# Patient Record
Sex: Male | Born: 1955
Health system: Southern US, Community
[De-identification: ages and names within clinical notes are randomized; demographics above are authoritative.]

## PROBLEM LIST (undated history)

## (undated) DIAGNOSIS — M009 Pyogenic arthritis, unspecified: Secondary | ICD-10-CM

## (undated) DIAGNOSIS — J069 Acute upper respiratory infection, unspecified: Secondary | ICD-10-CM

## (undated) DIAGNOSIS — E119 Type 2 diabetes mellitus without complications: Secondary | ICD-10-CM

## (undated) DIAGNOSIS — K219 Gastro-esophageal reflux disease without esophagitis: Secondary | ICD-10-CM

## (undated) DIAGNOSIS — F419 Anxiety disorder, unspecified: Secondary | ICD-10-CM

## (undated) DIAGNOSIS — M5432 Sciatica, left side: Secondary | ICD-10-CM

## (undated) DIAGNOSIS — G4730 Sleep apnea, unspecified: Secondary | ICD-10-CM

## (undated) DIAGNOSIS — S838X9A Sprain of other specified parts of unspecified knee, initial encounter: Secondary | ICD-10-CM

## (undated) DIAGNOSIS — I10 Essential (primary) hypertension: Secondary | ICD-10-CM

## (undated) DIAGNOSIS — C801 Malignant (primary) neoplasm, unspecified: Secondary | ICD-10-CM

## (undated) DIAGNOSIS — B999 Unspecified infectious disease: Secondary | ICD-10-CM

## (undated) DIAGNOSIS — R739 Hyperglycemia, unspecified: Secondary | ICD-10-CM

## (undated) DIAGNOSIS — M1990 Unspecified osteoarthritis, unspecified site: Secondary | ICD-10-CM

## (undated) HISTORY — PX: TONSILLECTOMY: SUR1361

## (undated) HISTORY — DX: Unspecified osteoarthritis, unspecified site: M19.90

## (undated) HISTORY — DX: Anxiety disorder, unspecified: F41.9

## (undated) HISTORY — PX: SKIN GRAFT: SHX250

## (undated) HISTORY — DX: Essential (primary) hypertension: I10

## (undated) HISTORY — PX: HAND SURGERY: SHX662

## (undated) HISTORY — DX: Malignant (primary) neoplasm, unspecified: C80.1

## (undated) HISTORY — DX: Gastro-esophageal reflux disease without esophagitis: K21.9

## (undated) HISTORY — PX: HERNIA REPAIR: SHX51

---

## 1999-09-26 ENCOUNTER — Emergency Department (HOSPITAL_COMMUNITY): Admission: EM | Admit: 1999-09-26 | Discharge: 1999-09-26 | Payer: Self-pay | Admitting: Emergency Medicine

## 1999-10-02 ENCOUNTER — Emergency Department (HOSPITAL_COMMUNITY): Admission: EM | Admit: 1999-10-02 | Discharge: 1999-10-02 | Payer: Self-pay | Admitting: Emergency Medicine

## 2005-06-18 DIAGNOSIS — C801 Malignant (primary) neoplasm, unspecified: Secondary | ICD-10-CM

## 2005-06-18 HISTORY — DX: Malignant (primary) neoplasm, unspecified: C80.1

## 2005-11-20 ENCOUNTER — Ambulatory Visit (HOSPITAL_BASED_OUTPATIENT_CLINIC_OR_DEPARTMENT_OTHER): Admission: RE | Admit: 2005-11-20 | Discharge: 2005-11-20 | Payer: Self-pay | Admitting: Surgery

## 2008-05-28 ENCOUNTER — Ambulatory Visit (HOSPITAL_BASED_OUTPATIENT_CLINIC_OR_DEPARTMENT_OTHER): Admission: RE | Admit: 2008-05-28 | Discharge: 2008-05-28 | Payer: Self-pay | Admitting: Orthopedic Surgery
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
## 2010-11-03 NOTE — Op Note (Signed)
NAMECESAREO, VICKREY                  ACCOUNT NO.:  [id number]   MEDICAL RECORD NO.:  [id number]          PATIENT TYPE:  AMB   LOCATION:  DSC                          FACILITY:  [facility]   PHYSICIAN:  Wilmon Arms. Corliss Skains, M.D. DATE OF BIRTH:  [birth year]   DATE OF PROCEDURE:  11/20/2005  DATE OF DISCHARGE:                                 OPERATIVE REPORT   PREOPERATIVE DIAGNOSIS:  Right inguinal hernia.   POSTOPERATIVE DIAGNOSIS:  Right inguinal hernia.   PROCEDURE PERFORMED:  Right inguinal hernia repair with mesh.   SURGEON:  Wilmon Arms. Tsuei, M.D.   ANESTHESIA:  General via LMA.   INDICATIONS:  The patient is a 55 year old male who was diagnosed several  years ago with a right inguinal hernia.  This was asymptomatic at the time.  Recently he has become more symptomatic.  He has a visible bulge causing  some discomfort especially when he is at work lifting boxes.  He presents  for surgical repair.   DESCRIPTION OF PROCEDURE:  The patient is brought to the operating room and  placed in the supine position on the operating table.  After an adequate  level of general anesthesia was obtained, the patient's right groin was  shaved, prepped with Betadine, and draped in a sterile fashion.  The skin  above the inguinal ligament was infiltrated with 0.25% Marcaine.  An oblique  incision was made above the inguinal ligament and dissection was carried  down through the external oblique fascia.  The external oblique fascia was  opened along the direction of its fibers down to the external ring.  Blunt  dissection was used to dissect around the spermatic cord.  A Penrose drain  was used to encircle the cord.  There was no direct defect noted.  The  spermatic cord was skeletonized.  A large indirect hernia sac was dissected  free from the spermatic cord and reduced up through the internal ring.  The  internal ring was tightened with a single interrupted 2-0 Vicryl suture.  A  key-hole mesh of  Prolene was cut and secured beginning at the pubic tubercle  with interrupted 2-0 Vicryls.  This was attached to the internal oblique  muscle superiorly and the shelving edge inferiorly.  The tails were tucked  underneath the edge of the external oblique fascia laterally and the tails  were sutured together.  The wound was then irrigated and the external  oblique fascia was closed with 2-0 Vicryl after removing the Penrose drain.  A 3-0 Vicryl was used to close the subcutaneous fat and 4-0 Monocryl was  used to close the skin in subcuticular fashion.  Steri-Strips and a clean  dressing were applied.  The patient was then extubated and brought to the  recovery room in stable condition.  All sponge, instrument, and needle  counts were correct.     Wilmon Arms. Tsuei, M.D.  Electronically Signed    MKT/MEDQ  D:  11/20/2005  T:  11/20/2005  Job:  [id number]   cc:   Michelene Gardener, MD

## 2013-04-27 ENCOUNTER — Encounter (INDEPENDENT_AMBULATORY_CARE_PROVIDER_SITE_OTHER): Payer: Self-pay | Admitting: Surgery

## 2013-04-29 ENCOUNTER — Ambulatory Visit (INDEPENDENT_AMBULATORY_CARE_PROVIDER_SITE_OTHER): Payer: 59 | Admitting: Surgery

## 2013-04-29 ENCOUNTER — Encounter (INDEPENDENT_AMBULATORY_CARE_PROVIDER_SITE_OTHER): Payer: Self-pay

## 2013-04-29 ENCOUNTER — Encounter (INDEPENDENT_AMBULATORY_CARE_PROVIDER_SITE_OTHER): Payer: Self-pay | Admitting: Surgery

## 2013-04-29 VITALS — BP 138/86 | HR 72 | Temp 98.6°F | Resp 15 | Ht 70.0 in | Wt 195.2 lb

## 2013-04-29 DIAGNOSIS — R221 Localized swelling, mass and lump, neck: Secondary | ICD-10-CM

## 2013-04-29 DIAGNOSIS — N888 Other specified noninflammatory disorders of cervix uteri: Secondary | ICD-10-CM | POA: Insufficient documentation

## 2013-04-29 DIAGNOSIS — R220 Localized swelling, mass and lump, head: Secondary | ICD-10-CM

## 2013-04-29 NOTE — Progress Notes (Signed)
General Surgery [facility]  Chief Complaint  Patient presents with  . New Evaluation    eval lump on rt side neck - referral from [facility] Panda    HISTORY: Patient is a 57 year old male referred by his primary care physician for evaluation of a right posterior cervical mass. Patient states that this has been present for several months. It has not increased in size. It does not cause discomfort. He has had no prior lesions of this type. He has had no diagnostic studies performed.  Family history notable for lymphoma in the patient's mother.  Past Medical History  Diagnosis Date  . Anxiety   . GERD (gastroesophageal reflux disease)   . Arthritis   . Cancer     melanoma  . Hypertension     Current Outpatient Prescriptions  Medication Sig Dispense Refill  . Aspirin-Salicylamide-Caffeine (BC HEADACHE POWDER PO) Take by mouth.      . calcium & magnesium carbonates (MYLANTA) 311-232 MG per tablet Take 1 tablet by mouth daily.      . citalopram (CELEXA) 20 MG tablet Take 20 mg by mouth daily.      Marland Kitchen gabapentin (NEURONTIN) 300 MG capsule Take 300 mg by mouth 3 (three) times daily.      Marland Kitchen losartan (COZAAR) 50 MG tablet Take 50 mg by mouth daily.      . Potassium Gluconate 595 MG CAPS Take by mouth.      . sildenafil (VIAGRA) 100 MG tablet Take 100 mg by mouth daily as needed for erectile dysfunction.      . traMADol (ULTRAM) 50 MG tablet Take by mouth every 6 (six) hours as needed.       No current facility-administered medications for this visit.    No Known Allergies  Family History  Problem Relation Age of Onset  . Cancer Mother     lymphnode  . Stroke Father     History   Social History  . Marital Status: Married    Spouse Name: N/A    Number of Children: N/A  . Years of Education: N/A   Social History Main Topics  . Smoking status: Current Every Day Smoker -- 0.50 packs/day  . Smokeless tobacco: Never Used  . Alcohol Use: Yes     Comment:  pt said all he can  . Drug Use: No  . Sexual Activity: None   Other Topics Concern  . None   Social History Narrative  . None    REVIEW OF SYSTEMS - PERTINENT POSITIVES ONLY: Mass in right posterior neck is not painful. It has not changed in size. There has been no sign or symptom of infection. Patient denies any B-type symptoms such as night sweats or weight loss.  EXAM: Filed Vitals:   04/29/13 0932  BP: 138/86  Pulse: 72  Temp: 98.6 F (37 C)  Resp: 15    HEENT: normocephalic; pupils equal and reactive; sclerae clear; dentition good; mucous membranes moist NECK:  Thyroid without palpable abnormality;symmetric on extension; no palpable anterior cervical lymphadenopathy; no supraclavicular masses; no tenderness; palpable soft tissue mass right posterior cervical chain approximately 2 cm in greatest diameter, mobile, nontender CHEST: clear to auscultation bilaterally without rales, rhonchi, or wheezes CARDIAC: regular rate and rhythm without significant murmur; peripheral pulses are full EXT:  non-tender without edema; no deformity NEURO: no gross focal deficits; no sign of tremor   LABORATORY RESULTS: See [facility] (CHL-Epic) for most recent results  RADIOLOGY RESULTS: See [facility] (CHL-Epic) for most recent results  IMPRESSION: Right posterior cervical soft tissue mass, 2 cm, possible abnormal lymph node  PLAN: I discussed the above findings at length with the patient and his significant other. I have recommended an ultrasound of the neck to evaluate the posterior cervical mass as well as the anterior neck and thyroid. I will contact him with the results of the study.  If the mass in question appears to be a lymph node, then I think it should be excised for definitive diagnosis. This can be performed as an outpatient surgical procedure. If this appears to be a lipoma or a subcutaneous cyst, then I think he can be observed as the patient is  asymptomatic.  We will arrange for the ultrasound study and contact the patient with the results.  Velora Heckler, MD, FACS General & Endocrine Surgery [facility]  Primary Care Physician: No primary provider on file.

## 2013-04-29 NOTE — Patient Instructions (Signed)
Lymphadenopathy °Lymphadenopathy means "disease of the lymph glands." But the term is usually used to describe swollen or enlarged lymph glands, also called lymph nodes. These are the bean-shaped organs found in many locations including the neck, underarm, and groin. Lymph glands are part of the immune system, which fights infections in your body. Lymphadenopathy can occur in just one area of the body, such as the neck, or it can be generalized, with lymph node enlargement in several areas. The nodes found in the neck are the most common sites of lymphadenopathy. °CAUSES  °When your immune system responds to germs (such as viruses or bacteria ), infection-fighting cells and fluid build up. This causes the glands to grow in size. This is usually not something to worry about. Sometimes, the glands themselves can become infected and inflamed. This is called lymphadenitis. °Enlarged lymph nodes can be caused by many diseases: °· Bacterial disease, such as strep throat or a skin infection. °· Viral disease, such as a common cold. °· Other germs, such as lyme disease, tuberculosis, or sexually transmitted diseases. °· Cancers, such as lymphoma (cancer of the lymphatic system) or leukemia (cancer of the white blood cells). °· Inflammatory diseases such as lupus or rheumatoid arthritis. °· Reactions to medications. °Many of the diseases above are rare, but important. This is why you should see your caregiver if you have lymphadenopathy. °SYMPTOMS  °· Swollen, enlarged lumps in the neck, back of the head or other locations. °· Tenderness. °· Warmth or redness of the skin over the lymph nodes. °· Fever. °DIAGNOSIS  °Enlarged lymph nodes are often near the source of infection. They can help healthcare providers diagnose your illness. For instance:  °· Swollen lymph nodes around the jaw might be caused by an infection in the mouth. °· Enlarged glands in the neck often signal a throat infection. °· Lymph nodes that are swollen  in more than one area often indicate an illness caused by a virus. °Your caregiver most likely will know what is causing your lymphadenopathy after listening to your history and examining you. Blood tests, x-rays or other tests may be needed. If the cause of the enlarged lymph node cannot be found, and it does not go away by itself, then a biopsy may be needed. Your caregiver will discuss this with you. °TREATMENT  °Treatment for your enlarged lymph nodes will depend on the cause. Many times the nodes will shrink to normal size by themselves, with no treatment. Antibiotics or other medicines may be needed for infection. Only take over-the-counter or prescription medicines for pain, discomfort or fever as directed by your caregiver. °HOME CARE INSTRUCTIONS  °Swollen lymph glands usually return to normal when the underlying medical condition goes away. If they persist, contact your health-care provider. He/she might prescribe antibiotics or other treatments, depending on the diagnosis. Take any medications exactly as prescribed. Keep any follow-up appointments made to check on the condition of your enlarged nodes.  °SEEK MEDICAL CARE IF:  °· Swelling lasts for more than two weeks. °· You have symptoms such as weight loss, night sweats, fatigue or fever that does not go away. °· The lymph nodes are hard, seem fixed to the skin or are growing rapidly. °· Skin over the lymph nodes is red and inflamed. This could mean there is an infection. °SEEK IMMEDIATE MEDICAL CARE IF:  °· Fluid starts leaking from the area of the enlarged lymph node. °· You develop a fever of 102° F (38.9° C) or greater. °· Severe   pain develops (not necessarily at the site of a large lymph node). °· You develop chest pain or shortness of breath. °· You develop worsening abdominal pain. °MAKE SURE YOU:  °· Understand these instructions. °· Will watch your condition. °· Will get help right away if you are not doing well or get worse. °Document  Released: 03/13/2008 Document Revised: 08/27/2011 Document Reviewed: 03/13/2008 °[employer]® Patient Information ©2014 [employer]. ° °

## 2013-04-30 ENCOUNTER — Ambulatory Visit
Admission: RE | Admit: 2013-04-30 | Discharge: 2013-04-30 | Disposition: A | Discharge status: Home / Self Care | Payer: 59 | Source: Ambulatory Visit | Attending: Surgery | Admitting: Surgery

## 2013-04-30 DIAGNOSIS — R221 Localized swelling, mass and lump, neck: Secondary | ICD-10-CM

## 2013-05-03 ENCOUNTER — Encounter (INDEPENDENT_AMBULATORY_CARE_PROVIDER_SITE_OTHER): Payer: Self-pay | Admitting: Surgery

## 2013-05-04 NOTE — Progress Notes (Signed)
Dr. Gerrit Friends - please enter preop orders in Epic -thanks.

## 2013-05-05 ENCOUNTER — Other Ambulatory Visit (INDEPENDENT_AMBULATORY_CARE_PROVIDER_SITE_OTHER): Payer: Self-pay | Admitting: Surgery

## 2013-05-12 ENCOUNTER — Telehealth (INDEPENDENT_AMBULATORY_CARE_PROVIDER_SITE_OTHER): Payer: Self-pay

## 2013-05-12 NOTE — Telephone Encounter (Signed)
I notified the pt.

## 2013-05-12 NOTE — Telephone Encounter (Signed)
OK with me.  Shouldn't have any effect on neck procedure.  Velora Heckler, MD, [facility] Office: [phone]

## 2013-05-12 NOTE — Telephone Encounter (Signed)
The pt called to see if he can get steroid injections in his back around the same time of his surgery 12/5.  Dr Gerrit Friends is removing a mass from his neck that day.  He said he can get the injections the day before or same day if ok with Dr Gerrit Friends.  You can leave him a message at [phone] if he doesn't pick up.

## 2013-05-13 ENCOUNTER — Encounter (HOSPITAL_COMMUNITY): Payer: Self-pay | Admitting: Pharmacy Technician

## 2013-05-13 ENCOUNTER — Other Ambulatory Visit (HOSPITAL_COMMUNITY): Payer: Self-pay | Admitting: *Deleted

## 2013-05-17 DIAGNOSIS — J069 Acute upper respiratory infection, unspecified: Secondary | ICD-10-CM

## 2013-05-17 HISTORY — DX: Acute upper respiratory infection, unspecified: J06.9

## 2013-05-18 ENCOUNTER — Inpatient Hospital Stay (HOSPITAL_COMMUNITY): Admission: RE | Admit: 2013-05-18 | Payer: 59 | Source: Ambulatory Visit

## 2013-05-18 ENCOUNTER — Telehealth (INDEPENDENT_AMBULATORY_CARE_PROVIDER_SITE_OTHER): Payer: Self-pay | Admitting: *Deleted

## 2013-05-18 NOTE — Telephone Encounter (Signed)
Pt called stating that he woke up yesterday with a sinus infection and went to the doctor and is currently taking amoxicillin and a decongestant.  He is not feeling any better yet so he is concerned and wondering if his surgery scheduled for 12/5 with Dr. Gerrit Friends should be rescheduled.  I informed him that I would check with Dr. Gerrit Friends and we would get back in touch with him.  Please advise.

## 2013-05-19 NOTE — Telephone Encounter (Signed)
If patient on abx, should be OK for surgery by Friday 12/5.  Tell patient to come as scheduled and anesthesia will assess.  Velora Heckler, MD, [facility] Office: [phone]

## 2013-05-19 NOTE — Telephone Encounter (Signed)
Spoke with pt and informed him that surgery is OK and still scheduled for 12/5.  I also called pre-op at [facility] and they will be contacting pt today to rescheduled his pre-op testing since he missed his appt yesterday.  Pt is agreeable with this plan and is feeling much better today!

## 2013-05-20 NOTE — Patient Instructions (Addendum)
20 Adam Tyler  05/20/2013   Your procedure is scheduled on:  05/22/13 FRIDAY  Report to [facility] at   0930    AM.  Call this number if you have problems the morning of surgery: [phone]       Remember:   Do not eat food  Or drink :After Midnight. TONIGHT   Take these medicines the morning of surgery with A SIP OF WATER: Citalopram, Gabapentin                                                                     May take Tramadol if needed   .  Contacts, dentures or partial plates can not be worn to surgery  Leave suitcase in the car. After surgery it may be brought to your room.  For patients admitted to the hospital, checkout time is 11:00 AM day of  discharge.             SPECIAL INSTRUCTIONS- SEE [place] PREPARING FOR SURGERY INSTRUCTION SHEET-     DO NOT WEAR JEWELRY, LOTIONS, POWDERS, OR PERFUMES.  WOMEN-- DO NOT SHAVE LEGS OR UNDERARMS FOR 12 HOURS BEFORE SHOWERS. MEN MAY SHAVE FACE.  Patients discharged the day of surgery will not be allowed to drive home. IF going home the day of surgery, you must have a driver and someone to stay with you for the first 24 hours  Name and phone number of your driver:   Delman Cheadle  PST [phone]  [phone]                 FAILURE TO FOLLOW THESE INSTRUCTIONS MAY RESULT IN  CANCELLATION   OF YOUR SURGERY                                                  Patient Signature _____________________________

## 2013-05-21 ENCOUNTER — Encounter (HOSPITAL_COMMUNITY)
Admission: RE | Admit: 2013-05-21 | Discharge: 2013-05-21 | Disposition: A | Discharge status: Home / Self Care | Payer: 59 | Source: Ambulatory Visit | Attending: Surgery | Admitting: Surgery

## 2013-05-21 ENCOUNTER — Encounter (HOSPITAL_COMMUNITY): Payer: Self-pay

## 2013-05-21 ENCOUNTER — Ambulatory Visit (HOSPITAL_COMMUNITY)
Admission: RE | Admit: 2013-05-21 | Discharge: 2013-05-21 | Disposition: A | Discharge status: Home / Self Care | Payer: 59 | Source: Ambulatory Visit | Attending: Surgery | Admitting: Surgery

## 2013-05-21 ENCOUNTER — Encounter (HOSPITAL_COMMUNITY): Payer: Self-pay | Admitting: Pharmacy Technician

## 2013-05-21 DIAGNOSIS — Z01812 Encounter for preprocedural laboratory examination: Secondary | ICD-10-CM | POA: Insufficient documentation

## 2013-05-21 DIAGNOSIS — Z01810 Encounter for preprocedural cardiovascular examination: Secondary | ICD-10-CM | POA: Insufficient documentation

## 2013-05-21 DIAGNOSIS — Z01818 Encounter for other preprocedural examination: Secondary | ICD-10-CM | POA: Insufficient documentation

## 2013-05-21 HISTORY — DX: Sciatica, left side: M54.32

## 2013-05-21 HISTORY — DX: Acute upper respiratory infection, unspecified: J06.9

## 2013-05-21 LAB — CBC
HCT: 43.7 % (ref 39.0–52.0)
MCH: 30.7 pg (ref 26.0–34.0)
MCV: 89.9 fL (ref 78.0–100.0)
RBC: 4.86 MIL/uL (ref 4.22–5.81)
WBC: 8.1 10*3/uL (ref 4.0–10.5)

## 2013-05-21 LAB — BASIC METABOLIC PANEL
CO2: 25 mEq/L (ref 19–32)
Chloride: 100 mEq/L (ref 96–112)
Potassium: 4.4 mEq/L (ref 3.5–5.1)
Sodium: 136 mEq/L (ref 135–145)

## 2013-05-21 NOTE — Progress Notes (Signed)
Quick Note:  These results are acceptable for scheduled surgery.  [name], MD, FACS [facility] Office: [phone]   ______ 

## 2013-05-21 NOTE — Progress Notes (Signed)
Patient stated at PST visit has been on Amoxicillin and URI is improving- states no fever since mONDAY

## 2013-05-22 ENCOUNTER — Ambulatory Visit (HOSPITAL_COMMUNITY): Admission: RE | Admit: 2013-05-22 | Payer: 59 | Source: Ambulatory Visit | Admitting: Surgery

## 2013-05-22 ENCOUNTER — Encounter (HOSPITAL_COMMUNITY): Admission: RE | Payer: Self-pay | Source: Ambulatory Visit

## 2013-05-22 SURGERY — THYROIDECTOMY
Anesthesia: General | Laterality: Right

## 2013-05-29 NOTE — Progress Notes (Signed)
Spoke with patient by phone and patient still has preop instructions for surgery that was scheduled for 05/22/13.  Instructed patient to arrive at 0900am on 06/19/2013.  Surgery will be at 1130am.  Report to Short Stay and bring Insurance Card and Picture ID.  Patient aware to follow hibiclens instructions for 12/31 pm and 06/18/2013 pm prior to surgery.  Patient aware to follow preop instructions previously given regarding npo after midnite and what medications to take am of surgery.  Patient voiced understanding.  Patient stated medications nor medical history have changed since preop appointment of 05/21/13.

## 2013-06-05 ENCOUNTER — Other Ambulatory Visit: Payer: Self-pay | Admitting: Neurosurgery

## 2013-06-05 DIAGNOSIS — M5416 Radiculopathy, lumbar region: Secondary | ICD-10-CM

## 2013-06-15 ENCOUNTER — Ambulatory Visit
Admission: RE | Admit: 2013-06-15 | Discharge: 2013-06-15 | Disposition: A | Discharge status: Home / Self Care | Payer: 59 | Source: Ambulatory Visit | Attending: Neurosurgery | Admitting: Neurosurgery

## 2013-06-15 VITALS — BP 127/80 | HR 72

## 2013-06-15 DIAGNOSIS — M5416 Radiculopathy, lumbar region: Secondary | ICD-10-CM

## 2013-06-15 DIAGNOSIS — N888 Other specified noninflammatory disorders of cervix uteri: Secondary | ICD-10-CM

## 2013-06-15 MED ORDER — IOHEXOL 180 MG/ML  SOLN
18.0000 mL | Freq: Once | INTRAMUSCULAR | Status: AC | PRN
  Administered 2013-06-15: 18 mL via INTRATHECAL

## 2013-06-15 MED ORDER — ONDANSETRON HCL 4 MG/2ML IJ SOLN: 4.0000 mg | Freq: Four times a day (QID) | INTRAMUSCULAR | Status: DC | PRN

## 2013-06-15 MED ORDER — DIAZEPAM 5 MG PO TABS
10.0000 mg | ORAL_TABLET | Freq: Once | ORAL | Status: AC
  Administered 2013-06-15: 10 mg via ORAL

## 2013-06-15 NOTE — Progress Notes (Signed)
Pt states he has been off celexa and tramadol for the past 2 days. Discharge instructions explained to pt and his wife

## 2013-06-17 ENCOUNTER — Encounter (HOSPITAL_COMMUNITY): Payer: Self-pay | Admitting: Pharmacy Technician

## 2013-06-19 ENCOUNTER — Encounter (HOSPITAL_COMMUNITY)
Admission: RE | Disposition: A | Discharge status: Home / Self Care | Payer: Self-pay | Source: Ambulatory Visit | Attending: Surgery

## 2013-06-19 ENCOUNTER — Encounter (HOSPITAL_COMMUNITY): Payer: 59 | Admitting: Certified Registered Nurse Anesthetist

## 2013-06-19 ENCOUNTER — Ambulatory Visit (HOSPITAL_COMMUNITY): Payer: 59 | Admitting: Certified Registered Nurse Anesthetist

## 2013-06-19 ENCOUNTER — Ambulatory Visit (HOSPITAL_COMMUNITY)
Admission: RE | Admit: 2013-06-19 | Discharge: 2013-06-19 | Disposition: A | Discharge status: Home / Self Care | Payer: 59 | Source: Ambulatory Visit | Attending: Surgery | Admitting: Surgery

## 2013-06-19 ENCOUNTER — Encounter (HOSPITAL_COMMUNITY): Payer: Self-pay | Admitting: *Deleted

## 2013-06-19 DIAGNOSIS — I10 Essential (primary) hypertension: Secondary | ICD-10-CM | POA: Insufficient documentation

## 2013-06-19 DIAGNOSIS — Z79899 Other long term (current) drug therapy: Secondary | ICD-10-CM | POA: Insufficient documentation

## 2013-06-19 DIAGNOSIS — N888 Other specified noninflammatory disorders of cervix uteri: Secondary | ICD-10-CM | POA: Diagnosis present

## 2013-06-19 DIAGNOSIS — Z85820 Personal history of malignant melanoma of skin: Secondary | ICD-10-CM | POA: Insufficient documentation

## 2013-06-19 DIAGNOSIS — Z7982 Long term (current) use of aspirin: Secondary | ICD-10-CM | POA: Insufficient documentation

## 2013-06-19 DIAGNOSIS — R599 Enlarged lymph nodes, unspecified: Secondary | ICD-10-CM | POA: Insufficient documentation

## 2013-06-19 DIAGNOSIS — D1779 Benign lipomatous neoplasm of other sites: Secondary | ICD-10-CM | POA: Insufficient documentation

## 2013-06-19 DIAGNOSIS — D1739 Benign lipomatous neoplasm of skin and subcutaneous tissue of other sites: Secondary | ICD-10-CM

## 2013-06-19 DIAGNOSIS — F17200 Nicotine dependence, unspecified, uncomplicated: Secondary | ICD-10-CM | POA: Insufficient documentation

## 2013-06-19 DIAGNOSIS — K219 Gastro-esophageal reflux disease without esophagitis: Secondary | ICD-10-CM | POA: Insufficient documentation

## 2013-06-19 HISTORY — PX: MASS EXCISION: SHX2000

## 2013-06-19 SURGERY — EXCISION MASS
Anesthesia: General | Site: Neck | Laterality: Right

## 2013-06-19 MED ORDER — MIDAZOLAM HCL 2 MG/2ML IJ SOLN
INTRAMUSCULAR | Status: AC
  Filled 2013-06-19: qty 2

## 2013-06-19 MED ORDER — LACTATED RINGERS IV SOLN
INTRAVENOUS | Status: DC
Start: 2013-06-19 — End: 2013-06-19
  Administered 2013-06-19: 12:00:00 via INTRAVENOUS

## 2013-06-19 MED ORDER — FENTANYL CITRATE 0.05 MG/ML IJ SOLN
INTRAMUSCULAR | Status: DC | PRN
  Administered 2013-06-19 (×2): 50 ug via INTRAVENOUS

## 2013-06-19 MED ORDER — ONDANSETRON HCL 4 MG/2ML IJ SOLN
INTRAMUSCULAR | Status: AC
  Filled 2013-06-19: qty 2

## 2013-06-19 MED ORDER — PROPOFOL 10 MG/ML IV BOLUS
INTRAVENOUS | Status: AC
  Filled 2013-06-19: qty 20

## 2013-06-19 MED ORDER — CEFAZOLIN SODIUM-DEXTROSE 2-3 GM-% IV SOLR
2.0000 g | INTRAVENOUS | Status: AC
  Administered 2013-06-19: 2 g via INTRAVENOUS

## 2013-06-19 MED ORDER — OXYCODONE-ACETAMINOPHEN 5-325 MG PO TABS: 1.0000 | ORAL_TABLET | ORAL | Status: DC | PRN

## 2013-06-19 MED ORDER — HYDROMORPHONE HCL PF 1 MG/ML IJ SOLN
0.2500 mg | INTRAMUSCULAR | Status: DC | PRN
  Administered 2013-06-19 (×2): 0.5 mg via INTRAVENOUS

## 2013-06-19 MED ORDER — LIDOCAINE HCL (PF) 2 % IJ SOLN
INTRAMUSCULAR | Status: DC | PRN
  Administered 2013-06-19: 80 mg via INTRADERMAL

## 2013-06-19 MED ORDER — HYDROMORPHONE HCL PF 1 MG/ML IJ SOLN
INTRAMUSCULAR | Status: AC
  Filled 2013-06-19: qty 1

## 2013-06-19 MED ORDER — FENTANYL CITRATE 0.05 MG/ML IJ SOLN: 25.0000 ug | INTRAMUSCULAR | Status: DC | PRN

## 2013-06-19 MED ORDER — PROPOFOL 10 MG/ML IV BOLUS
INTRAVENOUS | Status: DC | PRN
  Administered 2013-06-19: 200 mg via INTRAVENOUS

## 2013-06-19 MED ORDER — ONDANSETRON HCL 4 MG/2ML IJ SOLN
INTRAMUSCULAR | Status: DC | PRN
  Administered 2013-06-19: 4 mg via INTRAVENOUS

## 2013-06-19 MED ORDER — PROMETHAZINE HCL 25 MG/ML IJ SOLN: 6.2500 mg | INTRAMUSCULAR | Status: DC | PRN

## 2013-06-19 MED ORDER — 0.9 % SODIUM CHLORIDE (POUR BTL) OPTIME
TOPICAL | Status: DC | PRN
  Administered 2013-06-19: 1000 mL

## 2013-06-19 MED ORDER — FENTANYL CITRATE 0.05 MG/ML IJ SOLN
INTRAMUSCULAR | Status: AC
  Filled 2013-06-19: qty 5

## 2013-06-19 MED ORDER — CEFAZOLIN SODIUM-DEXTROSE 2-3 GM-% IV SOLR
INTRAVENOUS | Status: AC
  Filled 2013-06-19: qty 50

## 2013-06-19 MED ORDER — MIDAZOLAM HCL 5 MG/5ML IJ SOLN
INTRAMUSCULAR | Status: DC | PRN
  Administered 2013-06-19: 2 mg via INTRAVENOUS

## 2013-06-19 MED ORDER — SUCCINYLCHOLINE CHLORIDE 20 MG/ML IJ SOLN
INTRAMUSCULAR | Status: DC | PRN
  Administered 2013-06-19: 100 mg via INTRAVENOUS

## 2013-06-19 MED ORDER — LIDOCAINE HCL (CARDIAC) 20 MG/ML IV SOLN
INTRAVENOUS | Status: AC
  Filled 2013-06-19: qty 5

## 2013-06-19 SURGICAL SUPPLY — 40 items
ADH SKN CLS APL DERMABOND .7 (GAUZE/BANDAGES/DRESSINGS) ×1
APL SKNCLS STERI-STRIP NONHPOA (GAUZE/BANDAGES/DRESSINGS) ×1
ATTRACTOMAT 16X20 MAGNETIC DRP (DRAPES) ×2 IMPLANT
BENZOIN TINCTURE PRP APPL 2/3 (GAUZE/BANDAGES/DRESSINGS) ×2 IMPLANT
BLADE HEX COATED 2.75 (ELECTRODE) ×2 IMPLANT
BLADE SURG 15 STRL LF DISP TIS (BLADE) ×1 IMPLANT
BLADE SURG 15 STRL SS (BLADE) ×2
CANISTER SUCTION 2500CC (MISCELLANEOUS) ×2 IMPLANT
CHLORAPREP W/TINT 10.5 ML (MISCELLANEOUS) ×2 IMPLANT
CLIP TI MEDIUM 6 (CLIP) ×2 IMPLANT
CLIP TI WIDE RED SMALL 6 (CLIP) ×2 IMPLANT
DERMABOND ADVANCED (GAUZE/BANDAGES/DRESSINGS) ×1
DERMABOND ADVANCED .7 DNX12 (GAUZE/BANDAGES/DRESSINGS) IMPLANT
DISSECTOR ROUND CHERRY 3/8 STR (MISCELLANEOUS) IMPLANT
DRAPE PED LAPAROTOMY (DRAPES) ×2 IMPLANT
DRESSING SURGICEL FIBRLLR 1X2 (HEMOSTASIS) ×1 IMPLANT
DRSG SURGICEL FIBRILLAR 1X2 (HEMOSTASIS)
ELECT REM PT RETURN 9FT ADLT (ELECTROSURGICAL) ×2
ELECTRODE REM PT RTRN 9FT ADLT (ELECTROSURGICAL) ×1 IMPLANT
GAUZE SPONGE 4X4 16PLY XRAY LF (GAUZE/BANDAGES/DRESSINGS) ×2 IMPLANT
GLOVE SURG ORTHO 8.0 STRL STRW (GLOVE) ×2 IMPLANT
GOWN PREVENTION PLUS LG XLONG (DISPOSABLE) ×2 IMPLANT
GOWN STRL REIN XL XLG (GOWN DISPOSABLE) ×4 IMPLANT
KIT BASIN OR (CUSTOM PROCEDURE TRAY) ×2 IMPLANT
NS IRRIG 1000ML POUR BTL (IV SOLUTION) ×2 IMPLANT
PACK BASIC VI WITH GOWN DISP (CUSTOM PROCEDURE TRAY) ×2 IMPLANT
PENCIL BUTTON HOLSTER BLD 10FT (ELECTRODE) ×2 IMPLANT
SHEARS HARMONIC 9CM CVD (BLADE) ×1 IMPLANT
SPONGE GAUZE 4X4 12PLY (GAUZE/BANDAGES/DRESSINGS) IMPLANT
STAPLER VISISTAT 35W (STAPLE) ×1 IMPLANT
STRIP CLOSURE SKIN 1/2X4 (GAUZE/BANDAGES/DRESSINGS) ×2 IMPLANT
SUT MNCRL AB 4-0 PS2 18 (SUTURE) ×2 IMPLANT
SUT SILK 2 0 (SUTURE) ×2
SUT SILK 2-0 18XBRD TIE 12 (SUTURE) ×1 IMPLANT
SUT SILK 3 0 (SUTURE)
SUT SILK 3-0 18XBRD TIE 12 (SUTURE) IMPLANT
SUT VIC AB 3-0 SH 18 (SUTURE) ×2 IMPLANT
SYR BULB IRRIGATION 50ML (SYRINGE) ×2 IMPLANT
TOWEL OR 17X26 10 PK STRL BLUE (TOWEL DISPOSABLE) ×2 IMPLANT
YANKAUER SUCT BULB TIP 10FT TU (MISCELLANEOUS) ×2 IMPLANT

## 2013-06-19 NOTE — Anesthesia Postprocedure Evaluation (Signed)
  Anesthesia Post-op Note  Patient: Adam Tyler  Procedure(s) Performed: Procedure(s) (LRB): EXCISION MASS RIGHT POSTERIOR NECK (Right)  Patient Location: PACU  Anesthesia Type: General  Level of Consciousness: awake and alert   Airway and Oxygen Therapy: Patient Spontanous Breathing  Post-op Pain: mild  Post-op Assessment: Post-op Vital signs reviewed, Patient's Cardiovascular Status Stable, Respiratory Function Stable, Patent Airway and No signs of Nausea or vomiting  Last Vitals:  Filed Vitals:   06/19/13 1430  BP: 124/63  Pulse: 77  Temp: 36.4 C  Resp: 13    Post-op Vital Signs: stable   Complications: No apparent anesthesia complications

## 2013-06-19 NOTE — H&P (Signed)
Adam Tyler is an 58 y.o. male.    General Surgery [facility]  Chief Complaint: right posterior cervical mass  HPI: Patient is a 58 year old male referred by his primary care physician for evaluation of a right posterior cervical mass. Patient states that this has been present for several months. It has not increased in size. It does not cause discomfort. He has had no prior lesions of this type. He has had no diagnostic studies performed. Patient now presents for excision for definitive diagnosis.  Past Medical History  Diagnosis Date  . GERD (gastroesophageal reflux disease)   . Arthritis   . Hypertension   . Upper respiratory infection 05/17/13    no fever since 05/18/13  . Cancer 2007    melanoma forehead   . Anxiety     h/o panic attacks from " stress"  . Sciatica of left side     per pt  "buldging disc- lumbar"    Past Surgical History  Procedure Laterality Date  . Hernia repair    . Tonsillectomy    . Hand surgery    . Skin graft Right     Family History  Problem Relation Age of Onset  . Cancer Mother     lymphnode  . Stroke Father    Social History:  reports that he has been smoking.  He has never used smokeless tobacco. He reports that he drinks alcohol. He reports that he does not use illicit drugs.  Allergies: No Known Allergies  Medications Prior to Admission  Medication Sig Dispense Refill  . citalopram (CELEXA) 20 MG tablet Take 20 mg by mouth every morning.       Marland Kitchen ibuprofen (ADVIL,MOTRIN) 200 MG tablet Take 600 mg by mouth every 6 (six) hours as needed for mild pain or moderate pain.      Marland Kitchen losartan-hydrochlorothiazide (HYZAAR) 50-12.5 MG per tablet Take 1 tablet by mouth every morning.      Marland Kitchen oxymetazoline (AFRIN) 0.05 % nasal spray Place 1 spray into both nostrils 2 (two) times daily as needed for congestion.      . Potassium Gluconate 595 MG CAPS Take 1 capsule by mouth every evening.       . traMADol (ULTRAM) 50 MG tablet Take  50 mg by mouth every 6 (six) hours as needed for moderate pain or severe pain.       . Aspirin-Salicylamide-Caffeine (BC HEADACHE POWDER PO) Take 1 each by mouth daily as needed (headache).       Marland Kitchen dextromethorphan-guaiFENesin (MUCINEX DM) 30-600 MG per 12 hr tablet Take 1 tablet by mouth 2 (two) times daily as needed for cough.       . gabapentin (NEURONTIN) 300 MG capsule Take 300 mg by mouth daily.         No results found for this or any previous visit (from the past 48 hour(s)). No results found.  Review of Systems  Constitutional: Negative.   HENT: Negative.   Eyes: Negative.   Respiratory: Negative.   Cardiovascular: Negative.   Gastrointestinal: Negative.   Genitourinary: Negative.   Musculoskeletal: Negative.   Skin: Negative.   Neurological: Negative.   Endo/Heme/Allergies: Negative.   Psychiatric/Behavioral: Negative.     Blood pressure 142/87, pulse 94, temperature 98.2 F (36.8 C), temperature source Oral, resp. rate 18, height 5\' 10"  (1.778 m), weight 202 lb 8 oz (91.853 kg), SpO2 97.00%. Physical Exam  Constitutional: He is oriented to person, place, and time. He appears well-developed  and well-nourished. No distress.  HENT:  Head: Normocephalic and atraumatic.  Right Ear: External ear normal.  Left Ear: External ear normal.  Eyes: Conjunctivae are normal. Pupils are equal, round, and reactive to light. No scleral icterus.  Neck: Normal range of motion. Neck supple. No tracheal deviation present. Thyromegaly present.  Cardiovascular: Normal rate, regular rhythm and normal heart sounds.   Respiratory: Effort normal and breath sounds normal. He has no wheezes.  GI: Soft. Bowel sounds are normal. He exhibits no distension.  Musculoskeletal: Normal range of motion. He exhibits no edema.  Lymphadenopathy:    He has cervical adenopathy (right posterior cervical mass, approx 2 cm).  Neurological: He is alert and oriented to person, place, and time.  Skin: Skin is warm  and dry.  Psychiatric: He has a normal mood and affect. His behavior is normal.     Assessment/Plan Right posterior cervical mass, 2 cm  Plan excision for definitive diagnosis  Earnstine Regal, MD, [facility] Office: [place] 06/19/2013, 12:48 PM

## 2013-06-19 NOTE — Brief Op Note (Signed)
06/19/2013  1:32 PM  PATIENT:  Adam Tyler  58 y.o. male  PRE-OPERATIVE DIAGNOSIS:  mass right posterior neck  POST-OPERATIVE DIAGNOSIS:  mass right posterior neck  PROCEDURE:  Procedure(s): EXCISION MASS RIGHT POSTERIOR NECK (Right)  SURGEON:  Surgeon(s) and Role:    * Earnstine Regal, MD - Primary  ANESTHESIA:   general  EBL:     BLOOD ADMINISTERED:none  DRAINS: none   LOCAL MEDICATIONS USED:  NONE  SPECIMEN:  Excision  DISPOSITION OF SPECIMEN:  PATHOLOGY  COUNTS:  YES  TOURNIQUET:  * No tourniquets in log *  DICTATION: .Other Dictation: Dictation Number [id number]  PLAN OF CARE: Discharge to home after PACU  PATIENT DISPOSITION:  PACU - hemodynamically stable.   Delay start of Pharmacological VTE agent (>24hrs) due to surgical blood loss or risk of bleeding: yes  Earnstine Regal, MD, [facility] Office: [phone]

## 2013-06-19 NOTE — Transfer of Care (Signed)
Immediate Anesthesia Transfer of Care Note  Patient: Adam Tyler  Procedure(s) Performed: Procedure(s): EXCISION MASS RIGHT POSTERIOR NECK (Right)  Patient Location: PACU  Anesthesia Type:General  Level of Consciousness: awake, alert  and oriented  Airway & Oxygen Therapy: Patient Spontanous Breathing and Patient connected to face mask oxygen  Post-op Assessment: Report given to PACU RN, Post -op Vital signs reviewed and stable and Patient moving all extremities X 4  Post vital signs: Reviewed and stable  Complications: No apparent anesthesia complications

## 2013-06-19 NOTE — Op Note (Signed)
Adam Tyler, Adam Tyler                  ACCOUNT NO.:  [id number]  MEDICAL RECORD NO.:  [id number]  LOCATION:  [facility]                         FACILITY:  [facility]  PHYSICIAN:  Earnstine Regal, MD      DATE OF BIRTH:  20-Oct-1955  DATE OF PROCEDURE:  06/19/2013                               OPERATIVE REPORT   PREOPERATIVE DIAGNOSIS:  Right posterior cervical neck mass (2 cm).  POSTOPERATIVE DIAGNOSIS:  Right posterior cervical neck mass (2 cm).  PROCEDURE:  Excision right posterior cervical neck mass (2 cm).  SURGEON:  Earnstine Regal, MD, FACS  ANESTHESIA:  General per Dr. Myrtie Soman.  ESTIMATED BLOOD LOSS:  Minimal.  PREPARATION:  ChloraPrep.  COMPLICATIONS:  None.  INDICATIONS:  The patient is a 58 year old male referred by his primary care provider for evaluation of a persistent right posterior cervical neck mass.  Ultrasound examination showed an indeterminate soft tissue mass in the posterior cervical region measuring nearly 2 cm in size.  No other significant adenopathy was identified.  The patient now comes to Surgery for excision for definitive diagnosis.  BODY OF REPORT:  Procedure was done in OR #1 at the [facility]  The patient was brought to the operating room, placed in a supine position on the operating room table.  Following administration of general anesthesia, the patient was positioned and then prepped and draped in the usual aseptic fashion.  After ascertaining that an adequate level of anesthesia had been achieved, a 2 cm incision was made over the soft tissue mass in the right posterior cervical region just anterior to the edge of the trapezius muscle. Dissection was carried through the skin and superficial subcutaneous tissues.  There was a rounded relatively firm mobile mass consistent with what was seen on ultrasound examination.  This was gently dissected out, and has the appearance of a lipoma.  The entire mass was excised using the  electrocautery for hemostasis.  The mass was submitted to Pathology for review.  Good hemostasis was achieved throughout the wound.  Subcutaneous tissues were closed with interrupted 3-0 Vicryl sutures.  Skin was closed with a running 4-0 Monocryl subcuticular suture.  Wound was washed and dried and Dermabond was applied.  The patient was awakened from anesthesia and brought to the recovery room.  The patient tolerated the procedure well.   Earnstine Regal, MD, [facility] Office: [phone]    TMG/MEDQ  D:  06/19/2013  T:  06/19/2013  Job:  [id number]  cc:   Everardo Beals, NP Fax: [phone]

## 2013-06-19 NOTE — Anesthesia Preprocedure Evaluation (Signed)
Anesthesia Evaluation  Patient identified by MRN, date of birth, ID band Patient awake    Reviewed: Allergy & Precautions, H&P , NPO status , Patient's Chart, lab work & pertinent test results  Airway Mallampati: II TM Distance: >3 FB Neck ROM: Full    Dental no notable dental hx.    Pulmonary Current Smoker,  breath sounds clear to auscultation  Pulmonary exam normal       Cardiovascular hypertension, Pt. on medications Rhythm:Regular Rate:Normal     Neuro/Psych negative neurological ROS  negative psych ROS   GI/Hepatic Neg liver ROS, GERD-  Poorly Controlled,  Endo/Other  negative endocrine ROS  Renal/GU negative Renal ROS  negative genitourinary   Musculoskeletal negative musculoskeletal ROS (+)   Abdominal   Peds negative pediatric ROS (+)  Hematology negative hematology ROS (+)   Anesthesia Other Findings   Reproductive/Obstetrics negative OB ROS                           Anesthesia Physical Anesthesia Plan  ASA: II  Anesthesia Plan: General   Post-op Pain Management:    Induction: Intravenous  Airway Management Planned: Oral ETT  Additional Equipment:   Intra-op Plan:   Post-operative Plan: Extubation in OR  Informed Consent: I have reviewed the patients History and Physical, chart, labs and discussed the procedure including the risks, benefits and alternatives for the proposed anesthesia with the patient or authorized representative who has indicated his/her understanding and acceptance.   Dental advisory given  Plan Discussed with: CRNA and Surgeon  Anesthesia Plan Comments:         Anesthesia Quick Evaluation

## 2013-06-22 ENCOUNTER — Encounter (INDEPENDENT_AMBULATORY_CARE_PROVIDER_SITE_OTHER): Payer: Self-pay | Admitting: Surgery

## 2013-06-22 ENCOUNTER — Encounter (HOSPITAL_COMMUNITY): Payer: Self-pay | Admitting: Surgery

## 2013-06-22 NOTE — Progress Notes (Signed)
Quick Note:  Please contact patient and notify of benign pathology results.  Priyansh Pry M. Rhylin Venters, MD, FACS [facility] Office: [phone]   ______ 

## 2013-06-23 ENCOUNTER — Encounter (INDEPENDENT_AMBULATORY_CARE_PROVIDER_SITE_OTHER): Payer: Self-pay | Admitting: Surgery

## 2013-06-24 ENCOUNTER — Telehealth (INDEPENDENT_AMBULATORY_CARE_PROVIDER_SITE_OTHER): Payer: Self-pay

## 2013-06-24 NOTE — Telephone Encounter (Signed)
Via epic my chart pt notified of path result and to call front desk to r/s appt if date or time is not good.

## 2013-07-03 ENCOUNTER — Encounter (INDEPENDENT_AMBULATORY_CARE_PROVIDER_SITE_OTHER): Payer: Self-pay | Admitting: Surgery

## 2013-07-08 ENCOUNTER — Encounter (INDEPENDENT_AMBULATORY_CARE_PROVIDER_SITE_OTHER): Payer: Self-pay | Admitting: Surgery

## 2013-07-08 ENCOUNTER — Telehealth: Payer: Self-pay | Admitting: Surgery

## 2013-07-08 DIAGNOSIS — N888 Other specified noninflammatory disorders of cervix uteri: Secondary | ICD-10-CM

## 2013-07-08 NOTE — Telephone Encounter (Signed)
Mr. Castilleja:  I hope you continue to do well after your out-patient procedure.  Final pathology shows a benign lipoma.  Please contact me if I may be of any further assistance.  Earnstine Regal, MD, [facility] Office: [phone]

## 2013-07-10 ENCOUNTER — Encounter (INDEPENDENT_AMBULATORY_CARE_PROVIDER_SITE_OTHER): Payer: 59 | Admitting: Surgery

## 2015-03-02 ENCOUNTER — Encounter (HOSPITAL_BASED_OUTPATIENT_CLINIC_OR_DEPARTMENT_OTHER): Payer: Self-pay | Admitting: *Deleted

## 2015-03-07 ENCOUNTER — Ambulatory Visit: Payer: Self-pay | Admitting: Physician Assistant

## 2015-03-07 ENCOUNTER — Other Ambulatory Visit: Payer: Self-pay

## 2015-03-07 ENCOUNTER — Encounter (HOSPITAL_BASED_OUTPATIENT_CLINIC_OR_DEPARTMENT_OTHER)
Admission: RE | Admit: 2015-03-07 | Discharge: 2015-03-07 | Disposition: A | Discharge status: Home / Self Care | Payer: Commercial Managed Care - HMO | Source: Ambulatory Visit | Attending: Orthopedic Surgery | Admitting: Orthopedic Surgery

## 2015-03-07 DIAGNOSIS — X58XXXA Exposure to other specified factors, initial encounter: Secondary | ICD-10-CM | POA: Diagnosis not present

## 2015-03-07 DIAGNOSIS — I10 Essential (primary) hypertension: Secondary | ICD-10-CM | POA: Diagnosis not present

## 2015-03-07 DIAGNOSIS — M1712 Unilateral primary osteoarthritis, left knee: Secondary | ICD-10-CM | POA: Diagnosis not present

## 2015-03-07 DIAGNOSIS — M94262 Chondromalacia, left knee: Secondary | ICD-10-CM | POA: Diagnosis not present

## 2015-03-07 DIAGNOSIS — S83242A Other tear of medial meniscus, current injury, left knee, initial encounter: Secondary | ICD-10-CM | POA: Diagnosis present

## 2015-03-07 LAB — BASIC METABOLIC PANEL
ANION GAP: 8 (ref 5–15)
BUN: 11 mg/dL (ref 6–20)
CALCIUM: 9.5 mg/dL (ref 8.9–10.3)
CHLORIDE: 106 mmol/L (ref 101–111)
CO2: 25 mmol/L (ref 22–32)
Creatinine, Ser: 0.77 mg/dL (ref 0.61–1.24)
GFR calc non Af Amer: >60 mL/min (ref >60)
Glucose, Bld: 164 mg/dL — ABNORMAL HIGH (ref 65–99)
Potassium: 3.9 mmol/L (ref 3.5–5.1)
SODIUM: 139 mmol/L (ref 135–145)

## 2015-03-08 ENCOUNTER — Ambulatory Visit: Payer: Self-pay | Admitting: Physician Assistant

## 2015-03-08 NOTE — H&P (Signed)
Adam Tyler is an 59 y.o. male.   Chief Complaint: left knee pain HPI: We have been following pt for left knee pain for the last couple months.  MRI of the knee shows meniscal tearing with osteoarthritis that is probably a little more advanced than we were led to believe off the x-ray.  He has a defined tear, positive McMurray's, significant pain, antalgic  gait and lots of pain.  Past Medical History  Diagnosis Date  . GERD (gastroesophageal reflux disease)   . Arthritis   . Hypertension   . Upper respiratory infection 05/17/13    no fever since 05/18/13  . Cancer 2007    melanoma forehead   . Anxiety     h/o panic attacks from " stress"  . Sciatica of left side     per pt  "buldging disc- lumbar"  . Sleep apnea     uses CPAP  . Acute medial meniscal injury of knee     left    Past Surgical History  Procedure Laterality Date  . Hernia repair    . Tonsillectomy    . Hand surgery    . Skin graft Right   . Mass excision Right 06/19/2013    Procedure: EXCISION MASS RIGHT POSTERIOR NECK;  Surgeon: Earnstine Regal, MD;  Location: [facility];  Service: General;  Laterality: Right;    Family History  Problem Relation Age of Onset  . Cancer Mother     lymphnode  . Stroke Father    Social History:  reports that he has been smoking.  He has never used smokeless tobacco. He reports that he drinks alcohol. He reports that he does not use illicit drugs.  Allergies: No Known Allergies   (Not in a hospital admission)  Results for orders placed or performed during the hospital encounter of 03/09/15 (from the past 48 hour(s))  Basic metabolic panel     Status: Abnormal   Collection Time: 03/07/15 10:50 AM  Result Value Ref Range   Sodium 139 135 - 145 mmol/L   Potassium 3.9 3.5 - 5.1 mmol/L   Chloride 106 101 - 111 mmol/L   CO2 25 22 - 32 mmol/L   Glucose, Bld 164 (H) 65 - 99 mg/dL   BUN 11 6 - 20 mg/dL   Creatinine, Ser 0.77 0.61 - 1.24 mg/dL   Calcium 9.5 8.9 - 10.3 mg/dL   GFR  calc non Af Amer >60 >60 mL/min   GFR calc Af Amer >60 >60 mL/min    Comment: (NOTE) The eGFR has been calculated using the CKD EPI equation. This calculation has not been validated in all clinical situations. eGFR's persistently <60 mL/min signify possible Chronic Kidney Disease.    Anion gap 8 5 - 15   No results found.  Review of Systems  HENT: Positive for hearing loss.   Musculoskeletal: Positive for joint pain.  Psychiatric/Behavioral: Positive for depression.  All other systems reviewed and are negative.   There were no vitals taken for this visit. Physical Exam  Constitutional: He is oriented to person, place, and time. He appears well-developed and well-nourished. No distress.  HENT:  Head: Normocephalic and atraumatic.  Nose: Nose normal.  Eyes: EOM are normal. Pupils are equal, round, and reactive to light.  Neck: Normal range of motion. Neck supple.  Cardiovascular: Normal rate and intact distal pulses.   Respiratory: Effort normal. No respiratory distress.  GI: Soft. He exhibits no distension. There is no tenderness.  Musculoskeletal:  Left knee: He exhibits decreased range of motion and swelling. He exhibits no erythema, no LCL laxity and no MCL laxity. Tenderness found. Medial joint line tenderness noted.  Neurological: He is alert and oriented to person, place, and time. No cranial nerve deficit.  Skin: Skin is warm and dry. No erythema.  Psychiatric: He has a normal mood and affect. His behavior is normal.     Assessment/Plan Left knee medial meniscus tear and osteoarthritis  At this point, I recommend surgery.  Mechanical symptoms are present.  He has had a good deal of time to see if this would dissipate on its own.  It has not.  We discussed the MRI findings, the pre, peri and post-op issues related to his knee.  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  He may be a candidate for microfracture.  Time out of work anywhere from  4-6 weeks up to 3 months.  He wants to keep working.  I do think there is an issue long term related to his osteoarthritis where we are concerned over time that he may end up needing partial vs. complete knee replacement but we are trying to avoid that and that would be too aggressive of a step to take at this point.    Recommend arthroscopy, meniscectomy, debridement, possible microfracture.  #60 Percocet given. He will try to work up to the point of having the surgery.  Risks and benefits discussed in detail.  He is a smoker.  Suggested to go on one 325 mg. Aspirin b.i.d. after surgery.    Chriss Czar 03/08/2015, 12:57 PM

## 2015-03-09 ENCOUNTER — Encounter (HOSPITAL_BASED_OUTPATIENT_CLINIC_OR_DEPARTMENT_OTHER)
Admission: RE | Disposition: A | Discharge status: Home / Self Care | Payer: Self-pay | Source: Ambulatory Visit | Attending: Orthopedic Surgery

## 2015-03-09 ENCOUNTER — Encounter (HOSPITAL_BASED_OUTPATIENT_CLINIC_OR_DEPARTMENT_OTHER): Payer: Self-pay | Admitting: *Deleted

## 2015-03-09 ENCOUNTER — Ambulatory Visit (HOSPITAL_BASED_OUTPATIENT_CLINIC_OR_DEPARTMENT_OTHER): Payer: Commercial Managed Care - HMO | Admitting: Anesthesiology

## 2015-03-09 ENCOUNTER — Ambulatory Visit (HOSPITAL_BASED_OUTPATIENT_CLINIC_OR_DEPARTMENT_OTHER)
Admission: RE | Admit: 2015-03-09 | Discharge: 2015-03-09 | Disposition: A | Discharge status: Home / Self Care | Payer: Commercial Managed Care - HMO | Source: Ambulatory Visit | Attending: Orthopedic Surgery | Admitting: Orthopedic Surgery

## 2015-03-09 DIAGNOSIS — I10 Essential (primary) hypertension: Secondary | ICD-10-CM | POA: Insufficient documentation

## 2015-03-09 DIAGNOSIS — S83242A Other tear of medial meniscus, current injury, left knee, initial encounter: Secondary | ICD-10-CM | POA: Insufficient documentation

## 2015-03-09 DIAGNOSIS — X58XXXA Exposure to other specified factors, initial encounter: Secondary | ICD-10-CM | POA: Insufficient documentation

## 2015-03-09 DIAGNOSIS — M94262 Chondromalacia, left knee: Secondary | ICD-10-CM | POA: Insufficient documentation

## 2015-03-09 DIAGNOSIS — M1712 Unilateral primary osteoarthritis, left knee: Secondary | ICD-10-CM | POA: Insufficient documentation

## 2015-03-09 HISTORY — PX: CHONDROPLASTY: SHX5177

## 2015-03-09 HISTORY — DX: Sleep apnea, unspecified: G47.30

## 2015-03-09 HISTORY — DX: Sprain of other specified parts of unspecified knee, initial encounter: S83.8X9A

## 2015-03-09 HISTORY — PX: KNEE ARTHROSCOPY WITH MEDIAL MENISECTOMY: SHX5651

## 2015-03-09 SURGERY — ARTHROSCOPY, KNEE, WITH MEDIAL MENISCECTOMY
Anesthesia: General | Site: Knee | Laterality: Left

## 2015-03-09 MED ORDER — PROPOFOL 10 MG/ML IV BOLUS
INTRAVENOUS | Status: AC
  Filled 2015-03-09: qty 20

## 2015-03-09 MED ORDER — DEXAMETHASONE SODIUM PHOSPHATE 4 MG/ML IJ SOLN
INTRAMUSCULAR | Status: DC | PRN
  Administered 2015-03-09: 10 mg via INTRAVENOUS

## 2015-03-09 MED ORDER — PROPOFOL 10 MG/ML IV BOLUS
INTRAVENOUS | Status: DC | PRN
  Administered 2015-03-09: 200 mg via INTRAVENOUS

## 2015-03-09 MED ORDER — OXYCODONE HCL 5 MG PO TABS: 5.0000 mg | ORAL_TABLET | Freq: Once | ORAL | Status: DC | PRN

## 2015-03-09 MED ORDER — HYDROMORPHONE HCL 1 MG/ML IJ SOLN
0.2500 mg | INTRAMUSCULAR | Status: DC | PRN
  Administered 2015-03-09: 0.5 mg via INTRAVENOUS

## 2015-03-09 MED ORDER — CEFAZOLIN SODIUM-DEXTROSE 2-3 GM-% IV SOLR
INTRAVENOUS | Status: AC
  Filled 2015-03-09: qty 50

## 2015-03-09 MED ORDER — KETOROLAC TROMETHAMINE 30 MG/ML IJ SOLN
INTRAMUSCULAR | Status: DC | PRN
  Administered 2015-03-09: 30 mg via INTRAVENOUS

## 2015-03-09 MED ORDER — FENTANYL CITRATE (PF) 100 MCG/2ML IJ SOLN
INTRAMUSCULAR | Status: AC
  Filled 2015-03-09: qty 4

## 2015-03-09 MED ORDER — LACTATED RINGERS IV SOLN
INTRAVENOUS | Status: DC
  Administered 2015-03-09: 10:00:00 via INTRAVENOUS

## 2015-03-09 MED ORDER — ASPIRIN EC 325 MG PO TBEC
325.0000 mg | DELAYED_RELEASE_TABLET | Freq: Two times a day (BID) | ORAL | Status: DC
Start: 2015-03-09 — End: 2015-04-25

## 2015-03-09 MED ORDER — HYDROMORPHONE HCL 1 MG/ML IJ SOLN
INTRAMUSCULAR | Status: AC
  Filled 2015-03-09: qty 1

## 2015-03-09 MED ORDER — MEPERIDINE HCL 25 MG/ML IJ SOLN: 6.2500 mg | INTRAMUSCULAR | Status: DC | PRN

## 2015-03-09 MED ORDER — METHYLPREDNISOLONE ACETATE 80 MG/ML IJ SUSP
INTRAMUSCULAR | Status: AC
  Filled 2015-03-09: qty 1

## 2015-03-09 MED ORDER — MIDAZOLAM HCL 2 MG/2ML IJ SOLN
INTRAMUSCULAR | Status: AC
  Filled 2015-03-09: qty 4

## 2015-03-09 MED ORDER — SCOPOLAMINE 1 MG/3DAYS TD PT72
1.0000 | MEDICATED_PATCH | Freq: Once | TRANSDERMAL | Status: DC | PRN
  Administered 2015-03-09: 1.5 mg via TRANSDERMAL

## 2015-03-09 MED ORDER — MIDAZOLAM HCL 2 MG/2ML IJ SOLN
1.0000 mg | INTRAMUSCULAR | Status: DC | PRN
  Administered 2015-03-09: 2 mg via INTRAVENOUS

## 2015-03-09 MED ORDER — FENTANYL CITRATE (PF) 100 MCG/2ML IJ SOLN
50.0000 ug | INTRAMUSCULAR | Status: AC | PRN
  Administered 2015-03-09: 50 ug via INTRAVENOUS
  Administered 2015-03-09: 100 ug via INTRAVENOUS
  Administered 2015-03-09: 50 ug via INTRAVENOUS

## 2015-03-09 MED ORDER — GLYCOPYRROLATE 0.2 MG/ML IJ SOLN: 0.2000 mg | Freq: Once | INTRAMUSCULAR | Status: DC | PRN

## 2015-03-09 MED ORDER — DEXAMETHASONE SODIUM PHOSPHATE 10 MG/ML IJ SOLN
INTRAMUSCULAR | Status: AC
  Filled 2015-03-09: qty 1

## 2015-03-09 MED ORDER — ONDANSETRON HCL 4 MG/2ML IJ SOLN
INTRAMUSCULAR | Status: DC | PRN
  Administered 2015-03-09: 4 mg via INTRAVENOUS

## 2015-03-09 MED ORDER — HYDROCODONE-ACETAMINOPHEN 7.5-325 MG PO TABS: 1.0000 | ORAL_TABLET | ORAL | Status: DC | PRN

## 2015-03-09 MED ORDER — BUPIVACAINE-EPINEPHRINE 0.5% -1:200000 IJ SOLN
INTRAMUSCULAR | Status: DC | PRN
  Administered 2015-03-09: 20 mL

## 2015-03-09 MED ORDER — METHYLPREDNISOLONE ACETATE 80 MG/ML IJ SUSP
INTRAMUSCULAR | Status: DC | PRN
  Administered 2015-03-09: 80 mg

## 2015-03-09 MED ORDER — ONDANSETRON HCL 4 MG/2ML IJ SOLN
INTRAMUSCULAR | Status: AC
  Filled 2015-03-09: qty 2

## 2015-03-09 MED ORDER — CEFAZOLIN SODIUM-DEXTROSE 2-3 GM-% IV SOLR
2.0000 g | INTRAVENOUS | Status: AC
  Administered 2015-03-09: 2 g via INTRAVENOUS

## 2015-03-09 MED ORDER — SCOPOLAMINE 1 MG/3DAYS TD PT72
MEDICATED_PATCH | TRANSDERMAL | Status: AC
  Filled 2015-03-09: qty 1

## 2015-03-09 MED ORDER — CHLORHEXIDINE GLUCONATE 4 % EX LIQD: 60.0000 mL | Freq: Once | CUTANEOUS | Status: DC

## 2015-03-09 MED ORDER — OXYCODONE HCL 5 MG/5ML PO SOLN: 5.0000 mg | Freq: Once | ORAL | Status: DC | PRN

## 2015-03-09 MED ORDER — SODIUM CHLORIDE 0.9 % IV SOLN: INTRAVENOUS | Status: DC

## 2015-03-09 MED ORDER — SODIUM CHLORIDE 0.9 % IR SOLN
Status: DC | PRN
  Administered 2015-03-09: 6000 mL

## 2015-03-09 MED ORDER — LIDOCAINE HCL (CARDIAC) 20 MG/ML IV SOLN
INTRAVENOUS | Status: DC | PRN
  Administered 2015-03-09: 80 mg via INTRAVENOUS

## 2015-03-09 SURGICAL SUPPLY — 39 items
BANDAGE ELASTIC 6 VELCRO ST LF (GAUZE/BANDAGES/DRESSINGS) ×1 IMPLANT
BANDAGE ESMARK 6X9 LF (GAUZE/BANDAGES/DRESSINGS) IMPLANT
BLADE 4.2CUDA (BLADE) ×1 IMPLANT
BLADE CUDA GRT WHITE 3.5 (BLADE) ×1 IMPLANT
BNDG CMPR 9X6 STRL LF SNTH (GAUZE/BANDAGES/DRESSINGS)
BNDG ESMARK 6X9 LF (GAUZE/BANDAGES/DRESSINGS)
BNDG GAUZE ELAST 4 BULKY (GAUZE/BANDAGES/DRESSINGS) ×2 IMPLANT
BRUSH SCRUB EZ PLAIN DRY (MISCELLANEOUS) ×2 IMPLANT
DRAPE ARTHROSCOPY W/POUCH 90 (DRAPES) ×2 IMPLANT
DRSG EMULSION OIL 3X3 NADH (GAUZE/BANDAGES/DRESSINGS) ×2 IMPLANT
DURAPREP 26ML APPLICATOR (WOUND CARE) ×2 IMPLANT
GAUZE SPONGE 4X4 12PLY STRL (GAUZE/BANDAGES/DRESSINGS) ×2 IMPLANT
GLOVE BIO SURGEON STRL SZ7.5 (GLOVE) ×2 IMPLANT
GLOVE BIOGEL M STRL SZ7.5 (GLOVE) ×1 IMPLANT
GLOVE BIOGEL PI IND STRL 7.0 (GLOVE) IMPLANT
GLOVE BIOGEL PI IND STRL 8 (GLOVE) ×2 IMPLANT
GLOVE BIOGEL PI INDICATOR 7.0 (GLOVE) ×1
GLOVE BIOGEL PI INDICATOR 8 (GLOVE) ×3
GLOVE SURG ORTHO 8.0 STRL STRW (GLOVE) ×2 IMPLANT
GOWN STRL REUS W/ TWL LRG LVL3 (GOWN DISPOSABLE) ×1 IMPLANT
GOWN STRL REUS W/ TWL XL LVL3 (GOWN DISPOSABLE) ×1 IMPLANT
GOWN STRL REUS W/TWL LRG LVL3 (GOWN DISPOSABLE)
GOWN STRL REUS W/TWL XL LVL3 (GOWN DISPOSABLE) ×4
HOLDER KNEE FOAM BLUE (MISCELLANEOUS) ×2 IMPLANT
KNEE WRAP E Z 3 GEL PACK (MISCELLANEOUS) ×2 IMPLANT
MANIFOLD NEPTUNE II (INSTRUMENTS) ×1 IMPLANT
NDL SAFETY ECLIPSE 18X1.5 (NEEDLE) ×1 IMPLANT
NEEDLE HYPO 18GX1.5 SHARP (NEEDLE) ×2
PACK ARTHROSCOPY DSU (CUSTOM PROCEDURE TRAY) ×2 IMPLANT
PACK BASIN DAY SURGERY FS (CUSTOM PROCEDURE TRAY) ×2 IMPLANT
SET ARTHROSCOPY TUBING (MISCELLANEOUS) ×2
SET ARTHROSCOPY TUBING LN (MISCELLANEOUS) ×1 IMPLANT
SUT ETHILON 4 0 PS 2 18 (SUTURE) ×2 IMPLANT
SYR 5ML LL (SYRINGE) ×2 IMPLANT
TOWEL OR 17X24 6PK STRL BLUE (TOWEL DISPOSABLE) ×2 IMPLANT
WAND 3.0 CAPSURE 30 DEG W/CORD (SURGICAL WAND) IMPLANT
WAND 30 DEG SABER W/CORD (SURGICAL WAND) IMPLANT
WAND STAR VAC 90 (SURGICAL WAND) IMPLANT
WATER STERILE IRR 1000ML POUR (IV SOLUTION) ×2 IMPLANT

## 2015-03-09 NOTE — Discharge Instructions (Signed)
Diet: As you were doing prior to hospitalization   Activity: Increase activity slowly as tolerated  No lifting or driving for 50IBB  Shower: May shower without a dressing on post op day #2, NO SOAKING in tub   Dressing: You may change your dressing on post op day #2.  Then change the dressing daily with sterile 4"x4"s gauze dressing  Or band aids.  Weight Bearing: weight bearing as tolerated  To prevent constipation: you may use a stool softener such as -  Colace ( over the counter) 100 mg by mouth twice a day  Drink plenty of fluids ( prune juice may be helpful) and high fiber foods  Miralax ( over the counter) for constipation as needed.   Precautions: If you experience chest pain or shortness of breath - call 911 immediately For transfer to the hospital emergency department!!  If you develop a fever greater that 101 F, purulent drainage from wound, increased redness or drainage from wound, or calf pain -- Call the office   Follow- Up Appointment: Please call for an appointment to be seen in 1 week or as previously set up  [facility] - [phone]     Post Anesthesia Home Care Instructions  Activity: Get plenty of rest for the remainder of the day. A responsible adult should stay with you for 24 hours following the procedure.  For the next 24 hours, DO NOT: -Drive a car -Paediatric nurse -Drink alcoholic beverages -Take any medication unless instructed by your physician -Make any legal decisions or sign important papers.  Meals: Start with liquid foods such as gelatin or soup. Progress to regular foods as tolerated. Avoid greasy, spicy, heavy foods. If nausea and/or vomiting occur, drink only clear liquids until the nausea and/or vomiting subsides. Call your physician if vomiting continues.  Special Instructions/Symptoms: Your throat may feel dry or sore from the anesthesia or the breathing tube placed in your throat during surgery. If this causes discomfort, gargle  with warm salt water. The discomfort should disappear within 24 hours.  If you had a scopolamine patch placed behind your ear for the management of post- operative nausea and/or vomiting:  1. The medication in the patch is effective for 72 hours, after which it should be removed.  Wrap patch in a tissue and discard in the trash. Wash hands thoroughly with soap and water. 2. You may remove the patch earlier than 72 hours if you experience unpleasant side effects which may include dry mouth, dizziness or visual disturbances. 3. Avoid touching the patch. Wash your hands with soap and water after contact with the patch.

## 2015-03-09 NOTE — Anesthesia Postprocedure Evaluation (Signed)
  Anesthesia Post-op Note  Patient: Adam Tyler  Procedure(s) Performed: Procedure(s): KNEE ARTHROSCOPY WITH  PARTIAL MEDIAL MENISECTOMY (Left) CHONDROPLASTY (Left)  Patient Location: PACU  Anesthesia Type: General   Level of Consciousness: awake, alert  and oriented  Airway and Oxygen Therapy: Patient Spontanous Breathing  Post-op Pain: mild  Post-op Assessment: Post-op Vital signs reviewed  Post-op Vital Signs: Reviewed  Last Vitals:  Filed Vitals:   03/09/15 1300  BP: 140/84  Pulse: 67  Temp:   Resp: 15    Complications: No apparent anesthesia complications

## 2015-03-09 NOTE — Anesthesia Preprocedure Evaluation (Signed)
Anesthesia Evaluation  Patient identified by MRN, date of birth, ID band Patient awake    Reviewed: Allergy & Precautions, NPO status , Patient's Chart, lab work & pertinent test results  Airway Mallampati: I  TM Distance: >3 FB Neck ROM: Full    Dental  (+) Teeth Intact, Dental Advisory Given   Pulmonary Current Smoker,  breath sounds clear to auscultation        Cardiovascular hypertension, Pt. on medications Rhythm:Regular Rate:Normal     Neuro/Psych    GI/Hepatic GERD-  Medicated and Controlled,  Endo/Other    Renal/GU      Musculoskeletal   Abdominal   Peds  Hematology   Anesthesia Other Findings   Reproductive/Obstetrics                             Anesthesia Physical Anesthesia Plan  ASA: II  Anesthesia Plan: General   Post-op Pain Management:    Induction: Intravenous  Airway Management Planned: LMA  Additional Equipment:   Intra-op Plan:   Post-operative Plan: Extubation in OR  Informed Consent: I have reviewed the patients History and Physical, chart, labs and discussed the procedure including the risks, benefits and alternatives for the proposed anesthesia with the patient or authorized representative who has indicated his/her understanding and acceptance.   Dental advisory given  Plan Discussed with: CRNA, Anesthesiologist and Surgeon  Anesthesia Plan Comments:         Anesthesia Quick Evaluation  

## 2015-03-09 NOTE — Transfer of Care (Signed)
Immediate Anesthesia Transfer of Care Note  Patient: Adam Tyler  Procedure(s) Performed: Procedure(s): KNEE ARTHROSCOPY WITH  PARTIAL MEDIAL MENISECTOMY (Left) CHONDROPLASTY (Left)  Patient Location: PACU  Anesthesia Type:General  Level of Consciousness: sedated  Airway & Oxygen Therapy: Patient Spontanous Breathing and Patient connected to face mask oxygen  Post-op Assessment: Report given to RN and Post -op Vital signs reviewed and stable  Post vital signs: Reviewed and stable  Last Vitals: There were no vitals filed for this visit.  Complications: No apparent anesthesia complications

## 2015-03-09 NOTE — Brief Op Note (Signed)
03/09/2015  12:41 PM  PATIENT:  Deitra Mayo  59 y.o. male  PRE-OPERATIVE DIAGNOSIS:  LEFT KNEE MEDIAL MENISCAL TEAR   POST-OPERATIVE DIAGNOSIS:  LEFT KNEE MEDIAL MENISCUS TEAR CHONDROMALACIA  PROCEDURE:  Procedure(s): KNEE ARTHROSCOPY WITH  PARTIAL MEDIAL MENISECTOMY (Left) CHONDROPLASTY (Left)  SURGEON:  Surgeon(s) and Role:    * Earlie Server, MD - Primary  PHYSICIAN ASSISTANT:   ASSISTANTS:  ANESTHESIA:   general  EBL:  Total I/O In: 800 [I.V.:800] Out: -   BLOOD ADMINISTERED:none  DRAINS: none   LOCAL MEDICATIONS USED:  NONE  SPECIMEN:  No Specimen  DISPOSITION OF SPECIMEN:  N/A  COUNTS:  YES  TOURNIQUET:  * No tourniquets in log *  DICTATION: .Other Dictation: Dictation Number unknown  PLAN OF CARE: Discharge to home after PACU  PATIENT DISPOSITION:  PACU - hemodynamically stable.   Delay start of Pharmacological VTE agent (>24hrs) due to surgical blood loss or risk of bleeding: yes

## 2015-03-09 NOTE — Anesthesia Procedure Notes (Signed)
Procedure Name: LMA Insertion Date/Time: 03/09/2015 11:46 AM Performed by: Maryella Shivers Pre-anesthesia Checklist: Patient identified, Emergency Drugs available, Suction available and Patient being monitored Patient Re-evaluated:Patient Re-evaluated prior to inductionOxygen Delivery Method: Circle System Utilized Preoxygenation: Pre-oxygenation with 100% oxygen Intubation Type: IV induction Ventilation: Mask ventilation without difficulty LMA: LMA inserted LMA Size: 5.0 Number of attempts: 1 Airway Equipment and Method: Bite block Placement Confirmation: positive ETCO2 Tube secured with: Tape Dental Injury: Teeth and Oropharynx as per pre-operative assessment

## 2015-03-10 ENCOUNTER — Encounter (HOSPITAL_BASED_OUTPATIENT_CLINIC_OR_DEPARTMENT_OTHER): Payer: Self-pay | Admitting: Orthopedic Surgery

## 2015-03-10 NOTE — Op Note (Signed)
NAMESTRAN, RAPER                  ACCOUNT NO.:  [id number]  MEDICAL RECORD NO.:  [id number]  LOCATION:                               FACILITY:  [facility]  PHYSICIAN:  Lockie Pares, M.D.    DATE OF BIRTH:  Apr 19, 1956  DATE OF PROCEDURE:  03/09/2015 DATE OF DISCHARGE:  03/09/2015                              OPERATIVE REPORT   PREOPERATIVE DIAGNOSES: 1. Left knee complex tear, posterior horn meniscus. 2. Grade 3 chondromalacia, patellofemoral joint, medial compartment.  POSTOPERATIVE DIAGNOSES: 1. Left knee complex tear, posterior horn meniscus. 2. Grade 3 chondromalacia, patellofemoral joint, medial compartment.  OPERATION: 1. Partial medial meniscectomy (posterior horn). 2. Debridement and chondroplasty, patellofemoral joint, medial     compartment.  SURGEON:  Lockie Pares, M.D.  ANESTHESIA:  General anesthetic with local supplementation.  DESCRIPTION OF PROCEDURE:  Inferomedial and inferolateral portals were made. Lateral compartment showed some fraying of the meniscus and mild softening of the articular cartilage essentially normal.  Early grade 3 and advanced grade 3 changes of the patellofemoral joint, medial compartment respectively.  Most of the changes on the medial side were based on the femur, these were extensive grade 3 changes, no grade 4 changes.  Complex care of the meniscus was noted, resection of most of the posterior horn back to a stable edge.  It was estimated we had to take about 50% of the meniscus.  The chondral surface was debrided. There was no grade 4, did not have to do a microfracture and chondroplasty carried out patellofemoral joint medial compartment. Partial medial meniscectomy.  Knee drained free of fluid.  Portals closed with nylon.  Infiltrated with 20 mL of 0.5% Marcaine with 80 mg Depo-Medrol intra-articularly with additional 10 mL into the subcutaneous tissues.  Lightly compressive sterile dressing applied. Taken to the recovery room in a  stable condition.     Lockie Pares, M.D.     WDC/MEDQ  D:  03/09/2015  T:  03/09/2015  Job:  [id number]

## 2015-03-18 ENCOUNTER — Ambulatory Visit (HOSPITAL_COMMUNITY)
Admission: AD | Admit: 2015-03-18 | Payer: Commercial Managed Care - HMO | Source: Ambulatory Visit | Admitting: Orthopedic Surgery

## 2015-03-18 ENCOUNTER — Inpatient Hospital Stay (HOSPITAL_COMMUNITY): Payer: Commercial Managed Care - HMO | Admitting: Certified Registered Nurse Anesthetist

## 2015-03-18 ENCOUNTER — Inpatient Hospital Stay (HOSPITAL_COMMUNITY)
Admission: AD | Admit: 2015-03-18 | Discharge: 2015-03-21 | DRG: 857 | Disposition: A | Discharge status: Home Health | Payer: Commercial Managed Care - HMO | Source: Ambulatory Visit | Attending: Orthopedic Surgery | Admitting: Orthopedic Surgery

## 2015-03-18 ENCOUNTER — Encounter (HOSPITAL_COMMUNITY): Payer: Self-pay | Admitting: General Practice

## 2015-03-18 ENCOUNTER — Encounter (HOSPITAL_COMMUNITY)
Admission: AD | Disposition: A | Discharge status: Home Health | Payer: Self-pay | Source: Ambulatory Visit | Attending: Orthopedic Surgery

## 2015-03-18 DIAGNOSIS — R7989 Other specified abnormal findings of blood chemistry: Secondary | ICD-10-CM | POA: Diagnosis present

## 2015-03-18 DIAGNOSIS — G4730 Sleep apnea, unspecified: Secondary | ICD-10-CM | POA: Diagnosis present

## 2015-03-18 DIAGNOSIS — F17210 Nicotine dependence, cigarettes, uncomplicated: Secondary | ICD-10-CM | POA: Diagnosis present

## 2015-03-18 DIAGNOSIS — I10 Essential (primary) hypertension: Secondary | ICD-10-CM | POA: Diagnosis present

## 2015-03-18 DIAGNOSIS — Z789 Other specified health status: Secondary | ICD-10-CM | POA: Insufficient documentation

## 2015-03-18 DIAGNOSIS — M00062 Staphylococcal arthritis, left knee: Secondary | ICD-10-CM | POA: Diagnosis present

## 2015-03-18 DIAGNOSIS — R945 Abnormal results of liver function studies: Secondary | ICD-10-CM

## 2015-03-18 DIAGNOSIS — F1090 Alcohol use, unspecified, uncomplicated: Secondary | ICD-10-CM | POA: Insufficient documentation

## 2015-03-18 DIAGNOSIS — Z85820 Personal history of malignant melanoma of skin: Secondary | ICD-10-CM | POA: Diagnosis not present

## 2015-03-18 DIAGNOSIS — A4901 Methicillin susceptible Staphylococcus aureus infection, unspecified site: Secondary | ICD-10-CM | POA: Insufficient documentation

## 2015-03-18 DIAGNOSIS — Z113 Encounter for screening for infections with a predominantly sexual mode of transmission: Secondary | ICD-10-CM | POA: Insufficient documentation

## 2015-03-18 DIAGNOSIS — Z114 Encounter for screening for human immunodeficiency virus [HIV]: Secondary | ICD-10-CM | POA: Diagnosis not present

## 2015-03-18 DIAGNOSIS — F17200 Nicotine dependence, unspecified, uncomplicated: Secondary | ICD-10-CM | POA: Insufficient documentation

## 2015-03-18 DIAGNOSIS — Z7289 Other problems related to lifestyle: Secondary | ICD-10-CM

## 2015-03-18 DIAGNOSIS — T814XXA Infection following a procedure, initial encounter: Secondary | ICD-10-CM | POA: Diagnosis present

## 2015-03-18 DIAGNOSIS — B9561 Methicillin susceptible Staphylococcus aureus infection as the cause of diseases classified elsewhere: Secondary | ICD-10-CM | POA: Diagnosis not present

## 2015-03-18 DIAGNOSIS — Y838 Other surgical procedures as the cause of abnormal reaction of the patient, or of later complication, without mention of misadventure at the time of the procedure: Secondary | ICD-10-CM | POA: Diagnosis present

## 2015-03-18 DIAGNOSIS — T8149XA Infection following a procedure, other surgical site, initial encounter: Secondary | ICD-10-CM | POA: Diagnosis present

## 2015-03-18 HISTORY — PX: KNEE ARTHROSCOPY: SHX127

## 2015-03-18 HISTORY — DX: Pyogenic arthritis, unspecified: M00.9

## 2015-03-18 LAB — SYNOVIAL CELL COUNT + DIFF, W/ CRYSTALS
Crystals, Fluid: NONE SEEN
Monocyte-Macrophage-Synovial Fluid: 4 % — ABNORMAL LOW (ref 50–90)
NEUTROPHIL, SYNOVIAL: 96 % — AB (ref 0–25)
WBC, Synovial: UNDETERMINED /mm3 (ref 0–200)

## 2015-03-18 SURGERY — ARTHROSCOPY, KNEE
Anesthesia: General | Site: Knee | Laterality: Left

## 2015-03-18 MED ORDER — POTASSIUM CHLORIDE IN NACL 20-0.45 MEQ/L-% IV SOLN
INTRAVENOUS | Status: DC
  Administered 2015-03-18: 100 mL/h via INTRAVENOUS
  Filled 2015-03-18 (×2): qty 1000

## 2015-03-18 MED ORDER — ACETAMINOPHEN 650 MG RE SUPP: 650.0000 mg | Freq: Four times a day (QID) | RECTAL | Status: DC | PRN

## 2015-03-18 MED ORDER — PROMETHAZINE HCL 25 MG/ML IJ SOLN
INTRAMUSCULAR | Status: AC
  Filled 2015-03-18: qty 1

## 2015-03-18 MED ORDER — ACETAMINOPHEN 325 MG PO TABS
650.0000 mg | ORAL_TABLET | Freq: Four times a day (QID) | ORAL | Status: DC | PRN
  Administered 2015-03-19: 650 mg via ORAL
  Filled 2015-03-18: qty 2

## 2015-03-18 MED ORDER — BUPIVACAINE-EPINEPHRINE (PF) 0.5% -1:200000 IJ SOLN
INTRAMUSCULAR | Status: AC
  Filled 2015-03-18: qty 30

## 2015-03-18 MED ORDER — KETOROLAC TROMETHAMINE 15 MG/ML IJ SOLN
15.0000 mg | Freq: Four times a day (QID) | INTRAMUSCULAR | Status: AC
  Administered 2015-03-19 (×4): 15 mg via INTRAVENOUS
  Filled 2015-03-18 (×4): qty 1

## 2015-03-18 MED ORDER — METOCLOPRAMIDE HCL 5 MG PO TABS: 5.0000 mg | ORAL_TABLET | Freq: Three times a day (TID) | ORAL | Status: DC | PRN

## 2015-03-18 MED ORDER — ONDANSETRON HCL 4 MG PO TABS: 4.0000 mg | ORAL_TABLET | Freq: Four times a day (QID) | ORAL | Status: DC | PRN

## 2015-03-18 MED ORDER — METHOCARBAMOL 500 MG PO TABS
500.0000 mg | ORAL_TABLET | Freq: Four times a day (QID) | ORAL | Status: DC | PRN
  Administered 2015-03-20: 500 mg via ORAL
  Filled 2015-03-18: qty 1

## 2015-03-18 MED ORDER — PROPOFOL 10 MG/ML IV BOLUS
INTRAVENOUS | Status: AC
  Filled 2015-03-18: qty 20

## 2015-03-18 MED ORDER — SODIUM CHLORIDE 0.9 % IR SOLN
Status: DC | PRN
  Administered 2015-03-18 (×4): 3000 mL

## 2015-03-18 MED ORDER — KETOROLAC TROMETHAMINE 30 MG/ML IJ SOLN
INTRAMUSCULAR | Status: DC | PRN
  Administered 2015-03-18: 30 mg via INTRAVENOUS

## 2015-03-18 MED ORDER — ACETAMINOPHEN 500 MG PO TABS
1000.0000 mg | ORAL_TABLET | Freq: Once | ORAL | Status: AC
  Administered 2015-03-18: 1000 mg via ORAL
  Filled 2015-03-18: qty 2

## 2015-03-18 MED ORDER — VENLAFAXINE HCL ER 75 MG PO CP24
75.0000 mg | ORAL_CAPSULE | Freq: Every day | ORAL | Status: DC
  Administered 2015-03-19: 75 mg via ORAL
  Filled 2015-03-18 (×2): qty 1

## 2015-03-18 MED ORDER — FENTANYL CITRATE (PF) 250 MCG/5ML IJ SOLN
INTRAMUSCULAR | Status: AC
  Filled 2015-03-18: qty 5

## 2015-03-18 MED ORDER — VANCOMYCIN HCL IN DEXTROSE 1-5 GM/200ML-% IV SOLN
1000.0000 mg | Freq: Three times a day (TID) | INTRAVENOUS | Status: DC
  Administered 2015-03-19 – 2015-03-20 (×4): 1000 mg via INTRAVENOUS
  Filled 2015-03-18 (×6): qty 200

## 2015-03-18 MED ORDER — CEFAZOLIN SODIUM-DEXTROSE 2-3 GM-% IV SOLR: 2.0000 g | INTRAVENOUS | Status: DC

## 2015-03-18 MED ORDER — CHLORHEXIDINE GLUCONATE 4 % EX LIQD
60.0000 mL | Freq: Once | CUTANEOUS | Status: DC
Start: 2015-03-18 — End: 2015-03-18
  Filled 2015-03-18: qty 60

## 2015-03-18 MED ORDER — OXYCODONE HCL 5 MG PO TABS
5.0000 mg | ORAL_TABLET | ORAL | Status: DC | PRN
  Administered 2015-03-18 – 2015-03-21 (×4): 10 mg via ORAL
  Filled 2015-03-18 (×5): qty 2

## 2015-03-18 MED ORDER — LACTATED RINGERS IV SOLN
INTRAVENOUS | Status: DC | PRN
  Administered 2015-03-18: 20:00:00 via INTRAVENOUS

## 2015-03-18 MED ORDER — ONDANSETRON HCL 4 MG/2ML IJ SOLN: 4.0000 mg | Freq: Four times a day (QID) | INTRAMUSCULAR | Status: DC | PRN

## 2015-03-18 MED ORDER — BISACODYL 10 MG RE SUPP: 10.0000 mg | Freq: Every day | RECTAL | Status: DC | PRN

## 2015-03-18 MED ORDER — ASPIRIN EC 325 MG PO TBEC
325.0000 mg | DELAYED_RELEASE_TABLET | Freq: Two times a day (BID) | ORAL | Status: DC
  Administered 2015-03-19 – 2015-03-21 (×5): 325 mg via ORAL
  Filled 2015-03-18 (×4): qty 1

## 2015-03-18 MED ORDER — METOCLOPRAMIDE HCL 5 MG/ML IJ SOLN: 5.0000 mg | Freq: Three times a day (TID) | INTRAMUSCULAR | Status: DC | PRN

## 2015-03-18 MED ORDER — FENTANYL CITRATE (PF) 100 MCG/2ML IJ SOLN
INTRAMUSCULAR | Status: DC | PRN
  Administered 2015-03-18: 100 ug via INTRAVENOUS
  Administered 2015-03-18 (×2): 50 ug via INTRAVENOUS
  Administered 2015-03-18 (×2): 100 ug via INTRAVENOUS
  Administered 2015-03-18: 50 ug via INTRAVENOUS

## 2015-03-18 MED ORDER — MIDAZOLAM HCL 2 MG/2ML IJ SOLN
INTRAMUSCULAR | Status: AC
  Filled 2015-03-18: qty 4

## 2015-03-18 MED ORDER — HYDROMORPHONE HCL 1 MG/ML IJ SOLN: 1.0000 mg | INTRAMUSCULAR | Status: DC | PRN

## 2015-03-18 MED ORDER — DEXAMETHASONE SODIUM PHOSPHATE 10 MG/ML IJ SOLN
INTRAMUSCULAR | Status: AC
  Filled 2015-03-18: qty 1

## 2015-03-18 MED ORDER — PROMETHAZINE HCL 25 MG/ML IJ SOLN
6.2500 mg | INTRAMUSCULAR | Status: DC | PRN
  Administered 2015-03-18: 6.25 mg via INTRAVENOUS

## 2015-03-18 MED ORDER — ROCURONIUM BROMIDE 50 MG/5ML IV SOLN
INTRAVENOUS | Status: AC
  Filled 2015-03-18: qty 1

## 2015-03-18 MED ORDER — POLYETHYLENE GLYCOL 3350 17 G PO PACK: 17.0000 g | PACK | Freq: Every day | ORAL | Status: DC | PRN

## 2015-03-18 MED ORDER — NEOSTIGMINE METHYLSULFATE 10 MG/10ML IV SOLN
INTRAVENOUS | Status: AC
  Filled 2015-03-18: qty 1

## 2015-03-18 MED ORDER — ONDANSETRON HCL 4 MG/2ML IJ SOLN
INTRAMUSCULAR | Status: AC
  Filled 2015-03-18: qty 2

## 2015-03-18 MED ORDER — LOSARTAN POTASSIUM 50 MG PO TABS
100.0000 mg | ORAL_TABLET | Freq: Every day | ORAL | Status: DC
  Administered 2015-03-19 – 2015-03-21 (×3): 100 mg via ORAL
  Filled 2015-03-18 (×3): qty 2

## 2015-03-18 MED ORDER — SODIUM CHLORIDE 0.9 % IV SOLN
INTRAVENOUS | Status: DC
  Administered 2015-03-18 – 2015-03-19 (×2): via INTRAVENOUS

## 2015-03-18 MED ORDER — HYDROMORPHONE HCL 1 MG/ML IJ SOLN
INTRAMUSCULAR | Status: AC
  Filled 2015-03-18: qty 1

## 2015-03-18 MED ORDER — HYDROMORPHONE HCL 1 MG/ML IJ SOLN
0.2500 mg | INTRAMUSCULAR | Status: DC | PRN
  Administered 2015-03-18: 0.5 mg via INTRAVENOUS

## 2015-03-18 MED ORDER — LOSARTAN POTASSIUM-HCTZ 100-12.5 MG PO TABS: 1.0000 | ORAL_TABLET | Freq: Every day | ORAL | Status: DC

## 2015-03-18 MED ORDER — VANCOMYCIN HCL IN DEXTROSE 1-5 GM/200ML-% IV SOLN
INTRAVENOUS | Status: AC
  Filled 2015-03-18: qty 200

## 2015-03-18 MED ORDER — KETOROLAC TROMETHAMINE 30 MG/ML IJ SOLN
INTRAMUSCULAR | Status: AC
  Filled 2015-03-18: qty 1

## 2015-03-18 MED ORDER — HYDROCHLOROTHIAZIDE 12.5 MG PO CAPS
12.5000 mg | ORAL_CAPSULE | Freq: Every day | ORAL | Status: DC
  Administered 2015-03-19: 12.5 mg via ORAL
  Filled 2015-03-18: qty 1

## 2015-03-18 MED ORDER — PROPOFOL 10 MG/ML IV BOLUS
INTRAVENOUS | Status: DC | PRN
  Administered 2015-03-18: 200 mg via INTRAVENOUS

## 2015-03-18 MED ORDER — DOCUSATE SODIUM 100 MG PO CAPS
100.0000 mg | ORAL_CAPSULE | Freq: Two times a day (BID) | ORAL | Status: DC
  Administered 2015-03-18 – 2015-03-21 (×6): 100 mg via ORAL
  Filled 2015-03-18 (×6): qty 1

## 2015-03-18 MED ORDER — CEFAZOLIN SODIUM-DEXTROSE 2-3 GM-% IV SOLR
2.0000 g | Freq: Three times a day (TID) | INTRAVENOUS | Status: DC
  Administered 2015-03-18 – 2015-03-21 (×9): 2 g via INTRAVENOUS
  Filled 2015-03-18 (×11): qty 50

## 2015-03-18 MED ORDER — VANCOMYCIN HCL 1000 MG IV SOLR
1000.0000 mg | INTRAVENOUS | Status: DC | PRN
  Administered 2015-03-18: 1000 mg via INTRAVENOUS

## 2015-03-18 MED ORDER — METHOCARBAMOL 1000 MG/10ML IJ SOLN: 500.0000 mg | Freq: Four times a day (QID) | INTRAVENOUS | Status: DC | PRN

## 2015-03-18 MED ORDER — MIDAZOLAM HCL 5 MG/5ML IJ SOLN
INTRAMUSCULAR | Status: DC | PRN
  Administered 2015-03-18: 2 mg via INTRAVENOUS

## 2015-03-18 MED ORDER — DIPHENHYDRAMINE HCL 12.5 MG/5ML PO ELIX: 12.5000 mg | ORAL_SOLUTION | ORAL | Status: DC | PRN

## 2015-03-18 MED ORDER — FLEET ENEMA 7-19 GM/118ML RE ENEM: 1.0000 | ENEMA | Freq: Once | RECTAL | Status: DC | PRN

## 2015-03-18 MED ORDER — GLYCOPYRROLATE 0.2 MG/ML IJ SOLN
INTRAMUSCULAR | Status: AC
  Filled 2015-03-18: qty 4

## 2015-03-18 MED ORDER — ONDANSETRON HCL 4 MG/2ML IJ SOLN
INTRAMUSCULAR | Status: DC | PRN
  Administered 2015-03-18: 4 mg via INTRAVENOUS

## 2015-03-18 SURGICAL SUPPLY — 51 items
BANDAGE ELASTIC 6 VELCRO ST LF (GAUZE/BANDAGES/DRESSINGS) ×1 IMPLANT
BLADE GREAT WHITE 4.2 (BLADE) IMPLANT
BLADE SURG 11 STRL SS (BLADE) IMPLANT
BLADE SURG ROTATE 9660 (MISCELLANEOUS) IMPLANT
BNDG GAUZE ELAST 4 BULKY (GAUZE/BANDAGES/DRESSINGS) ×2 IMPLANT
COVER SURGICAL LIGHT HANDLE (MISCELLANEOUS) ×2 IMPLANT
DRAPE ARTHROSCOPY W/POUCH 114 (DRAPES) ×2 IMPLANT
DRAPE U-SHAPE 47X51 STRL (DRAPES) ×2 IMPLANT
DRSG PAD ABDOMINAL 8X10 ST (GAUZE/BANDAGES/DRESSINGS) IMPLANT
DURAPREP 26ML APPLICATOR (WOUND CARE) ×2 IMPLANT
EVACUATOR 1/8 PVC DRAIN (DRAIN) ×1 IMPLANT
FILTER STRAW FLUID ASPIR (MISCELLANEOUS) ×2 IMPLANT
GAUZE SPONGE 4X4 12PLY STRL (GAUZE/BANDAGES/DRESSINGS) IMPLANT
GAUZE XEROFORM 1X8 LF (GAUZE/BANDAGES/DRESSINGS) IMPLANT
GLOVE BIOGEL PI IND STRL 6.5 (GLOVE) IMPLANT
GLOVE BIOGEL PI IND STRL 8 (GLOVE) ×2 IMPLANT
GLOVE BIOGEL PI INDICATOR 6.5 (GLOVE) ×1
GLOVE BIOGEL PI INDICATOR 8 (GLOVE) ×2
GLOVE ORTHO TXT STRL SZ7.5 (GLOVE) ×5 IMPLANT
GLOVE SURG ORTHO 8.0 STRL STRW (GLOVE) ×5 IMPLANT
GLOVE SURG SS PI 6.5 STRL IVOR (GLOVE) ×1 IMPLANT
GOWN STRL REUS W/ TWL LRG LVL3 (GOWN DISPOSABLE) ×2 IMPLANT
GOWN STRL REUS W/ TWL XL LVL3 (GOWN DISPOSABLE) ×1 IMPLANT
GOWN STRL REUS W/TWL LRG LVL3 (GOWN DISPOSABLE) ×4
GOWN STRL REUS W/TWL XL LVL3 (GOWN DISPOSABLE) ×2
IMMOBILIZER KNEE 22 UNIV (SOFTGOODS) ×1 IMPLANT
KIT ROOM TURNOVER OR (KITS) ×2 IMPLANT
MANIFOLD NEPTUNE II (INSTRUMENTS) IMPLANT
NDL 18GX1X1/2 (RX/OR ONLY) (NEEDLE) IMPLANT
NDL HYPO 25GX1X1/2 BEV (NEEDLE) IMPLANT
NDL SPNL 18GX3.5 QUINCKE PK (NEEDLE) IMPLANT
NEEDLE 18GX1X1/2 (RX/OR ONLY) (NEEDLE) IMPLANT
NEEDLE HYPO 25GX1X1/2 BEV (NEEDLE) IMPLANT
NEEDLE SPNL 18GX3.5 QUINCKE PK (NEEDLE) IMPLANT
NS IRRIG 1000ML POUR BTL (IV SOLUTION) IMPLANT
PACK ARTHROSCOPY DSU (CUSTOM PROCEDURE TRAY) ×2 IMPLANT
PAD ARMBOARD 7.5X6 YLW CONV (MISCELLANEOUS) ×4 IMPLANT
PAD CAST 4YDX4 CTTN HI CHSV (CAST SUPPLIES) IMPLANT
PADDING CAST COTTON 4X4 STRL (CAST SUPPLIES) ×2
SET ARTHROSCOPY TUBING (MISCELLANEOUS) ×2
SET ARTHROSCOPY TUBING LN (MISCELLANEOUS) ×1 IMPLANT
SPONGE GAUZE 4X4 12PLY STER LF (GAUZE/BANDAGES/DRESSINGS) ×1 IMPLANT
SPONGE LAP 4X18 X RAY DECT (DISPOSABLE) ×2 IMPLANT
SUT ETHILON 4 0 PS 2 18 (SUTURE) ×2 IMPLANT
SYR 20ML ECCENTRIC (SYRINGE) IMPLANT
SYR 3ML 25GX5/8 SAFETY (SYRINGE) ×2 IMPLANT
SYR CONTROL 10ML LL (SYRINGE) IMPLANT
TOWEL OR 17X24 6PK STRL BLUE (TOWEL DISPOSABLE) ×2 IMPLANT
TOWEL OR 17X26 10 PK STRL BLUE (TOWEL DISPOSABLE) ×2 IMPLANT
TUBE CONNECTING 12X1/4 (SUCTIONS) ×2 IMPLANT
WATER STERILE IRR 1000ML POUR (IV SOLUTION) ×2 IMPLANT

## 2015-03-18 NOTE — H&P (Signed)
ORTHOPAEDIC CONSULTATION  REQUESTING PHYSICIAN: Earlie Server, MD  Chief Complaint: L knee pain and swelling  HPI: Adam Tyler is a 59 y.o. male who complains of L knee pain and swelling that started yesterday.  He also reports chronic drainage out of the medial protal site beginning this morning.  The patient had arthroscopic knee surgery on 03/08/15 by Dr. French Ana.  He was seen in clinic on 03/15/15 and was doing well without complaints.  The patient has been trying to control the pain by taking 2 of the percocet pain pills every 4hrs.  He also admits to significant alcohol consumption in the past 24hrs.  His last food/drink was this morning around 8am.  He denies recent fever or chills.  He has noticed red streaking on the lateral aspect of the knee.  He is unable to weight bear at this time due to pain.    Past Medical History  Diagnosis Date  . GERD (gastroesophageal reflux disease)   . Arthritis   . Hypertension   . Upper respiratory infection 05/17/13    no fever since 05/18/13  . Cancer 2007    melanoma forehead   . Anxiety     h/o panic attacks from " stress"  . Sciatica of left side     per pt  "buldging disc- lumbar"  . Sleep apnea     uses CPAP  . Acute medial meniscal injury of knee     left   Past Surgical History  Procedure Laterality Date  . Hernia repair    . Tonsillectomy    . Hand surgery    . Skin graft Right   . Mass excision Right 06/19/2013    Procedure: EXCISION MASS RIGHT POSTERIOR NECK;  Surgeon: Earnstine Regal, MD;  Location: [facility];  Service: General;  Laterality: Right;  . Knee arthroscopy with medial menisectomy Left 03/09/2015    Procedure: KNEE ARTHROSCOPY WITH  PARTIAL MEDIAL MENISECTOMY;  Surgeon: Earlie Server, MD;  Location: [facility];  Service: Orthopedics;  Laterality: Left;  . Chondroplasty Left 03/09/2015    Procedure: CHONDROPLASTY;  Surgeon: Earlie Server, MD;  Location: [facility];  Service:  Orthopedics;  Laterality: Left;   Social History   Social History  . Marital Status: Married    Spouse Name: N/A  . Number of Children: N/A  . Years of Education: N/A   Social History Main Topics  . Smoking status: Current Every Day Smoker -- 0.50 packs/day  . Smokeless tobacco: Never Used  . Alcohol Use: Yes     Comment: pt said all he can /      3-4 beers night  . Drug Use: No  . Sexual Activity: Yes   Other Topics Concern  . Not on file   Social History Narrative   Family History  Problem Relation Age of Onset  . Cancer Mother     lymphnode  . Stroke Father    No Known Allergies Prior to Admission medications   Medication Sig Start Date End Date Taking? Authorizing Provider  aspirin EC 325 MG tablet Take 1 tablet (325 mg total) by mouth 2 (two) times daily. 03/09/15   Chriss Czar, PA-C  HYDROcodone-acetaminophen (NORCO) 7.5-325 MG per tablet Take 1-2 tablets by mouth every 4 (four) hours as needed for moderate pain. 03/09/15   Chriss Czar, PA-C  losartan-hydrochlorothiazide (HYZAAR) 100-12.5 MG per tablet Take 1 tablet by mouth daily.    Historical Provider, MD  oxyCODONE-acetaminophen (ROXICET) 5-325 MG per tablet Take 1-2 tablets by mouth every 4 (four) hours as needed for moderate pain. 06/19/13   Armandina Gemma, MD  venlafaxine (EFFEXOR) 75 MG tablet Take 75 mg by mouth 2 (two) times daily.    Historical Provider, MD   No results found.  Positive ROS: All other systems have been reviewed and were otherwise negative with the exception of those mentioned in the HPI and as above.  Labs cbc No results for input(s): WBC, HGB, HCT, PLT in the last 72 hours.  Labs inflam No results for input(s): CRP in the last 72 hours.  Invalid input(s): ESR  Labs coag No results for input(s): INR, PTT in the last 72 hours.  Invalid input(s): PT  No results for input(s): NA, K, CL, CO2, GLUCOSE, BUN, CREATININE, CALCIUM in the last 72 hours.  Physical Exam: There were no  vitals filed for this visit. General: Alert, in significant pain with any movement of the L knee Cardiovascular: No pedal edema Respiratory: No cyanosis, no use of accessory musculature GI: No organomegaly, abdomen is soft and non-tender Skin: No lesions in the area of chief complaint other than those listed below in MSK exam.  Neurologic: Sensation intact distally Psychiatric: Patient is competent for consent with normal mood and affect Lymphatic: No axillary or cervical lymphadenopathy  MUSCULOSKELETAL:  L knee has moderate effusion with warmth to the touch.  Purulent drainage is noted out of the medial portal.  The knee has some erythema over the lateral knee where the patient said a bandage irritated his skin.  (per the patient the area of redness seems to be increasing in the past 24hrs).  ROM is significantly limited due to effusion and pain.  Sensation is intact with 2+ distal pulses.  Other extremities are atraumatic with painless ROM and NVI.  Assessment: L knee pain and effusion concerning for infection given purulent fluid draining from the medial portal   Plan: In the office today, we had increased concern for the patient to possibly have a septic knee infection.  He has not had any recent fevers and is afebrile in the office today, but purulent fluid is draining out of the medial portal of the L knee.  He also has significant pain and decreased ROM of the knee.  We aspirated some of the joint fluid today in the office and sent for culture and gram stain.  Dr. French Ana has been notified.  The patient was schedule for I/D of the L knee today.  He will remain NPO till surgery.   [facility] [phone]   03/18/2015 12:18 PM

## 2015-03-18 NOTE — Anesthesia Procedure Notes (Signed)
Procedure Name: LMA Insertion Date/Time: 03/18/2015 8:02 PM Performed by: Barkley Boards L Pre-anesthesia Checklist: Patient identified, Emergency Drugs available, Suction available and Patient being monitored Patient Re-evaluated:Patient Re-evaluated prior to inductionOxygen Delivery Method: Circle system utilized Preoxygenation: Pre-oxygenation with 100% oxygen Intubation Type: IV induction Ventilation: Mask ventilation without difficulty LMA: LMA inserted LMA Size: 5.0 Number of attempts: 1 Placement Confirmation: positive ETCO2,  CO2 detector and breath sounds checked- equal and bilateral Tube secured with: Tape Dental Injury: Teeth and Oropharynx as per pre-operative assessment

## 2015-03-18 NOTE — Anesthesia Preprocedure Evaluation (Signed)
Anesthesia Evaluation  Patient identified by MRN, date of birth, ID band Patient awake    Reviewed: Allergy & Precautions, NPO status , Patient's Chart, lab work & pertinent test results  History of Anesthesia Complications Negative for: history of anesthetic complications  Airway Mallampati: II  TM Distance: >3 FB Neck ROM: Full    Dental  (+) Partial Upper, Dental Advisory Given   Pulmonary sleep apnea , Current Smoker,    Pulmonary exam normal        Cardiovascular hypertension, Normal cardiovascular exam     Neuro/Psych PSYCHIATRIC DISORDERS Anxiety    GI/Hepatic Neg liver ROS, GERD  ,  Endo/Other  negative endocrine ROS  Renal/GU negative Renal ROS     Musculoskeletal   Abdominal   Peds  Hematology negative hematology ROS (+)   Anesthesia Other Findings   Reproductive/Obstetrics                             Anesthesia Physical Anesthesia Plan  ASA: II  Anesthesia Plan: General   Post-op Pain Management:    Induction: Intravenous  Airway Management Planned: LMA  Additional Equipment:   Intra-op Plan:   Post-operative Plan: Extubation in OR  Informed Consent: I have reviewed the patients History and Physical, chart, labs and discussed the procedure including the risks, benefits and alternatives for the proposed anesthesia with the patient or authorized representative who has indicated his/her understanding and acceptance.   Dental advisory given  Plan Discussed with: CRNA, Anesthesiologist and Surgeon  Anesthesia Plan Comments:         Anesthesia Quick Evaluation

## 2015-03-18 NOTE — Brief Op Note (Signed)
03/18/2015  8:55 PM  PATIENT:  Adam Tyler  59 y.o. male  PRE-OPERATIVE DIAGNOSIS:  INFECTION  POST-OPERATIVE DIAGNOSIS:  Post op infection left knee  PROCEDURE:  Procedure(s): ARTHROSCOPY IRRIGATION AND DEBRIDEMENT OF KNEE  (N/A)  SURGEON:  Surgeon(s) and Role:    * Earlie Server, MD - Primary  PHYSICIAN ASSISTANT: Chriss Czar, PA-c  ASSISTANTS:   ANESTHESIA:   general  EBL:  Total I/O In: 31 [I.V.:800] Out: -   BLOOD ADMINISTERED:none  DRAINS: hemovac drain two portals to one drain left knee self suction   LOCAL MEDICATIONS USED:  NONE  SPECIMEN:  No Specimen and Aspirate  DISPOSITION OF SPECIMEN:  lab and micro  COUNTS:  YES  TOURNIQUET:  * No tourniquets in log *  DICTATION: .Other Dictation: Dictation Number [id number]  PLAN OF CARE: Admit to inpatient   PATIENT DISPOSITION:  PACU - hemodynamically stable.   Delay start of Pharmacological VTE agent (>24hrs) due to surgical blood loss or risk of bleeding: yes

## 2015-03-18 NOTE — Transfer of Care (Signed)
Immediate Anesthesia Transfer of Care Note  Patient: Adam Tyler  Procedure(s) Performed: Procedure(s): ARTHROSCOPIC, LAVAGE, SYNOVECTOMY LEFT KNEE  (Left)  Patient Location: PACU  Anesthesia Type:General  Level of Consciousness: sedated  Airway & Oxygen Therapy: Patient Spontanous Breathing and Patient connected to nasal cannula oxygen  Post-op Assessment: Report given to RN and Post -op Vital signs reviewed and stable  Post vital signs: stable  Last Vitals:  Filed Vitals:   03/18/15 1900  BP: 153/77  Pulse: 91  Temp: 37.8 C  Resp: 18    Complications: No apparent anesthesia complications

## 2015-03-18 NOTE — Anesthesia Postprocedure Evaluation (Signed)
Anesthesia Post Note  Patient: Adam Tyler  Procedure(s) Performed: Procedure(s) (LRB): ARTHROSCOPIC, LAVAGE, SYNOVECTOMY LEFT KNEE  (Left)  Anesthesia type: general  Patient location: PACU  Post pain: Pain level controlled  Post assessment: Patient's Cardiovascular Status Stable  Last Vitals:  Filed Vitals:   03/18/15 2145  BP:   Pulse:   Temp: 36.5 C  Resp:     Post vital signs: Reviewed and stable  Level of consciousness: sedated  Complications: No apparent anesthesia complications

## 2015-03-19 ENCOUNTER — Encounter (HOSPITAL_COMMUNITY): Payer: Self-pay | Admitting: Infectious Disease

## 2015-03-19 DIAGNOSIS — I10 Essential (primary) hypertension: Secondary | ICD-10-CM

## 2015-03-19 DIAGNOSIS — Z118 Encounter for screening for other infectious and parasitic diseases: Secondary | ICD-10-CM

## 2015-03-19 DIAGNOSIS — F17210 Nicotine dependence, cigarettes, uncomplicated: Secondary | ICD-10-CM

## 2015-03-19 DIAGNOSIS — M00062 Staphylococcal arthritis, left knee: Secondary | ICD-10-CM | POA: Insufficient documentation

## 2015-03-19 DIAGNOSIS — Z113 Encounter for screening for infections with a predominantly sexual mode of transmission: Secondary | ICD-10-CM | POA: Insufficient documentation

## 2015-03-19 DIAGNOSIS — B9561 Methicillin susceptible Staphylococcus aureus infection as the cause of diseases classified elsewhere: Secondary | ICD-10-CM

## 2015-03-19 DIAGNOSIS — F17200 Nicotine dependence, unspecified, uncomplicated: Secondary | ICD-10-CM | POA: Insufficient documentation

## 2015-03-19 DIAGNOSIS — T8149XA Infection following a procedure, other surgical site, initial encounter: Secondary | ICD-10-CM | POA: Diagnosis present

## 2015-03-19 DIAGNOSIS — Z114 Encounter for screening for human immunodeficiency virus [HIV]: Secondary | ICD-10-CM

## 2015-03-19 DIAGNOSIS — T814XXA Infection following a procedure, initial encounter: Principal | ICD-10-CM

## 2015-03-19 DIAGNOSIS — A4901 Methicillin susceptible Staphylococcus aureus infection, unspecified site: Secondary | ICD-10-CM | POA: Insufficient documentation

## 2015-03-19 LAB — CBC WITH DIFFERENTIAL/PLATELET
BASOS ABS: 0 10*3/uL (ref 0.0–0.1)
BASOS PCT: 0 %
EOS ABS: 0.2 10*3/uL (ref 0.0–0.7)
EOS PCT: 2 %
HEMATOCRIT: 42 % (ref 39.0–52.0)
Hemoglobin: 14 g/dL (ref 13.0–17.0)
Lymphocytes Relative: 18 %
Lymphs Abs: 1.8 10*3/uL (ref 0.7–4.0)
MCH: 31.7 pg (ref 26.0–34.0)
MCHC: 33.3 g/dL (ref 30.0–36.0)
MCV: 95 fL (ref 78.0–100.0)
MONO ABS: 1.5 10*3/uL — AB (ref 0.1–1.0)
MONOS PCT: 16 %
NEUTROS ABS: 6.3 10*3/uL (ref 1.7–7.7)
Neutrophils Relative %: 64 %
PLATELETS: 204 10*3/uL (ref 150–400)
RBC: 4.42 MIL/uL (ref 4.22–5.81)
RDW: 13.6 % (ref 11.5–15.5)
WBC: 9.8 10*3/uL (ref 4.0–10.5)

## 2015-03-19 LAB — COMPREHENSIVE METABOLIC PANEL
ALK PHOS: 68 U/L (ref 38–126)
ALT: 134 U/L — AB (ref 17–63)
AST: 38 U/L (ref 15–41)
Albumin: 3.3 g/dL — ABNORMAL LOW (ref 3.5–5.0)
Anion gap: 7 (ref 5–15)
BILIRUBIN TOTAL: 1.1 mg/dL (ref 0.3–1.2)
BUN: 9 mg/dL (ref 6–20)
CALCIUM: 8.6 mg/dL — AB (ref 8.9–10.3)
CO2: 30 mmol/L (ref 22–32)
CREATININE: 0.87 mg/dL (ref 0.61–1.24)
Chloride: 99 mmol/L — ABNORMAL LOW (ref 101–111)
GFR calc non Af Amer: >60 mL/min (ref >60)
Glucose, Bld: 140 mg/dL — ABNORMAL HIGH (ref 65–99)
Potassium: 3.8 mmol/L (ref 3.5–5.1)
Sodium: 136 mmol/L (ref 135–145)
TOTAL PROTEIN: 5.8 g/dL — AB (ref 6.5–8.1)

## 2015-03-19 NOTE — Progress Notes (Signed)
Patient ID: Adam Tyler, male   DOB: 1955-12-13, 59 y.o.   MRN: [id number]  Call report from [facility]:  Negative gram stain but culture did grow out rare Staph Aureus

## 2015-03-19 NOTE — Progress Notes (Signed)
Subjective: 1 Day Post-Op Procedure(s) (LRB): ARTHROSCOPIC, LAVAGE, SYNOVECTOMY LEFT KNEE  (Left) Patient reports pain as mild and moderate.    Objective: Vital signs in last 24 hours: Temp:  [97.7 F (36.5 C)-100 F (37.8 C)] 98.1 F (36.7 C) (10/01 0600) Pulse Rate:  [46-93] 83 (10/01 0900) Resp:  [16-18] 16 (10/01 0900) BP: (113-177)/(67-99) 137/67 mmHg (10/01 0900) SpO2:  [94 %-100 %] 96 % (10/01 0900)  Intake/Output from previous day: 09/30 0701 - 10/01 0700 In: 1400 [I.V.:1400] Out: 700 [Urine:700] Intake/Output this shift:    No results for input(s): HGB in the last 72 hours. No results for input(s): WBC, RBC, HCT, PLT in the last 72 hours.  Recent Labs  03/19/15 0735  NA 136  K 3.8  CL 99*  CO2 30  BUN 9  CREATININE 0.87  GLUCOSE 140*  CALCIUM 8.6*   No results for input(s): LABPT, INR in the last 72 hours.  Neurovascular intact Sensation intact distally Intact pulses distally Dorsiflexion/Plantar flexion intact Incision: dressing C/D/I  Assessment/Plan: 1 Day Post-Op Procedure(s) (LRB): ARTHROSCOPIC, LAVAGE, SYNOVECTOMY LEFT KNEE  (Left) Up with therapy   Remain in knee immobilizer Consult ID for abx recs wbat in knee immobilizer   Adam Tyler 03/19/2015, 11:30 AM

## 2015-03-19 NOTE — Consult Note (Addendum)
[facility] for Infectious Disease    Date of Admission:  03/18/2015  Date of Consult:  03/19/2015  Reason for Consult: postoperative septic knee infection Referring Physician: Dr, French Ana   HPI: Adam Tyler is an 59 y.o. male who underwent arthroscopic knee surgery on 03/08/2015 by Dr. French Ana.the patient has wife claim that he had alreadydeveloped drainage from the operative site was seen in the clinic on September 27 at that time was thought to be doing well. However his pain worsened and he developed red streaking on the lateral aspect of knee. He was unable to bear weight due to the severity of his pain. He was brought into the hospital and thankfully not given preoperative antibody X and taken the operating room by Dr. French Ana where he underwent:  1. Arthroscopic lavage. 2. Arthroscopic synovectomy, left knee.  He was based on vancomycin and cefazolin postoperatively in my understanding is that the patient is now growing Staphylococcus aureus from intraoperative culture.     Past Medical History  Diagnosis Date  . GERD (gastroesophageal reflux disease)   . Arthritis   . Hypertension   . Upper respiratory infection 05/17/13    no fever since 05/18/13  . Cancer 2007    melanoma forehead   . Anxiety     h/o panic attacks from " stress"  . Sciatica of left side     per pt  "buldging disc- lumbar"  . Sleep apnea     uses CPAP  . Acute medial meniscal injury of knee     left    Past Surgical History  Procedure Laterality Date  . Hernia repair    . Tonsillectomy    . Hand surgery    . Skin graft Right   . Mass excision Right 06/19/2013    Procedure: EXCISION MASS RIGHT POSTERIOR NECK;  Surgeon: Earnstine Regal, MD;  Location: [facility];  Service: General;  Laterality: Right;  . Knee arthroscopy with medial menisectomy Left 03/09/2015    Procedure: KNEE ARTHROSCOPY WITH  PARTIAL MEDIAL MENISECTOMY;  Surgeon: Earlie Server, MD;  Location: [facility];  Service: Orthopedics;  Laterality: Left;  . Chondroplasty Left 03/09/2015    Procedure: CHONDROPLASTY;  Surgeon: Earlie Server, MD;  Location: [facility];  Service: Orthopedics;  Laterality: Left;    Social History:  reports that he has been smoking Cigarettes.  He has a 10 pack-year smoking history. He has never used smokeless tobacco. He reports that he drinks alcohol. He reports that he does not use illicit drugs.   Family History  Problem Relation Age of Onset  . Cancer Mother     lymphnode  . Stroke Father     Allergies  Allergen Reactions  . Adhesive [Tape] Other (See Comments)    Leg raw and red, burning from adhesive bandage 03/18/15  . Strawberry Hives and Nausea And Vomiting     Medications: I have reviewed patients current medications as documented in Epic Anti-infectives    Start     Dose/Rate Route Frequency Ordered Stop   03/19/15 0600  ceFAZolin (ANCEF) IVPB 2 g/50 mL premix  Status:  Discontinued     2 g 100 mL/hr over 30 Minutes Intravenous To ShortStay Surgical 03/18/15 1248 03/18/15 2157   03/19/15 0400  vancomycin (VANCOCIN) IVPB 1000 mg/200 mL premix    Comments:  Dose adjusted based on weight/renal function   1,000 mg 200 mL/hr  over 60 Minutes Intravenous 3 times per day 03/18/15 2205 03/23/15 0559   03/18/15 2300  ceFAZolin (ANCEF) IVPB 2 g/50 mL premix     2 g 100 mL/hr over 30 Minutes Intravenous 3 times per day 03/18/15 2205 03/22/15 2159         ROS: as per history of present illness and pertinent for red streaking down the leg with severe pain, otherwise as in history present illness and remainder of 12 point review of systems is negative   Blood pressure 137/67, pulse 83, temperature 98.1 F (36.7 C), resp. rate 16, SpO2 96 %.   General: Alert and awake, oriented x3, not in any acute distress. HEENT: anicteric sclera,  EOMI, oropharynx clear and without exudate Cardiovascular: egular rate, normal r,  no  murmur rubs or gallops Pulmonary: clear to auscultation bilaterally, no wheezing, rales or rhonchi Gastrointestinal: soft nontender, nondistended, normal bowel sounds, Musculoskeletal: left knee is wrapped Skin, soft tissue: no rashes Neuro: nonfocal, strength and sensation intact   Results for orders placed or performed during the hospital encounter of 03/18/15 (from the past 48 hour(s))  Anaerobic culture     Status: None (Preliminary result)   Collection Time: 03/18/15  8:21 PM  Result Value Ref Range   Specimen Description SYNOVIAL LEFT KNEE    Special Requests NONE    Gram Stain      NO ANAEROBES ISOLATED; CULTURE IN PROGRESS FOR 5 DAYS   Culture PENDING    Report Status PENDING   Body fluid culture     Status: None (Preliminary result)   Collection Time: 03/18/15  8:21 PM  Result Value Ref Range   Specimen Description SYNOVIAL LEFT KNEE    Special Requests NONE    Gram Stain      ABUNDANT WBC PRESENT, PREDOMINANTLY PMN NO ORGANISMS SEEN    Culture NO GROWTH < 12 HOURS    Report Status PENDING   Synovial cell count + diff, w/ crystals     Status: Abnormal   Collection Time: 03/18/15  8:49 PM  Result Value Ref Range   Color, Synovial RED (A) YELLOW   Appearance-Synovial TURBID (A) CLEAR   Crystals, Fluid NO CRYSTALS SEEN    WBC, Synovial Specimen clotted, unable to perform count 0 - 200 /cu mm   Neutrophil, Synovial 96 (H) 0 - 25 %   Monocyte-Macrophage-Synovial Fluid 4 (L) 50 - 90 %  Comprehensive metabolic panel     Status: Abnormal   Collection Time: 03/19/15  7:35 AM  Result Value Ref Range   Sodium 136 135 - 145 mmol/L   Potassium 3.8 3.5 - 5.1 mmol/L   Chloride 99 (L) 101 - 111 mmol/L   CO2 30 22 - 32 mmol/L   Glucose, Bld 140 (H) 65 - 99 mg/dL   BUN 9 6 - 20 mg/dL   Creatinine, Ser 0.87 0.61 - 1.24 mg/dL   Calcium 8.6 (L) 8.9 - 10.3 mg/dL   Total Protein 5.8 (L) 6.5 - 8.1 g/dL   Albumin 3.3 (L) 3.5 - 5.0 g/dL   AST 38 15 - 41 U/L   ALT 134 (H) 17 - 63  U/L   Alkaline Phosphatase 68 38 - 126 U/L   Total Bilirubin 1.1 0.3 - 1.2 mg/dL   GFR calc non Af Amer >60 >60 mL/min   GFR calc Af Amer >60 >60 mL/min    Comment: (NOTE) The eGFR has been calculated using the CKD EPI equation. This calculation has not been validated  in all clinical situations. eGFR's persistently <60 mL/min signify possible Chronic Kidney Disease.    Anion gap 7 5 - 15   _0 (sdes,specrequest,cult,reptstatus)   ) Recent Results (from the past 720 hour(s))  Anaerobic culture     Status: None (Preliminary result)   Collection Time: 03/18/15  8:21 PM  Result Value Ref Range Status   Specimen Description SYNOVIAL LEFT KNEE  Final   Special Requests NONE  Final   Gram Stain   Final    NO ANAEROBES ISOLATED; CULTURE IN PROGRESS FOR 5 DAYS   Culture PENDING  Incomplete   Report Status PENDING  Incomplete  Body fluid culture     Status: None (Preliminary result)   Collection Time: 03/18/15  8:21 PM  Result Value Ref Range Status   Specimen Description SYNOVIAL LEFT KNEE  Final   Special Requests NONE  Final   Gram Stain   Final    ABUNDANT WBC PRESENT, PREDOMINANTLY PMN NO ORGANISMS SEEN    Culture NO GROWTH < 12 HOURS  Final   Report Status PENDING  Incomplete     Impression/Recommendation  Active Problems:   Wound infection after surgery   Adam Tyler is a 59 y.o. male with  Postoperative septic knee with Staphylococcus aureus  #1 Septic knee with Staphylococcus aureus: I agree with vancomycin and cefazolin pending susceptibility testing. We will check a baseline sedimentation rate and C-reactive protein.  Monitor metabolic panel and CBC, monitor vancomycin levels per pharmacy.  Note if the patient grows methicillin-resistant staph aureus and requires vancomycin he will need to be sent out with a pharmacist whether that be home health or SNF dosing the vancomycin per a protocol not giving any fixed dosing of this medicine  #2 Screening:  we'll screen for hep C and HIV.  #3 smoking counseled him to stop smoking cigarettes  #4 HTN: I stopped his hydrochlorothiazide and would even consider holding his angiotensin receptor blocker while he is getting his IV vancomycin recovering from his postoperative state reduce risk of vancomycin-related nephrotoxicity.  03/19/2015, 2:24 PM   Thank you so much for this interesting consult  [place] [facility] [place] [phone] (pager) [phone] (office) 03/19/2015, 2:24 PM  [name] 03/19/2015, 2:24 PM

## 2015-03-19 NOTE — Op Note (Signed)
NAMEZORIAN, GUNDERMAN NO.:  [id number]  MEDICAL RECORD NO.:  [id number]  LOCATION:  5N29C                        FACILITY:  [facility]  PHYSICIAN:  Lockie Pares, M.D.    DATE OF BIRTH:  1956-04-05  DATE OF PROCEDURE: DATE OF DISCHARGE:                              OPERATIVE REPORT   PREOPERATIVE DIAGNOSIS:  Pyarthrosis of left knee.  POSTOPERATIVE DIAGNOSIS:  Pyarthrosis of left knee.  OPERATION: 1. Arthroscopic lavage. 2. Arthroscopic synovectomy, left knee.  SURGEON:  Lockie Pares, M.D.  ASSISTANT:  Chriss Czar, PA-C.  ANESTHESIA:  General anesthetic.  DESCRIPTION OF PROCEDURE:  Cultures were obtained.  The patient was given a gram of vancomycin.  Purulent material was immediately expressed from the knee.  We did a synovectomy intra-articularly.  The articular surfaces looked good, relative to significant damage from the infection. 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  We did aggressive synovectomy particularly on the medial side where the drain was most noticeable.  We did then total irrigation of 12,000 mL.  Remainder intra-articular exam was normal.  Hemovac drain was placed exiting the portals, lightly compressive sterile dressing, knee immobilizer applied.  Taken to the recovery room in stable condition.     Lockie Pares, M.D.     WDC/MEDQ  D:  03/18/2015  T:  03/19/2015  Job:  [id number]

## 2015-03-20 ENCOUNTER — Encounter (HOSPITAL_COMMUNITY): Payer: Self-pay | Admitting: Orthopedic Surgery

## 2015-03-20 DIAGNOSIS — Z789 Other specified health status: Secondary | ICD-10-CM | POA: Insufficient documentation

## 2015-03-20 DIAGNOSIS — R7989 Other specified abnormal findings of blood chemistry: Secondary | ICD-10-CM | POA: Insufficient documentation

## 2015-03-20 DIAGNOSIS — R945 Abnormal results of liver function studies: Secondary | ICD-10-CM

## 2015-03-20 DIAGNOSIS — F1090 Alcohol use, unspecified, uncomplicated: Secondary | ICD-10-CM | POA: Insufficient documentation

## 2015-03-20 LAB — CBC WITH DIFFERENTIAL/PLATELET
BASOS ABS: 0 10*3/uL (ref 0.0–0.1)
BASOS PCT: 0 %
EOS PCT: 1 %
Eosinophils Absolute: 0.1 10*3/uL (ref 0.0–0.7)
HCT: 40.6 % (ref 39.0–52.0)
Hemoglobin: 13.6 g/dL (ref 13.0–17.0)
Lymphocytes Relative: 15 %
Lymphs Abs: 1.6 10*3/uL (ref 0.7–4.0)
MCH: 31.6 pg (ref 26.0–34.0)
MCHC: 33.5 g/dL (ref 30.0–36.0)
MCV: 94.4 fL (ref 78.0–100.0)
MONO ABS: 1.3 10*3/uL — AB (ref 0.1–1.0)
Monocytes Relative: 12 %
Neutro Abs: 7.9 10*3/uL — ABNORMAL HIGH (ref 1.7–7.7)
Neutrophils Relative %: 72 %
PLATELETS: 197 10*3/uL (ref 150–400)
RBC: 4.3 MIL/uL (ref 4.22–5.81)
RDW: 13.5 % (ref 11.5–15.5)
WBC: 11 10*3/uL — ABNORMAL HIGH (ref 4.0–10.5)

## 2015-03-20 LAB — COMPREHENSIVE METABOLIC PANEL
ALK PHOS: 66 U/L (ref 38–126)
ALT: 78 U/L — ABNORMAL HIGH (ref 17–63)
ANION GAP: 7 (ref 5–15)
AST: 24 U/L (ref 15–41)
Albumin: 3 g/dL — ABNORMAL LOW (ref 3.5–5.0)
BUN: 11 mg/dL (ref 6–20)
CALCIUM: 8.4 mg/dL — AB (ref 8.9–10.3)
CO2: 27 mmol/L (ref 22–32)
Chloride: 100 mmol/L — ABNORMAL LOW (ref 101–111)
Creatinine, Ser: 0.81 mg/dL (ref 0.61–1.24)
GFR calc non Af Amer: >60 mL/min (ref >60)
Glucose, Bld: 151 mg/dL — ABNORMAL HIGH (ref 65–99)
Potassium: 3.9 mmol/L (ref 3.5–5.1)
SODIUM: 134 mmol/L — AB (ref 135–145)
TOTAL PROTEIN: 5.8 g/dL — AB (ref 6.5–8.1)
Total Bilirubin: 0.6 mg/dL (ref 0.3–1.2)

## 2015-03-20 LAB — C-REACTIVE PROTEIN: CRP: 17.2 mg/dL — AB (ref <1.0)

## 2015-03-20 LAB — SEDIMENTATION RATE: Sed Rate: 40 mm/hr — ABNORMAL HIGH (ref 0–16)

## 2015-03-20 LAB — HIV ANTIBODY (ROUTINE TESTING W REFLEX): HIV SCREEN 4TH GENERATION: NONREACTIVE

## 2015-03-20 NOTE — Progress Notes (Signed)
Utilization Review Completed.Adam Tyler T10/07/2014  

## 2015-03-20 NOTE — Progress Notes (Signed)
Baldo Ash, PA returned page. Informed of critical lab - stated was informed of result on Friday.

## 2015-03-20 NOTE — Progress Notes (Signed)
Subjective: 2 Days Post-Op Procedure(s) (LRB): ARTHROSCOPIC, LAVAGE, SYNOVECTOMY LEFT KNEE  (Left) Patient reports pain as moderate.    Objective: Vital signs in last 24 hours: Temp:  [98.4 F (36.9 C)-102.3 F (39.1 C)] 98.4 F (36.9 C) (10/02 0607) Pulse Rate:  [82-108] 82 (10/02 0607) Resp:  [16-18] 16 (10/02 0607) BP: (132-151)/(58-76) 137/76 mmHg (10/02 0607) SpO2:  [94 %-97 %] 95 % (10/02 0607)  Intake/Output from previous day: 10/01 0701 - 10/02 0700 In: 720 [P.O.:720] Out: 70 [Drains:70] Intake/Output this shift: Total I/O In: 240 [P.O.:240] Out: -    Recent Labs  03/19/15 1530  HGB 14.0    Recent Labs  03/19/15 1530  WBC 9.8  RBC 4.42  HCT 42.0  PLT 204    Recent Labs  03/19/15 0735 03/20/15 0623  NA 136 134*  K 3.8 3.9  CL 99* 100*  CO2 30 27  BUN 9 11  CREATININE 0.87 0.81  GLUCOSE 140* 151*  CALCIUM 8.6* 8.4*   No results for input(s): LABPT, INR in the last 72 hours.  Neurovascular intact Sensation intact distally Intact pulses distally Dorsiflexion/Plantar flexion intact Incision: scant drainage No cellulitis present Compartment soft minimal ouput from HV drain pulled today  Assessment/Plan: 2 Days Post-Op Procedure(s) (LRB): ARTHROSCOPIC, LAVAGE, SYNOVECTOMY LEFT KNEE  (Left) Continue ABX therapy due to Post-op infection   Appreciate input from ID Currently on vanc and ancef  Pain controlled at worst a 5/10   Adam Tyler 03/20/2015, 9:18 AM

## 2015-03-20 NOTE — Progress Notes (Addendum)
[place] for Infectious Disease    Subjective: No new complaints   Antibiotics:  Anti-infectives    Start     Dose/Rate Route Frequency Ordered Stop   03/19/15 0600  ceFAZolin (ANCEF) IVPB 2 g/50 mL premix  Status:  Discontinued     2 g 100 mL/hr over 30 Minutes Intravenous To ShortStay Surgical 03/18/15 1248 03/18/15 2157   03/19/15 0400  vancomycin (VANCOCIN) IVPB 1000 mg/200 mL premix  Status:  Discontinued    Comments:  Dose adjusted based on weight/renal function   1,000 mg 200 mL/hr over 60 Minutes Intravenous 3 times per day 03/18/15 2205 03/20/15 1303   03/18/15 2300  ceFAZolin (ANCEF) IVPB 2 g/50 mL premix     2 g 100 mL/hr over 30 Minutes Intravenous 3 times per day 03/18/15 2205 03/22/15 2159      Medications: Scheduled Meds: . aspirin EC  325 mg Oral BID  .  ceFAZolin (ANCEF) IV  2 g Intravenous 3 times per day  . docusate sodium  100 mg Oral BID  . losartan  100 mg Oral Daily  . venlafaxine XR  75 mg Oral QPC supper   Continuous Infusions: . sodium chloride 20 mL/hr at 03/19/15 1012   PRN Meds:.acetaminophen **OR** acetaminophen, bisacodyl, diphenhydrAMINE, HYDROmorphone (DILAUDID) injection, methocarbamol **OR** methocarbamol (ROBAXIN)  IV, metoCLOPramide **OR** metoCLOPramide (REGLAN) injection, ondansetron **OR** ondansetron (ZOFRAN) IV, oxyCODONE, polyethylene glycol, sodium phosphate    Objective: Weight change:   Intake/Output Summary (Last 24 hours) at 03/20/15 1304 Last data filed at 03/20/15 0900  Gross per 24 hour  Intake    480 ml  Output     70 ml  Net    410 ml   Blood pressure 137/76, pulse 82, temperature 98.4 F (36.9 C), temperature source Oral, resp. rate 16, SpO2 95 %. Temp:  [98.4 F (36.9 C)-102.3 F (39.1 C)] 98.4 F (36.9 C) (10/02 0607) Pulse Rate:  [82-108] 82 (10/02 0607) Resp:  [16-18] 16 (10/02 0607) BP: (132-151)/(58-76) 137/76 mmHg (10/02 0607) SpO2:  [94 %-97 %] 95 % (10/02  9675)  Physical Exam: General: Alert and awake, oriented x3, not in any acute distress. HEENT: anicteric sclera, EOMI, oropharynx clear and without exudate Cardiovascular: egular rate, normal r, no murmur rubs or gallops Pulmonary: clear to auscultation bilaterally, no wheezing, rales or rhonchi Gastrointestinal: soft nontender, nondistended, normal bowel sounds, Musculoskeletal: left knee is wrapped Skin, soft tissue: no rashes Neuro: nonfocal, strength and sensation intact  CBC: CBC Latest Ref Rng 03/20/2015 03/19/2015 05/21/2013  WBC 4.0 - 10.5 K/uL 11.0(H) 9.8 8.1  Hemoglobin 13.0 - 17.0 g/dL 13.6 14.0 14.9  Hematocrit 39.0 - 52.0 % 40.6 42.0 43.7  Platelets 150 - 400 K/uL 197 204 272       BMET  Recent Labs  03/19/15 0735 03/20/15 0623  NA 136 134*  K 3.8 3.9  CL 99* 100*  CO2 30 27  GLUCOSE 140* 151*  BUN 9 11  CREATININE 0.87 0.81  CALCIUM 8.6* 8.4*     Liver Panel   Recent Labs  03/19/15 0735 03/20/15 0623  PROT 5.8* 5.8*  ALBUMIN 3.3* 3.0*  AST 38 24  ALT 134* 78*  ALKPHOS 68 66  BILITOT 1.1 0.6       Sedimentation Rate  Recent Labs  03/20/15 0623  ESRSEDRATE 40*   C-Reactive Protein  Recent Labs  03/20/15 0623  CRP 17.2*    Micro Results:  Recent Results (from the past 720 hour(s))  Anaerobic culture     Status: None (Preliminary result)   Collection Time: 03/18/15  8:21 PM  Result Value Ref Range Status   Specimen Description SYNOVIAL LEFT KNEE  Final   Special Requests NONE  Final   Gram Stain   Final    NO ANAEROBES ISOLATED; CULTURE IN PROGRESS FOR 5 DAYS   Culture PENDING  Incomplete   Report Status PENDING  Incomplete  Body fluid culture     Status: None (Preliminary result)   Collection Time: 03/18/15  8:21 PM  Result Value Ref Range Status   Specimen Description SYNOVIAL LEFT KNEE  Final   Special Requests NONE  Final   Gram Stain   Final    ABUNDANT WBC PRESENT, PREDOMINANTLY PMN NO ORGANISMS SEEN    Culture  NO GROWTH < 12 HOURS  Final   Report Status PENDING  Incomplete    Studies/Results: No results found.    Assessment/Plan:  INTERVAL HISTORY: 03/20/15: Cultures from Orthopedic office + for OX sensitive (MSSA)   Active Problems:   Wound infection after surgery   Staphylococcal arthritis of left knee (HCC)   Staphylococcus aureus infection   Smoker   Screen for STD (sexually transmitted disease)    Adam Tyler is a 59 y.o. male with  Postoperative septic knee with MS Staphylococcus aureus  #1 Septic knee with methicillin sensitive Staphylococcus aureus growing from cultures from the office.  Deep cultures still pending  Narrow to Cefazolin 2g IV q 8 hours  Make sure the deep operative cultures growing the same organism  Diagnosis: Septic arthritis Culture Result: Methicillin sensitive staph aureus from office culture  Allergies  Allergen Reactions  . Adhesive [Tape] Other (See Comments)    Leg raw and red, burning from adhesive bandage 03/18/15  . Strawberry Hives and Nausea And Vomiting    Discharge antibiotics: Cefazolin 2g IV q 8 hours  Duration: 6 weeks  End Date: October 11th.   [facility] Care Per Protocol: Labs weekly while on IV antibiotics: _x_ CBC with differential _x_ BMP _x_ CRP _x_ ESR   Fax weekly labs to [phone]  Clinic Follow Up Appt: next 4 weeks  #2 Elevated LFTs: likely from etoh: -will add hepatitis B and a testing hep C pending  #3 screening hep C and HIV are pending  #4 HTN: Now that we dont appear to have to use vancomycin, would be fine with him going back on his home bp meds at descretion of primary team since we will not need to worry so much about antibiotic related nephrotoxicity  I will arrange follow-up in our clinic in the next 4 weeks     LOS: 2 days   Adam Tyler 03/20/2015, 1:04 PM

## 2015-03-20 NOTE — Progress Notes (Signed)
ANTIBIOTIC CONSULT NOTE - INITIAL  Pharmacy Consult for Vancomycin Indication: Septic knee   Allergies  Allergen Reactions  . Adhesive [Tape] Other (See Comments)    Leg raw and red, burning from adhesive bandage 03/18/15  . Strawberry Hives and Nausea And Vomiting    Patient Measurements:    Vital Signs: Temp: 98.4 F (36.9 C) (10/02 0607) Temp Source: Oral (10/02 0607) BP: 137/76 mmHg (10/02 0607) Pulse Rate: 82 (10/02 0607) Intake/Output from previous day: 10/01 0701 - 10/02 0700 In: 720 [P.O.:720] Out: 70 [Drains:70] Intake/Output from this shift:    Labs:  Recent Labs  03/19/15 0735 03/19/15 1530 03/20/15 0623  WBC  --  9.8  --   HGB  --  14.0  --   PLT  --  204  --   CREATININE 0.87  --  0.81   Estimated Creatinine Clearance: 113.9 mL/min (by C-G formula based on Cr of 0.81). No results for input(s): VANCOTROUGH, VANCOPEAK, VANCORANDOM, GENTTROUGH, GENTPEAK, GENTRANDOM, TOBRATROUGH, TOBRAPEAK, TOBRARND, AMIKACINPEAK, AMIKACINTROU, AMIKACIN in the last 72 hours.   Microbiology: Recent Results (from the past 720 hour(s))  Anaerobic culture     Status: None (Preliminary result)   Collection Time: 03/18/15  8:21 PM  Result Value Ref Range Status   Specimen Description SYNOVIAL LEFT KNEE  Final   Special Requests NONE  Final   Gram Stain   Final    NO ANAEROBES ISOLATED; CULTURE IN PROGRESS FOR 5 DAYS   Culture PENDING  Incomplete   Report Status PENDING  Incomplete  Body fluid culture     Status: None (Preliminary result)   Collection Time: 03/18/15  8:21 PM  Result Value Ref Range Status   Specimen Description SYNOVIAL LEFT KNEE  Final   Special Requests NONE  Final   Gram Stain   Final    ABUNDANT WBC PRESENT, PREDOMINANTLY PMN NO ORGANISMS SEEN    Culture NO GROWTH < 12 HOURS  Final   Report Status PENDING  Incomplete    Medical History: Past Medical History  Diagnosis Date  . GERD (gastroesophageal reflux disease)   . Arthritis   .  Hypertension   . Upper respiratory infection 05/17/13    no fever since 05/18/13  . Cancer ([place]) 2007    melanoma forehead   . Anxiety     h/o panic attacks from " stress"  . Sciatica of left side     per pt  "buldging disc- lumbar"  . Sleep apnea     uses CPAP  . Acute medial meniscal injury of knee     left  . Arthritis, septic, knee (HCC)     Medications:  Scheduled:  . aspirin EC  325 mg Oral BID  .  ceFAZolin (ANCEF) IV  2 g Intravenous 3 times per day  . docusate sodium  100 mg Oral BID  . losartan  100 mg Oral Daily  . vancomycin  1,000 mg Intravenous 3 times per day  . venlafaxine XR  75 mg Oral QPC supper   Assessment: 63 yoM s/p arthroscopy I&D of knee 9/30. Started on vancomycin 10/1 by Orthopedics. Per note, report called from [facility], culture taken in office did grow out rare Staph Aureus. Pharmacy consulted to continue dosing of vancomycin  Vancomycin 10/1 >>  In office joint fluid 9/30>>  rare Staph Aureus (per note) Synovial fluid 9/30 >> ngtd  Goal of Therapy:  Vancomycin trough level 15-20 mcg/ml  Plan:  Obtain level prior to next scheduled  dose (5th dose) Assess level and determine the plan for subsequent doses Currently receiving 1G Q 8hr  Will continue to follow renal function, culture results, LOT, and antibiotic de-escalation plans   Darl Pikes, PharmD Clinical Pharmacist- Resident Pager: [phone]  Darl Pikes 03/20/2015,8:56 AM

## 2015-03-20 NOTE — Progress Notes (Signed)
Critical lab value received r/t synovial fluid from Lt knee - few staph aureus. Answering service, request call back.

## 2015-03-21 LAB — SEDIMENTATION RATE: Sed Rate: 67 mm/hr — ABNORMAL HIGH (ref 0–16)

## 2015-03-21 LAB — COMPREHENSIVE METABOLIC PANEL
ALT: 50 U/L (ref 17–63)
ANION GAP: 8 (ref 5–15)
AST: 22 U/L (ref 15–41)
Albumin: 3 g/dL — ABNORMAL LOW (ref 3.5–5.0)
Alkaline Phosphatase: 71 U/L (ref 38–126)
BUN: 12 mg/dL (ref 6–20)
CHLORIDE: 98 mmol/L — AB (ref 101–111)
CO2: 28 mmol/L (ref 22–32)
CREATININE: 0.79 mg/dL (ref 0.61–1.24)
Calcium: 8.9 mg/dL (ref 8.9–10.3)
GFR calc non Af Amer: >60 mL/min (ref >60)
Glucose, Bld: 133 mg/dL — ABNORMAL HIGH (ref 65–99)
Potassium: 4.2 mmol/L (ref 3.5–5.1)
SODIUM: 134 mmol/L — AB (ref 135–145)
Total Bilirubin: 0.8 mg/dL (ref 0.3–1.2)
Total Protein: 6.3 g/dL — ABNORMAL LOW (ref 6.5–8.1)

## 2015-03-21 LAB — BASIC METABOLIC PANEL
ANION GAP: 7 (ref 5–15)
BUN: 14 mg/dL (ref 6–20)
CO2: 30 mmol/L (ref 22–32)
Calcium: 9.1 mg/dL (ref 8.9–10.3)
Chloride: 99 mmol/L — ABNORMAL LOW (ref 101–111)
Creatinine, Ser: 0.89 mg/dL (ref 0.61–1.24)
GFR calc Af Amer: >60 mL/min (ref >60)
Glucose, Bld: 160 mg/dL — ABNORMAL HIGH (ref 65–99)
POTASSIUM: 4.3 mmol/L (ref 3.5–5.1)
SODIUM: 136 mmol/L (ref 135–145)

## 2015-03-21 LAB — CBC WITH DIFFERENTIAL/PLATELET
BASOS ABS: 0 10*3/uL (ref 0.0–0.1)
Basophils Relative: 0 %
Eosinophils Absolute: 0.1 10*3/uL (ref 0.0–0.7)
Eosinophils Relative: 1 %
HEMATOCRIT: 41.9 % (ref 39.0–52.0)
Hemoglobin: 13.9 g/dL (ref 13.0–17.0)
LYMPHS PCT: 13 %
Lymphs Abs: 1.6 10*3/uL (ref 0.7–4.0)
MCH: 31.5 pg (ref 26.0–34.0)
MCHC: 33.2 g/dL (ref 30.0–36.0)
MCV: 95 fL (ref 78.0–100.0)
Monocytes Absolute: 1.7 10*3/uL — ABNORMAL HIGH (ref 0.1–1.0)
Monocytes Relative: 14 %
NEUTROS ABS: 8.6 10*3/uL — AB (ref 1.7–7.7)
Neutrophils Relative %: 72 %
PLATELETS: 228 10*3/uL (ref 150–400)
RBC: 4.41 MIL/uL (ref 4.22–5.81)
RDW: 13.6 % (ref 11.5–15.5)
WBC: 12.1 10*3/uL — AB (ref 4.0–10.5)

## 2015-03-21 LAB — C-REACTIVE PROTEIN: CRP: 16.3 mg/dL — AB (ref <1.0)

## 2015-03-21 LAB — HEPATITIS C ANTIBODY (REFLEX)

## 2015-03-21 LAB — HCV COMMENT:

## 2015-03-21 MED ORDER — SODIUM CHLORIDE 0.9 % IJ SOLN: 10.0000 mL | INTRAMUSCULAR | Status: DC | PRN

## 2015-03-21 MED ORDER — CEFAZOLIN SODIUM-DEXTROSE 2-3 GM-% IV SOLR: 2.0000 g | Freq: Three times a day (TID) | INTRAVENOUS | Status: AC

## 2015-03-21 MED ORDER — OXYCODONE HCL 5 MG PO TABS: 5.0000 mg | ORAL_TABLET | ORAL | Status: DC | PRN

## 2015-03-21 NOTE — Discharge Instructions (Signed)
Diet: As you were doing prior to hospitalization   Activity: Increase activity slowly as tolerated  No lifting or driving while taking pain medication  Shower: May shower with a dressing covering knee post op day #5, then apply clean dry dressing following, NO SOAKING in tub   Dressing: You may change your dressing on post op day #5.  Then change the dressing daily with sterile 4"x4"s gauze dressing   Weight Bearing: weight bearing as taught in physical therapy. Use a walker or  Crutches as instructed.   To prevent constipation: you may use a stool softener such as -  Colace ( over the counter) 100 mg by mouth twice a day  Drink plenty of fluids ( prune juice may be helpful) and high fiber foods  Miralax ( over the counter) for constipation as needed.   Precautions: If you experience chest pain or shortness of breath - call 911 immediately For transfer to the hospital emergency department!!  If you develop a fever greater that 101 F, purulent drainage from wound, increased redness or drainage from wound, or calf pain -- Call the office   Follow- Up Appointment: Please call for an appointment to be seen in 1 week  [place] - [phone]  ID Note: Assessment/Plan:  INTERVAL HISTORY: 03/20/15: Cultures from Orthopedic office + for OX sensitive (MSSA)   Active Problems:  Wound infection after surgery  Staphylococcal arthritis of left knee ([place])  Staphylococcus aureus infection  Smoker  Screen for STD (sexually transmitted disease)    Adam Tyler is a 60 y.o. male with Postoperative septic knee with MS Staphylococcus aureus  #1 Septic knee with methicillin sensitive Staphylococcus aureus growing from cultures from the office.  Deep cultures still pending  Narrow to Cefazolin 2g IV q 8 hours  Make sure the deep operative cultures growing the same organism  Diagnosis: Septic arthritis Culture Result: Methicillin sensitive staph aureus from office  culture  Allergies  Allergen Reactions   Adhesive [Tape] Other (See Comments)    Leg raw and red, burning from adhesive bandage 03/18/15   Strawberry Hives and Nausea And Vomiting    Discharge antibiotics: Cefazolin 2g IV q 8 hours  Duration: 6 weeks  End Date: Nov 11th.   [facility] Care Per Protocol: Labs weekly while on IV antibiotics: _x_ CBC with differential _x_ BMP _x_ CRP _x_ ESR   Fax weekly labs to [phone]  Clinic Follow Up Appt: next 4 weeks  #2 Elevated LFTs: likely from etoh: -will add hepatitis B and a testing hep C pending  #3 screening hep C and HIV are pending  #4 HTN: Now that we dont appear to have to use vancomycin, would be fine with him going back on his home bp meds at descretion of primary team since we will not need to worry so much about antibiotic related nephrotoxicity  I will arrange follow-up in our clinic in the next 4 weeks

## 2015-03-21 NOTE — Care Management Note (Addendum)
Case Management Note  Patient Details  Name: Adam Tyler MRN: [id number] Date of Birth: 12/17/1955  Subjective/Objective:          Admitted with left knee infection          Action/Plan: Spoke with patient about home IV antibiotics. Patient agreeable to learning to give IV antibiotics and stated that his fiancee will also be able to learn. He selected [facility]. Contacted Pam with [facility] and set up [facility] and IV antibiotics. PICC to be placed today and anticipate d/c today. Patient states that he ambulates well with crutches, no equipment needs identified. Will continue to follow.    Expected Discharge Date:                  Expected Discharge Plan:  [place]  In-House Referral:  NA  Discharge planning Services  CM Consult  Post Acute Care Choice:  Home Health Choice offered to:  Patient  DME Arranged:    DME Agency:     HH Arranged:  RN, IV Antibiotics HH Agency:  [place]  Status of Service:  In process, will continue to follow  Medicare Important Message Given:    Date Medicare IM Given:    Medicare IM give by:    Date Additional Medicare IM Given:    Additional Medicare Important Message give by:     If discussed at [place] of Stay Meetings, dates discussed:    Additional Comments:  Spoke with Pam at [facility], patient has received initial instructions for home IV antibiotics, [facility] will be out to patient's home this evening to teach and assist with evening dose.   Nila Nephew, RN 03/21/2015, 10:12 AM

## 2015-03-21 NOTE — Discharge Summary (Signed)
PATIENT ID: Adam Tyler        MRN:  [id number]          DOB/AGE: 21-Jan-1956 / 59 y.o.    DISCHARGE SUMMARY  ADMISSION DATE:    03/18/2015 DISCHARGE DATE:   03/21/2015   ADMISSION DIAGNOSIS: infected post op wound left knee INFECTION    DISCHARGE DIAGNOSIS:  INFECTION    ADDITIONAL DIAGNOSIS: Active Problems:   Wound infection after surgery   Staphylococcal arthritis of left knee (HCC)   Staphylococcus aureus infection   Smoker   Screen for STD (sexually transmitted disease)   Heavy alcohol use   Elevated liver function tests  Past Medical History  Diagnosis Date  . GERD (gastroesophageal reflux disease)   . Arthritis   . Hypertension   . Upper respiratory infection 05/17/13    no fever since 05/18/13  . Cancer ([place]) 2007    melanoma forehead   . Anxiety     h/o panic attacks from " stress"  . Sciatica of left side     per pt  "buldging disc- lumbar"  . Sleep apnea     uses CPAP  . Acute medial meniscal injury of knee     left  . Arthritis, septic, knee ([place])     PROCEDURE: Procedure(s): ARTHROSCOPIC, LAVAGE, SYNOVECTOMY LEFT KNEE  Left on 03/18/2015  CONSULTS: ID      HISTORY:  See H&P in chart  HOSPITAL COURSE:  JEMELL TOWN is a 59 y.o. admitted on 03/18/2015 and found to have a diagnosis of INFECTION.  After appropriate laboratory studies were obtained  they were taken to the operating room on 03/18/2015 and underwent  Procedure(s): ARTHROSCOPIC, LAVAGE, SYNOVECTOMY LEFT KNEE   Left.   They were given perioperative antibiotics:  Anti-infectives    Start     Dose/Rate Route Frequency Ordered Stop   03/21/15 0000  ceFAZolin (ANCEF) 2-3 GM-% SOLR    Comments:  Dispense quantity sufficient as ordered with end date 04/29/15   2 g 100 mL/hr over 30 Minutes Intravenous Every 8 hours 03/21/15 0902 04/29/15 2359   03/19/15 0600  ceFAZolin (ANCEF) IVPB 2 g/50 mL premix  Status:  Discontinued     2 g 100 mL/hr over 30 Minutes Intravenous To ShortStay Surgical  03/18/15 1248 03/18/15 2157   03/19/15 0400  vancomycin (VANCOCIN) IVPB 1000 mg/200 mL premix  Status:  Discontinued    Comments:  Dose adjusted based on weight/renal function   1,000 mg 200 mL/hr over 60 Minutes Intravenous 3 times per day 03/18/15 2205 03/20/15 1303   03/18/15 2300  ceFAZolin (ANCEF) IVPB 2 g/50 mL premix     2 g 100 mL/hr over 30 Minutes Intravenous 3 times per day 03/18/15 2205 03/22/15 2159    .  Tolerated the procedure well.       Toradol was given post op.  POD #1, allowed out of bed to a chair.  O2 discontionued.  Admistering IV antibiotics, Consult ID, awaiting cultures.  POD #2,  Hemovac pulled.  Culture with MSSA, recommendations given for home treatment . POD#3 placed PICC line, d/c home with [facility] The remainder of the hospital course was dedicated to ambulation and strengthening.   The patient was discharged on 3 Days Post-Op in  Stable condition.  Blood products given:none  DIAGNOSTIC STUDIES: Recent vital signs: Patient Vitals for the past 24 hrs:  BP Temp Temp src Pulse Resp SpO2  03/21/15 0513 135/79 mmHg 98.2 F (36.8 C) Oral  83 - 95 %  03/20/15 2109 132/75 mmHg 99.3 F (37.4 C) Oral 94 16 96 %  03/20/15 1300 138/73 mmHg 98.3 F (36.8 C) Oral 83 18 97 %       Recent laboratory studies:  Recent Labs  03/19/15 1530 03/20/15 1127  WBC 9.8 11.0*  HGB 14.0 13.6  HCT 42.0 40.6  PLT 204 197    Recent Labs  03/19/15 0735 03/20/15 0623 03/21/15 0713  NA 136 134* 134*  K 3.8 3.9 4.2  CL 99* 100* 98*  CO2 30 27 28   BUN 9 11 12   CREATININE 0.87 0.81 0.79  GLUCOSE 140* 151* 133*  CALCIUM 8.6* 8.4* 8.9   No results found for: INR, PROTIME   Recent Radiographic Studies :  No results found.  DISCHARGE INSTRUCTIONS:   DISCHARGE MEDICATIONS:     Medication List    STOP taking these medications        ARTHRITIS STRENGTH BC POWDER PO     HYDROcodone-acetaminophen 7.5-325 MG tablet  Commonly known as:  NORCO      oxyCODONE-acetaminophen 5-325 MG tablet  Commonly known as:  ROXICET      TAKE these medications        aspirin EC 325 MG tablet  Take 1 tablet (325 mg total) by mouth 2 (two) times daily.     ceFAZolin 2-3 GM-% Solr  Commonly known as:  ANCEF  Inject 50 mLs (2 g total) into the vein every 8 (eight) hours.     losartan-hydrochlorothiazide 100-12.5 MG tablet  Commonly known as:  HYZAAR  Take 1 tablet by mouth daily.     oxyCODONE 5 MG immediate release tablet  Commonly known as:  ROXICODONE  Take 1 tablet (5 mg total) by mouth every 4 (four) hours as needed for severe pain.     venlafaxine XR 75 MG 24 hr capsule  Commonly known as:  EFFEXOR-XR  Take 75 mg by mouth daily after supper.        FOLLOW UP VISIT:       Follow-up Information    Follow up with Alcide Evener, MD. Schedule an appointment as soon as possible for a visit in 4 weeks.   Specialty:  Infectious Diseases   Contact information:   [address] [place] [address] [phone]       Follow up with CAFFREY JR,W D, MD. Schedule an appointment as soon as possible for a visit in 1 week.   Specialty:  Orthopedic Surgery   Contact information:   [place] [address] [phone]       DISPOSITION:   Home  CONDITION:  Stable   Chriss Czar, PA-C  03/21/2015 9:44 AM

## 2015-03-21 NOTE — Progress Notes (Signed)
Peripherally Inserted Central Catheter/Midline Placement  The IV Nurse has discussed with the patient and/or persons authorized to consent for the patient, the purpose of this procedure and the potential benefits and risks involved with this procedure.  The benefits include less needle sticks, lab draws from the catheter and patient may be discharged home with the catheter.  Risks include, but not limited to, infection, bleeding, blood clot (thrombus formation), and puncture of an artery; nerve damage and irregular heat beat.  Alternatives to this procedure were also discussed.  PICC/Midline Placement Documentation  PICC / Midline Single Lumen 77/11/65 PICC Right Basilic 42 cm 0 cm (Active)  Indication for Insertion or Continuance of Line Home intravenous therapies (PICC only) 03/21/2015 12:00 PM  Exposed Catheter (cm) 0 cm 03/21/2015 12:00 PM  Dressing Change Due 03/28/15 03/21/2015 12:00 PM       Holley Bouche Renee 03/21/2015, 12:42 PM

## 2015-03-22 ENCOUNTER — Ambulatory Visit: Payer: Self-pay | Admitting: Physician Assistant

## 2015-03-22 ENCOUNTER — Encounter (HOSPITAL_BASED_OUTPATIENT_CLINIC_OR_DEPARTMENT_OTHER): Payer: Self-pay | Admitting: *Deleted

## 2015-03-22 LAB — BODY FLUID CULTURE

## 2015-03-22 LAB — HEPATITIS B SURFACE ANTIBODY, QUANTITATIVE: Hepatitis B-Post: <3.1 m[IU]/mL — ABNORMAL LOW (ref >9.9)

## 2015-03-22 LAB — HEPATITIS A ANTIBODY, TOTAL: Hep A Total Ab: NEGATIVE

## 2015-03-22 LAB — HEPATITIS B SURFACE ANTIGEN: HEP B S AG: NEGATIVE

## 2015-03-22 NOTE — H&P (Signed)
Adam Tyler is an 59 y.o. male.   Chief Complaint: left knee post op infection HPI: Had done a knee arthroscopy approximately 2 weeks ago, pt presented to the office last Friday with continued drainage purulent.  He was direct admitted to the hospital last Friday, taken to OR for arthroscopy lavage and debridement, inpatient over the weekend with ID consulted.  Cultures growing out staph aureus, per ID recs PICC placed and taking 2g Ancef q8hrs.  Was d/c home yesterday from the hospital has had increased swelling and pain since comes to outpatient office today.  Past Medical History  Diagnosis Date  . GERD (gastroesophageal reflux disease)   . Arthritis   . Hypertension   . Upper respiratory infection 05/17/13    no fever since 05/18/13  . Cancer ([place]) 2007    melanoma forehead   . Anxiety     h/o panic attacks from " stress"  . Sciatica of left side     per pt  "buldging disc- lumbar"  . Sleep apnea     uses CPAP  . Acute medial meniscal injury of knee     left  . Arthritis, septic, knee [facility])     Past Surgical History  Procedure Laterality Date  . Hernia repair    . Tonsillectomy    . Hand surgery    . Skin graft Right   . Mass excision Right 06/19/2013    Procedure: EXCISION MASS RIGHT POSTERIOR NECK;  Surgeon: Earnstine Regal, MD;  Location: [facility];  Service: General;  Laterality: Right;  . Knee arthroscopy with medial menisectomy Left 03/09/2015    Procedure: KNEE ARTHROSCOPY WITH  PARTIAL MEDIAL MENISECTOMY;  Surgeon: Earlie Server, MD;  Location: [place];  Service: Orthopedics;  Laterality: Left;  . Chondroplasty Left 03/09/2015    Procedure: CHONDROPLASTY;  Surgeon: Earlie Server, MD;  Location: [place];  Service: Orthopedics;  Laterality: Left;  . Knee arthroscopy Left 03/18/2015    Procedure: ARTHROSCOPIC, LAVAGE, SYNOVECTOMY LEFT KNEE ;  Surgeon: Earlie Server, MD;  Location: [place];  Service: Orthopedics;  Laterality: Left;    Family  History  Problem Relation Age of Onset  . Cancer Mother     lymphnode  . Stroke Father    Social History:  reports that he has been smoking Cigarettes.  He has a 10 pack-year smoking history. He has never used smokeless tobacco. He reports that he drinks alcohol. He reports that he does not use illicit drugs.  Allergies:  Allergies  Allergen Reactions  . Adhesive [Tape] Other (See Comments)    Leg raw and red, burning from adhesive bandage 03/18/15  . Strawberry Hives and Nausea And Vomiting     (Not in a hospital admission)  Results for orders placed or performed during the hospital encounter of 03/18/15 (from the past 48 hour(s))  Comprehensive metabolic panel     Status: Abnormal   Collection Time: 03/21/15  7:13 AM  Result Value Ref Range   Sodium 134 (L) 135 - 145 mmol/L   Potassium 4.2 3.5 - 5.1 mmol/L   Chloride 98 (L) 101 - 111 mmol/L   CO2 28 22 - 32 mmol/L   Glucose, Bld 133 (H) 65 - 99 mg/dL   BUN 12 6 - 20 mg/dL   Creatinine, Ser 0.79 0.61 - 1.24 mg/dL   Calcium 8.9 8.9 - 10.3 mg/dL   Total Protein 6.3 (L) 6.5 - 8.1 g/dL   Albumin 3.0 (L) 3.5 -  5.0 g/dL   AST 22 15 - 41 U/L   ALT 50 17 - 63 U/L   Alkaline Phosphatase 71 38 - 126 U/L   Total Bilirubin 0.8 0.3 - 1.2 mg/dL   GFR calc non Af Amer >60 >60 mL/min   GFR calc Af Amer >60 >60 mL/min    Comment: (NOTE) The eGFR has been calculated using the CKD EPI equation. This calculation has not been validated in all clinical situations. eGFR's persistently <60 mL/min signify possible Chronic Kidney Disease.    Anion gap 8 5 - 15  Hepatitis B surface antigen     Status: None   Collection Time: 03/21/15  7:13 AM  Result Value Ref Range   Hepatitis B Surface Ag Negative Negative    Comment: (NOTE) Performed At: [facility] [place], [place] [id number] Lindon Romp MD [id number]   Hepatitis B surface antibody     Status: Abnormal   Collection Time: 03/21/15  7:13 AM  Result  Value Ref Range   Hepatitis B-Post <3.1 (L) Immunity>9.9 mIU/mL    Comment: (NOTE)  Status of Immunity                     Anti-HBs Level  ------------------                     -------------- Inconsistent with Immunity                   0.0 - 9.9 Consistent with Immunity                          >9.9 Performed At: [facility] [place], [place] [id number] Lindon Romp MD [id number]   Hepatitis A antibody, total     Status: None   Collection Time: 03/21/15  7:13 AM  Result Value Ref Range   Hep A Total Ab Negative Negative    Comment: (NOTE) Performed At: [facility] [place], [place] [id number] Lindon Romp MD [id number]   CBC with Differential     Status: Abnormal   Collection Time: 03/21/15  7:13 AM  Result Value Ref Range   WBC 12.1 (H) 4.0 - 10.5 K/uL   RBC 4.41 4.22 - 5.81 MIL/uL   Hemoglobin 13.9 13.0 - 17.0 g/dL   HCT 41.9 39.0 - 52.0 %   MCV 95.0 78.0 - 100.0 fL   MCH 31.5 26.0 - 34.0 pg   MCHC 33.2 30.0 - 36.0 g/dL   RDW 13.6 11.5 - 15.5 %   Platelets 228 150 - 400 K/uL   Neutrophils Relative % 72 %   Neutro Abs 8.6 (H) 1.7 - 7.7 K/uL   Lymphocytes Relative 13 %   Lymphs Abs 1.6 0.7 - 4.0 K/uL   Monocytes Relative 14 %   Monocytes Absolute 1.7 (H) 0.1 - 1.0 K/uL   Eosinophils Relative 1 %   Eosinophils Absolute 0.1 0.0 - 0.7 K/uL   Basophils Relative 0 %   Basophils Absolute 0.0 0.0 - 0.1 K/uL  Sed Rate (ESR)     Status: Abnormal   Collection Time: 03/21/15 10:17 AM  Result Value Ref Range   Sed Rate 67 (H) 0 - 16 mm/hr  C-reactive protein     Status: Abnormal   Collection Time: 03/21/15 10:17 AM  Result Value Ref Range   CRP 16.3 (H) <1.0 mg/dL  Basic Metabolic  Panel (BMET)     Status: Abnormal   Collection Time: 03/21/15 10:17 AM  Result Value Ref Range   Sodium 136 135 - 145 mmol/L   Potassium 4.3 3.5 - 5.1 mmol/L   Chloride 99 (L) 101 - 111 mmol/L   CO2 30 22 - 32 mmol/L   Glucose, Bld  160 (H) 65 - 99 mg/dL   BUN 14 6 - 20 mg/dL   Creatinine, Ser 0.89 0.61 - 1.24 mg/dL   Calcium 9.1 8.9 - 10.3 mg/dL   GFR calc non Af Amer >60 >60 mL/min   GFR calc Af Amer >60 >60 mL/min    Comment: (NOTE) The eGFR has been calculated using the CKD EPI equation. This calculation has not been validated in all clinical situations. eGFR's persistently <60 mL/min signify possible Chronic Kidney Disease.    Anion gap 7 5 - 15   No results found.  Review of Systems  Constitutional: Positive for fever and chills.  HENT: Negative.   Eyes: Negative.   Respiratory: Negative.   Cardiovascular: Negative.   Gastrointestinal: Negative.   Genitourinary: Negative.   Musculoskeletal: Positive for joint pain. Negative for falls.  Skin: Negative.   Neurological: Negative.   Endo/Heme/Allergies: Negative.   Psychiatric/Behavioral: Negative.     There were no vitals taken for this visit. Physical Exam  Constitutional: He is oriented to person, place, and time. He appears well-developed and well-nourished. No distress.  HENT:  Head: Normocephalic and atraumatic.  Nose: Nose normal.  Eyes: Conjunctivae and EOM are normal. Pupils are equal, round, and reactive to light.  Neck: Normal range of motion. Neck supple.  Cardiovascular: Normal rate and intact distal pulses.   Respiratory: Effort normal. No respiratory distress. He has no wheezes.  GI: Soft. He exhibits no distension. There is no tenderness.  Musculoskeletal:       Left knee: He exhibits decreased range of motion, swelling and effusion. Tenderness found.  Neurological: He is alert and oriented to person, place, and time. No cranial nerve deficit.  Skin: Skin is warm and dry. No rash noted. No erythema.  Psychiatric: He has a normal mood and affect. His behavior is normal.     Assessment/Plan Left knee post op infection  With recurrent effusion and pain know infected left knee recommend repeat arthroscopy and lavage.  Patient  agrees will be done outpatient will continue with Ancef as current.    Chriss Czar 03/22/2015, 2:05 PM

## 2015-03-23 ENCOUNTER — Encounter (HOSPITAL_BASED_OUTPATIENT_CLINIC_OR_DEPARTMENT_OTHER)
Admission: RE | Disposition: A | Discharge status: Home / Self Care | Payer: Self-pay | Source: Ambulatory Visit | Attending: Orthopedic Surgery

## 2015-03-23 ENCOUNTER — Encounter (HOSPITAL_BASED_OUTPATIENT_CLINIC_OR_DEPARTMENT_OTHER): Payer: Self-pay | Admitting: *Deleted

## 2015-03-23 ENCOUNTER — Ambulatory Visit (HOSPITAL_BASED_OUTPATIENT_CLINIC_OR_DEPARTMENT_OTHER)
Admission: RE | Admit: 2015-03-23 | Discharge: 2015-03-23 | Disposition: A | Discharge status: Home / Self Care | Payer: Commercial Managed Care - HMO | Source: Ambulatory Visit | Attending: Orthopedic Surgery | Admitting: Orthopedic Surgery

## 2015-03-23 ENCOUNTER — Ambulatory Visit (HOSPITAL_BASED_OUTPATIENT_CLINIC_OR_DEPARTMENT_OTHER): Payer: Commercial Managed Care - HMO | Admitting: Anesthesiology

## 2015-03-23 DIAGNOSIS — I10 Essential (primary) hypertension: Secondary | ICD-10-CM | POA: Diagnosis not present

## 2015-03-23 DIAGNOSIS — M009 Pyogenic arthritis, unspecified: Secondary | ICD-10-CM | POA: Diagnosis present

## 2015-03-23 HISTORY — PX: KNEE ARTHROSCOPY: SHX127

## 2015-03-23 SURGERY — ARTHROSCOPY, KNEE
Anesthesia: General | Site: Knee | Laterality: Left

## 2015-03-23 MED ORDER — OXYCODONE HCL 5 MG/5ML PO SOLN: 5.0000 mg | Freq: Once | ORAL | Status: DC | PRN

## 2015-03-23 MED ORDER — FENTANYL CITRATE (PF) 100 MCG/2ML IJ SOLN
INTRAMUSCULAR | Status: AC
  Filled 2015-03-23: qty 4

## 2015-03-23 MED ORDER — SUCCINYLCHOLINE CHLORIDE 20 MG/ML IJ SOLN
INTRAMUSCULAR | Status: AC
  Filled 2015-03-23: qty 1

## 2015-03-23 MED ORDER — OXYCODONE HCL 5 MG PO TABS: 5.0000 mg | ORAL_TABLET | Freq: Once | ORAL | Status: DC | PRN

## 2015-03-23 MED ORDER — PROPOFOL 10 MG/ML IV BOLUS
INTRAVENOUS | Status: AC
  Filled 2015-03-23: qty 20

## 2015-03-23 MED ORDER — LIDOCAINE HCL (CARDIAC) 20 MG/ML IV SOLN
INTRAVENOUS | Status: AC
  Filled 2015-03-23: qty 5

## 2015-03-23 MED ORDER — GLYCOPYRROLATE 0.2 MG/ML IJ SOLN
INTRAMUSCULAR | Status: AC
  Filled 2015-03-23: qty 1

## 2015-03-23 MED ORDER — CEFAZOLIN SODIUM-DEXTROSE 2-3 GM-% IV SOLR
INTRAVENOUS | Status: AC
  Filled 2015-03-23: qty 50

## 2015-03-23 MED ORDER — HEPARIN SOD (PORK) LOCK FLUSH 100 UNIT/ML IV SOLN
250.0000 [IU] | INTRAVENOUS | Status: AC | PRN
  Administered 2015-03-23: 250 [IU]

## 2015-03-23 MED ORDER — FENTANYL CITRATE (PF) 100 MCG/2ML IJ SOLN
50.0000 ug | INTRAMUSCULAR | Status: AC | PRN
  Administered 2015-03-23: 50 ug via INTRAVENOUS
  Administered 2015-03-23: 100 ug via INTRAVENOUS
  Administered 2015-03-23: 50 ug via INTRAVENOUS

## 2015-03-23 MED ORDER — KETOROLAC TROMETHAMINE 30 MG/ML IJ SOLN
INTRAMUSCULAR | Status: AC
Start: 2015-03-23 — End: 2015-03-23
  Filled 2015-03-23: qty 1

## 2015-03-23 MED ORDER — SODIUM CHLORIDE 0.9 % IR SOLN
Status: DC | PRN
  Administered 2015-03-23: 12000 mL

## 2015-03-23 MED ORDER — DEXAMETHASONE SODIUM PHOSPHATE 4 MG/ML IJ SOLN
INTRAMUSCULAR | Status: DC | PRN
  Administered 2015-03-23: 10 mg via INTRAVENOUS

## 2015-03-23 MED ORDER — EPINEPHRINE HCL 1 MG/ML IJ SOLN
INTRAMUSCULAR | Status: DC | PRN
  Administered 2015-03-23: 1 mg

## 2015-03-23 MED ORDER — GLYCOPYRROLATE 0.2 MG/ML IJ SOLN: 0.2000 mg | Freq: Once | INTRAMUSCULAR | Status: DC | PRN

## 2015-03-23 MED ORDER — LIDOCAINE HCL (CARDIAC) 20 MG/ML IV SOLN
INTRAVENOUS | Status: DC | PRN
  Administered 2015-03-23: 80 mg via INTRAVENOUS

## 2015-03-23 MED ORDER — CEFAZOLIN SODIUM-DEXTROSE 2-3 GM-% IV SOLR: 2.0000 g | INTRAVENOUS | Status: DC

## 2015-03-23 MED ORDER — HYDROMORPHONE HCL 1 MG/ML IJ SOLN
0.2500 mg | INTRAMUSCULAR | Status: DC | PRN
  Administered 2015-03-23 (×2): 0.5 mg via INTRAVENOUS

## 2015-03-23 MED ORDER — MIDAZOLAM HCL 2 MG/2ML IJ SOLN
1.0000 mg | INTRAMUSCULAR | Status: DC | PRN
  Administered 2015-03-23: 2 mg via INTRAVENOUS

## 2015-03-23 MED ORDER — EPINEPHRINE HCL 1 MG/ML IJ SOLN
INTRAMUSCULAR | Status: AC
  Filled 2015-03-23: qty 1

## 2015-03-23 MED ORDER — MEPERIDINE HCL 25 MG/ML IJ SOLN: 6.2500 mg | INTRAMUSCULAR | Status: DC | PRN

## 2015-03-23 MED ORDER — ONDANSETRON HCL 4 MG/2ML IJ SOLN
INTRAMUSCULAR | Status: AC
  Filled 2015-03-23: qty 2

## 2015-03-23 MED ORDER — DEXAMETHASONE SODIUM PHOSPHATE 10 MG/ML IJ SOLN
INTRAMUSCULAR | Status: AC
  Filled 2015-03-23: qty 1

## 2015-03-23 MED ORDER — PROPOFOL 10 MG/ML IV BOLUS
INTRAVENOUS | Status: DC | PRN
  Administered 2015-03-23: 200 mg via INTRAVENOUS
  Administered 2015-03-23: 50 mg via INTRAVENOUS

## 2015-03-23 MED ORDER — MIDAZOLAM HCL 2 MG/2ML IJ SOLN
INTRAMUSCULAR | Status: AC
  Filled 2015-03-23: qty 4

## 2015-03-23 MED ORDER — CHLORHEXIDINE GLUCONATE 4 % EX LIQD: 60.0000 mL | Freq: Once | CUTANEOUS | Status: DC

## 2015-03-23 MED ORDER — KETOROLAC TROMETHAMINE 30 MG/ML IJ SOLN
INTRAMUSCULAR | Status: DC | PRN
  Administered 2015-03-23: 30 mg via INTRAVENOUS

## 2015-03-23 MED ORDER — BUPIVACAINE-EPINEPHRINE (PF) 0.5% -1:200000 IJ SOLN
INTRAMUSCULAR | Status: AC
  Filled 2015-03-23: qty 30

## 2015-03-23 MED ORDER — SODIUM CHLORIDE 0.9 % IV SOLN: INTRAVENOUS | Status: DC

## 2015-03-23 MED ORDER — SCOPOLAMINE 1 MG/3DAYS TD PT72: 1.0000 | MEDICATED_PATCH | Freq: Once | TRANSDERMAL | Status: DC | PRN

## 2015-03-23 MED ORDER — LACTATED RINGERS IV SOLN
INTRAVENOUS | Status: DC
  Administered 2015-03-23 (×2): via INTRAVENOUS

## 2015-03-23 MED ORDER — HYDROMORPHONE HCL 1 MG/ML IJ SOLN
INTRAMUSCULAR | Status: AC
  Filled 2015-03-23: qty 1

## 2015-03-23 SURGICAL SUPPLY — 44 items
BANDAGE ELASTIC 6 VELCRO ST LF (GAUZE/BANDAGES/DRESSINGS) IMPLANT
BANDAGE ESMARK 6X9 LF (GAUZE/BANDAGES/DRESSINGS) IMPLANT
BLADE 4.2CUDA (BLADE) ×1 IMPLANT
BLADE CUDA 5.5 (BLADE) IMPLANT
BLADE CUDA GRT WHITE 3.5 (BLADE) IMPLANT
BLADE CUDA SHAVER 3.5 (BLADE) IMPLANT
BLADE CUTTER MENIS 5.5 (BLADE) IMPLANT
BLADE GREAT WHITE 4.2 (BLADE) IMPLANT
BNDG CMPR 9X6 STRL LF SNTH (GAUZE/BANDAGES/DRESSINGS)
BNDG ESMARK 6X9 LF (GAUZE/BANDAGES/DRESSINGS)
BNDG GAUZE ELAST 4 BULKY (GAUZE/BANDAGES/DRESSINGS) ×2 IMPLANT
BRUSH SCRUB EZ PLAIN DRY (MISCELLANEOUS) ×2 IMPLANT
CUTTER MENISCUS  4.2MM (BLADE)
CUTTER MENISCUS 4.2MM (BLADE) IMPLANT
DRAPE ARTHROSCOPY W/POUCH 114 (DRAPES) ×2 IMPLANT
DRSG EMULSION OIL 3X3 NADH (GAUZE/BANDAGES/DRESSINGS) ×2 IMPLANT
DURAPREP 26ML APPLICATOR (WOUND CARE) ×2 IMPLANT
GAUZE SPONGE 4X4 12PLY STRL (GAUZE/BANDAGES/DRESSINGS) ×2 IMPLANT
GLOVE BIO SURGEON STRL SZ 6.5 (GLOVE) ×1 IMPLANT
GLOVE BIOGEL PI IND STRL 7.0 (GLOVE) IMPLANT
GLOVE BIOGEL PI IND STRL 8 (GLOVE) ×2 IMPLANT
GLOVE BIOGEL PI INDICATOR 7.0 (GLOVE) ×1
GLOVE BIOGEL PI INDICATOR 8 (GLOVE) ×2
GLOVE SURG ORTHO 8.0 STRL STRW (GLOVE) ×2 IMPLANT
GOWN STRL REUS W/ TWL LRG LVL3 (GOWN DISPOSABLE) ×1 IMPLANT
GOWN STRL REUS W/ TWL XL LVL3 (GOWN DISPOSABLE) ×1 IMPLANT
GOWN STRL REUS W/TWL LRG LVL3 (GOWN DISPOSABLE) ×4
GOWN STRL REUS W/TWL XL LVL3 (GOWN DISPOSABLE) ×2
HOLDER KNEE FOAM BLUE (MISCELLANEOUS) ×2 IMPLANT
KNEE WRAP E Z 3 GEL PACK (MISCELLANEOUS) ×1 IMPLANT
MANIFOLD NEPTUNE II (INSTRUMENTS) ×1 IMPLANT
NDL SAFETY ECLIPSE 18X1.5 (NEEDLE) ×1 IMPLANT
NEEDLE HYPO 18GX1.5 SHARP (NEEDLE) ×2
PACK ARTHROSCOPY DSU (CUSTOM PROCEDURE TRAY) ×2 IMPLANT
PACK BASIN DAY SURGERY FS (CUSTOM PROCEDURE TRAY) ×2 IMPLANT
SET ARTHROSCOPY TUBING (MISCELLANEOUS) ×2
SET ARTHROSCOPY TUBING LN (MISCELLANEOUS) ×1 IMPLANT
SUT ETHILON 4 0 PS 2 18 (SUTURE) ×2 IMPLANT
SYR 5ML LL (SYRINGE) ×2 IMPLANT
TOWEL OR 17X24 6PK STRL BLUE (TOWEL DISPOSABLE) ×2 IMPLANT
WAND 3.0 CAPSURE 30 DEG W/CORD (SURGICAL WAND) IMPLANT
WAND 30 DEG SABER W/CORD (SURGICAL WAND) IMPLANT
WAND STAR VAC 90 (SURGICAL WAND) IMPLANT
WATER STERILE IRR 1000ML POUR (IV SOLUTION) ×2 IMPLANT

## 2015-03-23 NOTE — Anesthesia Postprocedure Evaluation (Signed)
  Anesthesia Post-op Note  Patient: Adam Tyler  Procedure(s) Performed: Procedure(s): LEFT KNEE I&D WITH LAVAGE (Left)  Patient Location: PACU  Anesthesia Type: General   Level of Consciousness: awake, alert  and oriented  Airway and Oxygen Therapy: Patient Spontanous Breathing  Post-op Pain: mild  Post-op Assessment: Post-op Vital signs reviewed  Post-op Vital Signs: Reviewed  Last Vitals:  Filed Vitals:   03/23/15 1651  BP: 148/84  Pulse: 82  Temp: 37 C  Resp: 16    Complications: No apparent anesthesia complications

## 2015-03-23 NOTE — Brief Op Note (Signed)
03/23/2015  4:05 PM  PATIENT:  Deitra Mayo  59 y.o. male  PRE-OPERATIVE DIAGNOSIS:  post op infection  POST-OPERATIVE DIAGNOSIS:  post op infection  PROCEDURE:  Procedure(s): LEFT KNEE I&D WITH LAVAGE (Left)  SURGEON:  Surgeon(s) and Role:    * Earlie Server, MD - Primary  PHYSICIAN ASSISTANT:   ASSISTANTS:   ANESTHESIA:   general  EBL:  Total I/O In: 1100 [I.V.:1100] Out: -   BLOOD ADMINISTERED:none  DRAINS: none   LOCAL MEDICATIONS USED:  NONE  SPECIMEN:  No Specimen  DISPOSITION OF SPECIMEN:  N/A  COUNTS:  YES  TOURNIQUET:  * No tourniquets in log *  DICTATION: .Other Dictation: Dictation Number unknown  PLAN OF CARE: Discharge to home after PACU  PATIENT DISPOSITION:  PACU - hemodynamically stable.   Delay start of Pharmacological VTE agent (>24hrs) due to surgical blood loss or risk of bleeding: not applicable

## 2015-03-23 NOTE — Transfer of Care (Signed)
Immediate Anesthesia Transfer of Care Note  Patient: Adam Tyler  Procedure(s) Performed: Procedure(s): LEFT KNEE I&D WITH LAVAGE (Left)  Patient Location: PACU  Anesthesia Type:General  Level of Consciousness: awake, sedated and patient cooperative  Airway & Oxygen Therapy: Patient Spontanous Breathing and Patient connected to face mask oxygen  Post-op Assessment: Report given to RN and Post -op Vital signs reviewed and stable  Post vital signs: Reviewed and stable  Last Vitals:  Filed Vitals:   03/23/15 1315  BP: 135/73  Pulse: 92  Temp: 37.2 C  Resp: 18    Complications: No apparent anesthesia complications

## 2015-03-23 NOTE — Discharge Instructions (Signed)
Diet: As you were doing prior to hospitalization   Activity: Increase activity slowly as tolerated  No lifting or driving for 48 hours  Shower: May shower without a dressing on post op day #3, NO SOAKING in tub   Dressing: You may change your dressing on post op day #3.  Then change the dressing daily with sterile 4"x4"s gauze dressing   Weight Bearing: weight bearing as tolerated  To prevent constipation: you may use a stool softener such as -  Colace ( over the counter) 100 mg by mouth twice a day  Drink plenty of fluids ( prune juice may be helpful) and high fiber foods  Miralax ( over the counter) for constipation as needed.   Precautions: If you experience chest pain or shortness of breath - call 911 immediately For transfer to the hospital emergency department!!  If you develop a fever greater that 101 F, purulent drainage from wound, increased redness or drainage from wound, or calf pain -- Call the office   Follow- Up Appointment: Please call for an appointment to be seen in 1 week  [facility] - [phone] Call your surgeon if you experience:   1.  Fever over 101.0. 2.  Inability to urinate. 3.  Nausea and/or vomiting. 4.  Extreme swelling or bruising at the surgical site. 5.  Continued bleeding from the incision. 6.  Increased pain, redness or drainage from the incision. 7.  Problems related to your pain medication. 8. Any change in color, movement and/or sensation 9. Any problems and/or concerns   Post Anesthesia Home Care Instructions  Activity: Get plenty of rest for the remainder of the day. A responsible adult should stay with you for 24 hours following the procedure.  For the next 24 hours, DO NOT: -Drive a car -Paediatric nurse -Drink alcoholic beverages -Take any medication unless instructed by your physician -Make any legal decisions or sign important papers.  Meals: Start with liquid foods such as gelatin or soup. Progress to regular foods as  tolerated. Avoid greasy, spicy, heavy foods. If nausea and/or vomiting occur, drink only clear liquids until the nausea and/or vomiting subsides. Call your physician if vomiting continues.  Special Instructions/Symptoms: Your throat may feel dry or sore from the anesthesia or the breathing tube placed in your throat during surgery. If this causes discomfort, gargle with warm salt water. The discomfort should disappear within 24 hours.  If you had a scopolamine patch placed behind your ear for the management of post- operative nausea and/or vomiting:  1. The medication in the patch is effective for 72 hours, after which it should be removed.  Wrap patch in a tissue and discard in the trash. Wash hands thoroughly with soap and water. 2. You may remove the patch earlier than 72 hours if you experience unpleasant side effects which may include dry mouth, dizziness or visual disturbances. 3. Avoid touching the patch. Wash your hands with soap and water after contact with the patch.

## 2015-03-23 NOTE — Anesthesia Procedure Notes (Signed)
Procedure Name: LMA Insertion Date/Time: 03/23/2015 3:12 PM Performed by: Lyndee Leo Pre-anesthesia Checklist: Patient identified, Emergency Drugs available, Suction available and Patient being monitored Patient Re-evaluated:Patient Re-evaluated prior to inductionOxygen Delivery Method: Circle System Utilized Preoxygenation: Pre-oxygenation with 100% oxygen Intubation Type: IV induction Ventilation: Mask ventilation without difficulty LMA: LMA inserted LMA Size: 5.0 Number of attempts: 1 Airway Equipment and Method: Bite block Placement Confirmation: positive ETCO2 Tube secured with: Tape Dental Injury: Teeth and Oropharynx as per pre-operative assessment

## 2015-03-23 NOTE — Anesthesia Preprocedure Evaluation (Signed)
Anesthesia Evaluation  Patient identified by MRN, date of birth, ID band Patient awake    Reviewed: Allergy & Precautions, NPO status , Patient's Chart, lab work & pertinent test results  Airway Mallampati: I  TM Distance: >3 FB Neck ROM: Full    Dental  (+) Partial Lower   Pulmonary neg sleep apnea, Current Smoker,    breath sounds clear to auscultation       Cardiovascular hypertension, Pt. on medications  Rhythm:Regular Rate:Normal     Neuro/Psych    GI/Hepatic GERD  Medicated and Controlled,  Endo/Other    Renal/GU      Musculoskeletal   Abdominal   Peds  Hematology   Anesthesia Other Findings Pt has no formal diagnosis of sleep apnea but uses his wife's machine to prevent snoring.  Reproductive/Obstetrics                             Anesthesia Physical Anesthesia Plan  ASA: II  Anesthesia Plan: General   Post-op Pain Management:    Induction: Intravenous  Airway Management Planned: LMA  Additional Equipment:   Intra-op Plan:   Post-operative Plan: Extubation in OR  Informed Consent: I have reviewed the patients History and Physical, chart, labs and discussed the procedure including the risks, benefits and alternatives for the proposed anesthesia with the patient or authorized representative who has indicated his/her understanding and acceptance.   Dental advisory given  Plan Discussed with: CRNA, Anesthesiologist and Surgeon  Anesthesia Plan Comments:         Anesthesia Quick Evaluation

## 2015-03-24 ENCOUNTER — Encounter (HOSPITAL_BASED_OUTPATIENT_CLINIC_OR_DEPARTMENT_OTHER): Payer: Self-pay | Admitting: Orthopedic Surgery

## 2015-03-24 LAB — ANAEROBIC CULTURE

## 2015-03-24 NOTE — Op Note (Deleted)
[name], Adam Tyler NO.:  [id number]  MEDICAL RECORD NO.:  [id number]  LOCATION:  [id number]                        FACILITY:  [facility]  PHYSICIAN:  Lockie Pares, M.D.    DATE OF BIRTH:  [birth year]  DATE OF PROCEDURE: DATE OF DISCHARGE:  03/21/2015                              OPERATIVE REPORT   PREOPERATIVE DIAGNOSIS:  Recurrent pyarthrosis, right knee.  POSTOPERATIVE DIAGNOSIS:  Recurrent pyarthrosis, right knee.  OPERATION:  Arthroscopic lavage, right knee.  SURGEON:  Lockie Pares, M.D.  ASSISTANT:  Not applicable.  ANESTHESIA:  General anesthetic.  DESCRIPTION OF PROCEDURE:  He accumulated probably 15-20 seeds of seropurulent material in the knee, previously being cultured, this was removed.  We then proceeded to lavage, irrigate and debride the knee. We spent a good deal of time getting clot particular matter away from the joint.  We documented previous meniscus resection medially, some osteoarthritis along the patellofemoral joint, medial compartment.  No gross worsening of this process was noted compared to the first surgery. We then again after shaving with approximately 6000 mL, irrigated with an additional 6000 for total of 27078 mL leaving the portals opened. Bulky, lightly compressive dressing applied, taken to the recovery room in stable condition.     Lockie Pares, M.D.     WDC/MEDQ  D:  03/23/2015  T:  03/24/2015  Job:  [id number]

## 2015-03-24 NOTE — Op Note (Signed)
[name], Adam Tyler                  ACCOUNT NO.:  [id number]  MEDICAL RECORD NO.:  [id number]  LOCATION:                               FACILITY:  [facility]  PHYSICIAN:  Lockie Pares, M.D.    DATE OF BIRTH:  [birth year]  DATE OF PROCEDURE:  03/23/2015 DATE OF DISCHARGE:  03/23/2015                              OPERATIVE REPORT   PREOPERATIVE DIAGNOSIS:  Recurrent pyarthrosis, right knee.  POSTOPERATIVE DIAGNOSIS:  Recurrent pyarthrosis, right knee.  OPERATION:  Arthroscopic lavage, right knee.  SURGEON:  Lockie Pares, M.D.  ASSISTANT:  Not applicable.  ANESTHESIA:  General anesthetic.  DESCRIPTION OF PROCEDURE:  He accumulated probably 15-20 seeds of seropurulent material in the knee, previously being cultured, this was removed.  We then proceeded to lavage, irrigate and debride the knee. We spent a good deal of time getting clot particular matter away from the joint.  We documented previous meniscus resection medially, some osteoarthritis along the patellofemoral joint, medial compartment.  No gross worsening of this process was noted compared to the first surgery. We then again after shaving with approximately 6000 mL, irrigated with an additional 6000 for total of 16384 mL leaving the portals opened. Bulky, lightly compressive dressing applied, taken to the recovery room in stable condition.     Lockie Pares, M.D.     WDC/MEDQ  D:  03/23/2015  T:  03/24/2015  Job:  [id number]

## 2015-04-12 ENCOUNTER — Other Ambulatory Visit (HOSPITAL_COMMUNITY): Payer: Self-pay | Admitting: Orthopedic Surgery

## 2015-04-12 ENCOUNTER — Ambulatory Visit (HOSPITAL_COMMUNITY)
Admission: RE | Admit: 2015-04-12 | Discharge: 2015-04-12 | Disposition: A | Discharge status: Home / Self Care | Payer: Commercial Managed Care - HMO | Source: Ambulatory Visit | Attending: Cardiology | Admitting: Cardiology

## 2015-04-12 DIAGNOSIS — M79662 Pain in left lower leg: Secondary | ICD-10-CM

## 2015-04-12 DIAGNOSIS — R2242 Localized swelling, mass and lump, left lower limb: Secondary | ICD-10-CM | POA: Insufficient documentation

## 2015-04-12 DIAGNOSIS — I10 Essential (primary) hypertension: Secondary | ICD-10-CM | POA: Diagnosis not present

## 2015-04-12 DIAGNOSIS — M7989 Other specified soft tissue disorders: Principal | ICD-10-CM

## 2015-04-12 DIAGNOSIS — M79605 Pain in left leg: Secondary | ICD-10-CM | POA: Diagnosis not present

## 2015-04-18 ENCOUNTER — Encounter (HOSPITAL_COMMUNITY): Payer: Self-pay | Admitting: Physical Medicine and Rehabilitation

## 2015-04-18 ENCOUNTER — Emergency Department (HOSPITAL_COMMUNITY)
Admission: EM | Admit: 2015-04-18 | Discharge: 2015-04-18 | Disposition: A | Discharge status: Home / Self Care | Payer: Commercial Managed Care - HMO | Attending: Emergency Medicine | Admitting: Emergency Medicine

## 2015-04-18 ENCOUNTER — Emergency Department (HOSPITAL_COMMUNITY): Payer: Commercial Managed Care - HMO

## 2015-04-18 DIAGNOSIS — T82838A Hemorrhage of vascular prosthetic devices, implants and grafts, initial encounter: Secondary | ICD-10-CM | POA: Diagnosis present

## 2015-04-18 DIAGNOSIS — Z8709 Personal history of other diseases of the respiratory system: Secondary | ICD-10-CM | POA: Diagnosis not present

## 2015-04-18 DIAGNOSIS — Y712 Prosthetic and other implants, materials and accessory cardiovascular devices associated with adverse incidents: Secondary | ICD-10-CM | POA: Insufficient documentation

## 2015-04-18 DIAGNOSIS — Z85820 Personal history of malignant melanoma of skin: Secondary | ICD-10-CM | POA: Insufficient documentation

## 2015-04-18 DIAGNOSIS — Z7982 Long term (current) use of aspirin: Secondary | ICD-10-CM | POA: Diagnosis not present

## 2015-04-18 DIAGNOSIS — I10 Essential (primary) hypertension: Secondary | ICD-10-CM | POA: Insufficient documentation

## 2015-04-18 DIAGNOSIS — Z9981 Dependence on supplemental oxygen: Secondary | ICD-10-CM | POA: Insufficient documentation

## 2015-04-18 DIAGNOSIS — G4730 Sleep apnea, unspecified: Secondary | ICD-10-CM | POA: Diagnosis not present

## 2015-04-18 DIAGNOSIS — Z720 Tobacco use: Secondary | ICD-10-CM | POA: Diagnosis not present

## 2015-04-18 DIAGNOSIS — F410 Panic disorder [episodic paroxysmal anxiety] without agoraphobia: Secondary | ICD-10-CM | POA: Diagnosis not present

## 2015-04-18 DIAGNOSIS — Y831 Surgical operation with implant of artificial internal device as the cause of abnormal reaction of the patient, or of later complication, without mention of misadventure at the time of the procedure: Secondary | ICD-10-CM | POA: Insufficient documentation

## 2015-04-18 DIAGNOSIS — M1990 Unspecified osteoarthritis, unspecified site: Secondary | ICD-10-CM | POA: Insufficient documentation

## 2015-04-18 DIAGNOSIS — Z79899 Other long term (current) drug therapy: Secondary | ICD-10-CM | POA: Insufficient documentation

## 2015-04-18 MED ORDER — HEPARIN SOD (PORK) LOCK FLUSH 100 UNIT/ML IV SOLN
INTRAVENOUS | Status: AC
  Filled 2015-04-18: qty 5

## 2015-04-18 MED ORDER — LIDOCAINE HCL 1 % IJ SOLN
INTRAMUSCULAR | Status: AC
  Filled 2015-04-18: qty 20

## 2015-04-18 MED ORDER — CHLORHEXIDINE GLUCONATE 4 % EX LIQD
CUTANEOUS | Status: AC
  Filled 2015-04-18: qty 15

## 2015-04-18 NOTE — ED Notes (Signed)
Pt returned from IR.  Sabra Heck MD made aware

## 2015-04-18 NOTE — ED Notes (Signed)
Pt presents to department for evaluation of bleeding from PICC line. Reports he had PICC line placed several weeks ago to receive antibiotics at home. R upper arm PICC site bleeding upon arrival to triage. Pt is alert and oriented x4.

## 2015-04-18 NOTE — ED Provider Notes (Signed)
CSN: [id number]     Arrival date & time 04/18/15  1116 History   First MD Initiated Contact with Patient 04/18/15 1253     Chief Complaint  Patient presents with  . Vascular Access Problem     (Consider location/radiation/quality/duration/timing/severity/associated sxs/prior Treatment) HPI Comments: The patient is a 59 year old male, he is currently getting IV antibiotics through a peripherally inserted central catheter in his right upper extremity. A home health nurse noted today that there was a crack in the tubing and he was sent to the emergency department for replacement of the tubing. The patient denies any other symptoms  The history is provided by the patient.    Past Medical History  Diagnosis Date  . GERD (gastroesophageal reflux disease)   . Arthritis   . Hypertension   . Upper respiratory infection 05/17/13    no fever since 05/18/13  . Cancer ([place]) 2007    melanoma forehead   . Anxiety     h/o panic attacks from " stress"  . Sciatica of left side     per pt  "buldging disc- lumbar"  . Sleep apnea     uses CPAP  . Acute medial meniscal injury of knee     left  . Arthritis, septic, knee [facility])    Past Surgical History  Procedure Laterality Date  . Hernia repair    . Tonsillectomy    . Hand surgery    . Skin graft Right   . Mass excision Right 06/19/2013    Procedure: EXCISION MASS RIGHT POSTERIOR NECK;  Surgeon: Earnstine Regal, MD;  Location: [facility];  Service: General;  Laterality: Right;  . Knee arthroscopy with medial menisectomy Left 03/09/2015    Procedure: KNEE ARTHROSCOPY WITH  PARTIAL MEDIAL MENISECTOMY;  Surgeon: Earlie Server, MD;  Location: [place];  Service: Orthopedics;  Laterality: Left;  . Chondroplasty Left 03/09/2015    Procedure: CHONDROPLASTY;  Surgeon: Earlie Server, MD;  Location: [place];  Service: Orthopedics;  Laterality: Left;  . Knee arthroscopy Left 03/18/2015    Procedure: ARTHROSCOPIC, LAVAGE,  SYNOVECTOMY LEFT KNEE ;  Surgeon: Earlie Server, MD;  Location: [place];  Service: Orthopedics;  Laterality: Left;  . Knee arthroscopy Left 03/23/2015    Procedure: LEFT KNEE I&D WITH LAVAGE;  Surgeon: Earlie Server, MD;  Location: [place];  Service: Orthopedics;  Laterality: Left;   Family History  Problem Relation Age of Onset  . Cancer Mother     lymphnode  . Stroke Father    Social History  Substance Use Topics  . Smoking status: Current Every Day Smoker -- 0.50 packs/day for 20 years    Types: Cigarettes  . Smokeless tobacco: Never Used  . Alcohol Use: Yes     Comment: pt said all he can / 3-4 beers night    Review of Systems  All other systems reviewed and are negative.     Allergies  Adhesive and Strawberry extract  Home Medications   Prior to Admission medications   Medication Sig Start Date End Date Taking? Authorizing Provider  aspirin EC 325 MG tablet Take 1 tablet (325 mg total) by mouth 2 (two) times daily. 03/09/15   Joshua Chadwell, PA-C  ceFAZolin (ANCEF) 2-3 GM-% SOLR Inject 50 mLs (2 g total) into the vein every 8 (eight) hours. 03/21/15 04/29/15  Chriss Czar, PA-C  losartan-hydrochlorothiazide (HYZAAR) 100-12.5 MG per tablet Take 1 tablet by mouth daily.    Historical Provider, MD  oxyCODONE (ROXICODONE) 5 MG immediate release tablet Take 1 tablet (5 mg total) by mouth every 4 (four) hours as needed for severe pain. 03/21/15   Chriss Czar, PA-C  venlafaxine XR (EFFEXOR-XR) 75 MG 24 hr capsule Take 75 mg by mouth daily after supper.  02/08/15   Historical Provider, MD   BP 166/99 mmHg  Pulse 115  Temp(Src) 98.7 F (37.1 C) (Oral)  Resp 19  Ht 5\' 10"  (1.778 m)  Wt 210 lb (95.255 kg)  BMI 30.13 kg/m2  SpO2 97% Physical Exam  Constitutional: He appears well-developed and well-nourished.  HENT:  Head: Normocephalic and atraumatic.  Eyes: Conjunctivae are normal. Right eye exhibits no discharge. Left eye exhibits no discharge.   Pulmonary/Chest: Effort normal. No respiratory distress.  Musculoskeletal:  PICC line in place, no redness or the surrounding tissue, small amount of bleeding from the catheter and, crack and tubing  Neurological: He is alert. Coordination normal.  Skin: Skin is warm and dry. No rash noted. He is not diaphoretic. No erythema.  Psychiatric: He has a normal mood and affect.  Nursing note and vitals reviewed.   ED Course  Procedures (including critical care time) Labs Review Labs Reviewed - No data to display  Imaging Review No results found. I have personally reviewed and evaluated these images and lab results as part of my medical decision-making.    MDM   Final diagnoses:  Bleeding from PICC line [facility])    The patient has fracture of the tubing, this needs to be replaced, interventional radiology consultation, no other complaints  PICC replaced, pt stable for d/c.    Noemi Chapel, MD 04/18/15 [phone]

## 2015-04-18 NOTE — Procedures (Signed)
Successful exchange of right arm PICC.   Tip at SVC/RA junction.

## 2015-04-18 NOTE — Discharge Instructions (Signed)

## 2015-04-18 NOTE — Progress Notes (Signed)
Contacted by the ED to assess the patient's preexisting PICC line for catheter compromise and bleeding.  Arrived to find that the Right sided SL PICC tubing was bent and cracked below the clamp and above the dressing boarder. The catheter has clotted blood in the tubing up to the point of the crack. The patient has an extension on the PICC line which I removed, cleaned the hub with CHG (30 twist) and place a new cap on the end of the PICC. I did not flush the PICC line since there was blood clotted in the line. I advised that the PICC line needed to be removed to decrease risk for infection. The ED nurse, placed an order for interventional radiology to replace the PICC line.

## 2015-04-18 NOTE — ED Notes (Signed)
Pt to be transported to IR and per RN pt is to be d/c from IR.

## 2015-04-18 NOTE — ED Notes (Signed)
IV team at bedside in triage #2

## 2015-04-20 ENCOUNTER — Telehealth: Payer: Self-pay | Admitting: *Deleted

## 2015-04-20 ENCOUNTER — Encounter: Payer: Self-pay | Admitting: Internal Medicine

## 2015-04-20 ENCOUNTER — Ambulatory Visit (INDEPENDENT_AMBULATORY_CARE_PROVIDER_SITE_OTHER): Payer: Commercial Managed Care - HMO | Admitting: Internal Medicine

## 2015-04-20 VITALS — BP 152/82 | HR 99 | Temp 98.8°F | Wt 201.0 lb

## 2015-04-20 DIAGNOSIS — R739 Hyperglycemia, unspecified: Secondary | ICD-10-CM

## 2015-04-20 DIAGNOSIS — M00062 Staphylococcal arthritis, left knee: Secondary | ICD-10-CM

## 2015-04-20 DIAGNOSIS — R509 Fever, unspecified: Secondary | ICD-10-CM

## 2015-04-20 LAB — CBC WITH DIFFERENTIAL/PLATELET
Basophils Absolute: 0 10*3/uL (ref 0.0–0.1)
Basophils Relative: 0 % (ref 0–1)
EOS PCT: 3 % (ref 0–5)
Eosinophils Absolute: 0.3 10*3/uL (ref 0.0–0.7)
HEMATOCRIT: 36.5 % — AB (ref 39.0–52.0)
HEMOGLOBIN: 12.7 g/dL — AB (ref 13.0–17.0)
LYMPHS ABS: 2 10*3/uL (ref 0.7–4.0)
LYMPHS PCT: 19 % (ref 12–46)
MCH: 30.6 pg (ref 26.0–34.0)
MCHC: 34.8 g/dL (ref 30.0–36.0)
MCV: 88 fL (ref 78.0–100.0)
MONO ABS: 1.8 10*3/uL — AB (ref 0.1–1.0)
MONOS PCT: 17 % — AB (ref 3–12)
MPV: 10.1 fL (ref 8.6–12.4)
NEUTROS ABS: 6.5 10*3/uL (ref 1.7–7.7)
Neutrophils Relative %: 61 % (ref 43–77)
Platelets: 327 10*3/uL (ref 150–400)
RBC: 4.15 MIL/uL — ABNORMAL LOW (ref 4.22–5.81)
RDW: 12.9 % (ref 11.5–15.5)
WBC: 10.6 10*3/uL — ABNORMAL HIGH (ref 4.0–10.5)

## 2015-04-20 LAB — BASIC METABOLIC PANEL
BUN: 15 mg/dL (ref 7–25)
CHLORIDE: 97 mmol/L — AB (ref 98–110)
CO2: 25 mmol/L (ref 20–31)
CREATININE: 0.86 mg/dL (ref 0.70–1.33)
Calcium: 9.1 mg/dL (ref 8.6–10.3)
Glucose, Bld: 142 mg/dL — ABNORMAL HIGH (ref 65–99)
POTASSIUM: 4.1 mmol/L (ref 3.5–5.3)
Sodium: 132 mmol/L — ABNORMAL LOW (ref 135–146)

## 2015-04-20 LAB — HEMOGLOBIN A1C
Hgb A1c MFr Bld: 7.3 % — ABNORMAL HIGH (ref <5.7)
Mean Plasma Glucose: 163 mg/dL — ABNORMAL HIGH (ref <117)

## 2015-04-20 LAB — C-REACTIVE PROTEIN: CRP: 10.5 mg/dL — ABNORMAL HIGH (ref <0.60)

## 2015-04-20 NOTE — Progress Notes (Signed)
Rfv: left knee septic arthritis Subjective:    Patient ID: Adam Tyler, male    DOB: [birth year], 59 y.o.   MRN: [id number]  HPI Adam Tyler is a 59yo M who is being treated for MSSA septic arthritis of left knee. He last had 2nd washout on 9/30, OR Cx+ MSSA discharged on 4-6 wk of cefazolin. He states that he was about to finish his abtx but picc line dysfunction, bleeding requiring IR replacement on 10/31. Coincidentally having new onset chills, feverish in the last 3 days. Worsening left knee pain, unable to weight bear. His pain has been considerable, drinks alcohol occ to treat pain.   Review of labs taken on 10/31 show ongoing leukocytosis and elevated inflammatory markers taken roughl at 4 wk mark of antibiotic treatment  Allergies  Allergen Reactions  . Adhesive [Tape] Other (See Comments)    Leg raw and red, burning from adhesive bandage 03/18/15  . Strawberry Extract Hives and Nausea And Vomiting   No current facility-administered medications on file prior to visit.   Current Outpatient Prescriptions on File Prior to Visit  Medication Sig Dispense Refill  . aspirin EC 325 MG tablet Take 1 tablet (325 mg total) by mouth 2 (two) times daily. 30 tablet 0  . ceFAZolin (ANCEF) 2-3 GM-% SOLR Inject 50 mLs (2 g total) into the vein every 8 (eight) hours. 1 each 0  . losartan-hydrochlorothiazide (HYZAAR) 100-12.5 MG per tablet Take 1 tablet by mouth daily.    Marland Kitchen venlafaxine XR (EFFEXOR-XR) 75 MG 24 hr capsule Take 75 mg by mouth daily after supper.   6   Active Ambulatory Problems    Diagnosis Date Noted  . Cervical mass, right posterior chain 04/29/2013  . Wound infection after surgery 03/19/2015  . Staphylococcal arthritis of left knee ([place])   . Staphylococcus aureus infection   . Smoker   . Screen for STD (sexually transmitted disease)   . Heavy alcohol use   . Elevated liver function tests   . Pyarthrosis ([place]) 04/22/2015  . Diabetes ([place]) 04/23/2015   Resolved Ambulatory Problems     Diagnosis Date Noted  . No Resolved Ambulatory Problems   Past Medical History  Diagnosis Date  . GERD (gastroesophageal reflux disease)   . Arthritis   . Hypertension   . Upper respiratory infection 05/17/13  . Cancer ([place]) 2007  . Anxiety   . Sciatica of left side   . Acute medial meniscal injury of knee   . Arthritis, septic, knee ([place])   . Infection   . Sleep apnea   . Blood glucose elevated   . Diabetes mellitus without complication [facility])    Social History  Substance Use Topics  . Smoking status: Current Every Day Smoker -- 0.50 packs/day for 20 years    Types: Cigarettes  . Smokeless tobacco: Never Used  . Alcohol Use: Yes     Comment: pt said all he can / 3-4 beers night  family history includes Cancer in his mother; Stroke in his father.   Review of Systems 10 point ros is negative except for what is mentioned in hpi    Objective:   Physical Exam BP 152/82 mmHg  Pulse 99  Temp(Src) 98.8 F (37.1 C) (Oral)  Wt 201 lb (91.173 kg) Physical Exam  Constitutional: He is oriented to person, place, and time. He appears well-developed and well-nourished. No distress.  HENT:  Mouth/Throat: Oropharynx is clear and moist. No oropharyngeal exudate.  Cardiovascular: Normal rate, regular rhythm and  normal heart sounds. Exam reveals no gallop and no friction rub.  No murmur heard.  Pulmonary/Chest: Effort normal and breath sounds normal. No respiratory distress. He has no wheezes.  Skin: Skin is warm and dry. No rash noted. No erythema.  Ext: warm to touch + effusion to left knee. Tenderness subpatellar         Assessment & Plan:  MSSA septic arthritis = Concern for worsening infection given systemic symptoms vs. Has 2nd infection ? picc line. Recent labs on 10/31 showed still ongoing leukocytosis of 13K, elevated CRP 61  - Will get labs, blood cx today - Continue with cefazolin IV 2gm Q 8hr for 2 more weeks - spoke with caffrey's office. They will see him  tomororw at 4pm. Hopefully gets repeat aspiration to see if still has ongoing infection to decide whether he needs repeat washout. If still has ongoing infection on repeat cultures, would recommend to restart the abtx clock and treat for addn 4 wk  Hyperglycemia = concern new onset DM dx. Will check hemoglobin a1c.

## 2015-04-20 NOTE — Telephone Encounter (Signed)
Per Dr Baxter Flattery called [facility] and extended the patient IV antibiotics for 2 weeks. New stop date is 05/13/15 per verbal from Dr Baxter Flattery.

## 2015-04-21 ENCOUNTER — Ambulatory Visit: Payer: Self-pay | Admitting: Physician Assistant

## 2015-04-21 ENCOUNTER — Encounter (HOSPITAL_COMMUNITY): Payer: Self-pay | Admitting: *Deleted

## 2015-04-21 LAB — SEDIMENTATION RATE: SED RATE: 49 mm/h — AB (ref 0–20)

## 2015-04-21 NOTE — Progress Notes (Signed)
Pt denies SOB, chest pain, and being under the care of a cardiologist. Pt denies having a stress test, echo and cardiac cath. Pt denies having a chest x ray within the last year. Pt made aware to stop  taking Aspirin, otc vitamins and herbal medications. Do not take any NSAIDs ie: Ibuprofen, Advil, Naproxen or any medication containing Aspirin. Pt verbalized understanding of all pre-op instructions. 

## 2015-04-21 NOTE — H&P (Signed)
Adam Tyler is an 59 y.o. male.   Chief Complaint: left knee pain HPI: pt with continued pain swelling left knee s/p 2 lavage synovectomy, was on iv antibiotics following the second, increasing WBC count to 13 on recent labs, negative cultures from knee.    Past Medical History  Diagnosis Date  . GERD (gastroesophageal reflux disease)   . Arthritis   . Hypertension   . Upper respiratory infection 05/17/13    no fever since 05/18/13  . Cancer ([place]) 2007    melanoma forehead   . Anxiety     h/o panic attacks from " stress"  . Sciatica of left side     per pt  "buldging disc- lumbar"  . Sleep apnea     uses CPAP  . Acute medial meniscal injury of knee     left  . Arthritis, septic, knee [facility])     Past Surgical History  Procedure Laterality Date  . Hernia repair    . Tonsillectomy    . Hand surgery    . Skin graft Right   . Mass excision Right 06/19/2013    Procedure: EXCISION MASS RIGHT POSTERIOR NECK;  Surgeon: Earnstine Regal, MD;  [facility]: [facility];  Service: General;  Laterality: Right;  . Knee arthroscopy with medial menisectomy Left 03/09/2015    Procedure: KNEE ARTHROSCOPY WITH  PARTIAL MEDIAL MENISECTOMY;  Surgeon: Earlie Server, MD;  Location: [place];  Service: Orthopedics;  Laterality: Left;  . Chondroplasty Left 03/09/2015    Procedure: CHONDROPLASTY;  Surgeon: Earlie Server, MD;  Location: [place];  Service: Orthopedics;  Laterality: Left;  . Knee arthroscopy Left 03/18/2015    Procedure: ARTHROSCOPIC, LAVAGE, SYNOVECTOMY LEFT KNEE ;  Surgeon: Earlie Server, MD;  Location: [place];  Service: Orthopedics;  Laterality: Left;  . Knee arthroscopy Left 03/23/2015    Procedure: LEFT KNEE I&D WITH LAVAGE;  Surgeon: Earlie Server, MD;  Location: [place];  Service: Orthopedics;  Laterality: Left;    Family History  Problem Relation Age of Onset  . Cancer Mother     lymphnode  . Stroke Father    Social History:  reports  that he has been smoking Cigarettes.  He has a 10 pack-year smoking history. He has never used smokeless tobacco. He reports that he drinks alcohol. He reports that he does not use illicit drugs.  Allergies:  Allergies  Allergen Reactions  . Adhesive [Tape] Other (See Comments)    Leg raw and red, burning from adhesive bandage 03/18/15  . Strawberry Extract Hives and Nausea And Vomiting     (Not in a hospital admission)  Results for orders placed or performed in visit on 04/20/15 (from the past 48 hour(s))  Blood culture (routine single)     Status: None (Preliminary result)   Collection Time: 04/20/15  3:00 PM  Result Value Ref Range   Preliminary Report Blood Culture received; No Growth to date;    Preliminary Report Culture will be held for 5 days before issuing    Preliminary Report a Final Negative report.   Blood culture (routine single)     Status: None (Preliminary result)   Collection Time: 04/20/15  3:06 PM  Result Value Ref Range   Preliminary Report Blood Culture received; No Growth to date;    Preliminary Report Culture will be held for 5 days before issuing     Comment: Culture results may be compromised due to an excessive volume of blood  received in culture bottles.    Preliminary Report a Final Negative report.   Basic metabolic panel     Status: Abnormal   Collection Time: 04/20/15  3:08 PM  Result Value Ref Range   Sodium 132 (L) 135 - 146 mmol/L   Potassium 4.1 3.5 - 5.3 mmol/L   Chloride 97 (L) 98 - 110 mmol/L   CO2 25 20 - 31 mmol/L   Glucose, Bld 142 (H) 65 - 99 mg/dL   BUN 15 7 - 25 mg/dL   Creat 0.86 0.70 - 1.33 mg/dL   Calcium 9.1 8.6 - 10.3 mg/dL  CBC with Differential/Platelet     Status: Abnormal   Collection Time: 04/20/15  3:08 PM  Result Value Ref Range   WBC 10.6 (H) 4.0 - 10.5 K/uL   RBC 4.15 (L) 4.22 - 5.81 MIL/uL   Hemoglobin 12.7 (L) 13.0 - 17.0 g/dL   HCT 36.5 (L) 39.0 - 52.0 %   MCV 88.0 78.0 - 100.0 fL   MCH 30.6 26.0 - 34.0 pg    MCHC 34.8 30.0 - 36.0 g/dL   RDW 12.9 11.5 - 15.5 %   Platelets 327 150 - 400 K/uL   MPV 10.1 8.6 - 12.4 fL   Neutrophils Relative % 61 43 - 77 %   Neutro Abs 6.5 1.7 - 7.7 K/uL   Lymphocytes Relative 19 12 - 46 %   Lymphs Abs 2.0 0.7 - 4.0 K/uL   Monocytes Relative 17 (H) 3 - 12 %   Monocytes Absolute 1.8 (H) 0.1 - 1.0 K/uL   Eosinophils Relative 3 0 - 5 %   Eosinophils Absolute 0.3 0.0 - 0.7 K/uL   Basophils Relative 0 0 - 1 %   Basophils Absolute 0.0 0.0 - 0.1 K/uL   Smear Review Criteria for review not met   C-reactive protein     Status: Abnormal   Collection Time: 04/20/15  3:08 PM  Result Value Ref Range   CRP 10.5 (H) <0.60 mg/dL  Sedimentation rate     Status: Abnormal   Collection Time: 04/20/15  3:08 PM  Result Value Ref Range   Sed Rate 49 (H) 0 - 20 mm/hr  HgB A1c     Status: Abnormal   Collection Time: 04/20/15  3:08 PM  Result Value Ref Range   Hgb A1c MFr Bld 7.3 (H) <5.7 %    Comment:                                                                        According to the ADA Clinical Practice Recommendations for 2011, when HbA1c is used as a screening test:     >=6.5%   Diagnostic of Diabetes Mellitus            (if abnormal result is confirmed)   5.7-6.4%   Increased risk of developing Diabetes Mellitus   References:Diagnosis and Classification of Diabetes Mellitus,Diabetes Care,2011,34(Suppl 1):S62-S69 and Standards of Medical Care in         Diabetes - 2011,Diabetes Care,2011,34 (Suppl 1):S11-S61.      Mean Plasma Glucose 163 (H) <117 mg/dL   No results found.  Review of Systems  Constitutional: Positive for malaise/fatigue.  HENT: Negative.  Musculoskeletal: Positive for joint pain. Negative for falls.  Skin: Negative.   Neurological: Positive for weakness.  Psychiatric/Behavioral: Positive for depression.  All other systems reviewed and are negative.   There were no vitals taken for this visit. Physical Exam  Constitutional: He is  oriented to person, place, and time. He appears well-developed and well-nourished. No distress.  HENT:  Head: Normocephalic and atraumatic.  Nose: Nose normal.  Eyes: Conjunctivae and EOM are normal. Pupils are equal, round, and reactive to light.  Neck: Normal range of motion. Neck supple.  Cardiovascular: Normal rate, regular rhythm and intact distal pulses.   Respiratory: Effort normal. No respiratory distress.  GI: Soft. He exhibits no distension. There is no tenderness.  Musculoskeletal:       Left knee: He exhibits decreased range of motion, swelling and effusion. Tenderness found.  Lymphadenopathy:    He has no cervical adenopathy.  Neurological: He is alert and oriented to person, place, and time. No cranial nerve deficit.  Skin: Skin is warm and dry. No rash noted. No erythema.  Psychiatric: He has a normal mood and affect. His behavior is normal.     Assessment/Plan Left knee recurrent effusion with increased white count  Recommend left knee arthroscopic lavage synovectomy, followed by hospital admission for iv antibiotics.  Discussed risks and benefits with patient and he wishes to proceed.  Of note recent labs also show consistently elevated blood glucose in the 240 range.  He is not a diagnosed diabetic but has been told needs appt with pcp to discuss as this may be leading to some of his infection issues.    Chriss Czar 04/21/2015, 5:33 PM

## 2015-04-22 ENCOUNTER — Encounter (HOSPITAL_COMMUNITY): Payer: Self-pay | Admitting: *Deleted

## 2015-04-22 ENCOUNTER — Ambulatory Visit (HOSPITAL_COMMUNITY): Payer: Commercial Managed Care - HMO | Admitting: Certified Registered Nurse Anesthetist

## 2015-04-22 ENCOUNTER — Inpatient Hospital Stay (HOSPITAL_COMMUNITY)
Admission: RE | Admit: 2015-04-22 | Discharge: 2015-04-25 | DRG: 857 | Disposition: A | Discharge status: Home Health | Payer: Commercial Managed Care - HMO | Source: Ambulatory Visit | Attending: Orthopedic Surgery | Admitting: Orthopedic Surgery

## 2015-04-22 ENCOUNTER — Encounter (HOSPITAL_COMMUNITY)
Admission: AD | Disposition: A | Discharge status: Home Health | Payer: Self-pay | Source: Ambulatory Visit | Attending: Orthopedic Surgery

## 2015-04-22 DIAGNOSIS — F17210 Nicotine dependence, cigarettes, uncomplicated: Secondary | ICD-10-CM | POA: Diagnosis present

## 2015-04-22 DIAGNOSIS — E119 Type 2 diabetes mellitus without complications: Secondary | ICD-10-CM

## 2015-04-22 DIAGNOSIS — E1165 Type 2 diabetes mellitus with hyperglycemia: Secondary | ICD-10-CM | POA: Diagnosis not present

## 2015-04-22 DIAGNOSIS — Z720 Tobacco use: Secondary | ICD-10-CM

## 2015-04-22 DIAGNOSIS — F329 Major depressive disorder, single episode, unspecified: Secondary | ICD-10-CM | POA: Diagnosis present

## 2015-04-22 DIAGNOSIS — Z791 Long term (current) use of non-steroidal anti-inflammatories (NSAID): Secondary | ICD-10-CM

## 2015-04-22 DIAGNOSIS — T8140XA Infection following a procedure, unspecified, initial encounter: Secondary | ICD-10-CM

## 2015-04-22 DIAGNOSIS — B9561 Methicillin susceptible Staphylococcus aureus infection as the cause of diseases classified elsewhere: Secondary | ICD-10-CM | POA: Diagnosis not present

## 2015-04-22 DIAGNOSIS — M65862 Other synovitis and tenosynovitis, left lower leg: Secondary | ICD-10-CM | POA: Diagnosis present

## 2015-04-22 DIAGNOSIS — T814XXA Infection following a procedure, initial encounter: Principal | ICD-10-CM | POA: Diagnosis present

## 2015-04-22 DIAGNOSIS — Y838 Other surgical procedures as the cause of abnormal reaction of the patient, or of later complication, without mention of misadventure at the time of the procedure: Secondary | ICD-10-CM | POA: Diagnosis present

## 2015-04-22 DIAGNOSIS — M25462 Effusion, left knee: Secondary | ICD-10-CM

## 2015-04-22 DIAGNOSIS — M00069 Staphylococcal arthritis, unspecified knee: Secondary | ICD-10-CM | POA: Diagnosis not present

## 2015-04-22 DIAGNOSIS — M009 Pyogenic arthritis, unspecified: Secondary | ICD-10-CM | POA: Diagnosis not present

## 2015-04-22 DIAGNOSIS — M00062 Staphylococcal arthritis, left knee: Secondary | ICD-10-CM | POA: Diagnosis present

## 2015-04-22 DIAGNOSIS — I10 Essential (primary) hypertension: Secondary | ICD-10-CM | POA: Diagnosis present

## 2015-04-22 DIAGNOSIS — F419 Anxiety disorder, unspecified: Secondary | ICD-10-CM | POA: Diagnosis present

## 2015-04-22 DIAGNOSIS — Z85820 Personal history of malignant melanoma of skin: Secondary | ICD-10-CM

## 2015-04-22 DIAGNOSIS — K219 Gastro-esophageal reflux disease without esophagitis: Secondary | ICD-10-CM

## 2015-04-22 HISTORY — PX: KNEE ARTHROSCOPY: SHX127

## 2015-04-22 HISTORY — DX: Type 2 diabetes mellitus without complications: E11.9

## 2015-04-22 HISTORY — DX: Unspecified infectious disease: B99.9

## 2015-04-22 HISTORY — DX: Hyperglycemia, unspecified: R73.9

## 2015-04-22 LAB — GLUCOSE, CAPILLARY
GLUCOSE-CAPILLARY: 130 mg/dL — AB (ref 65–99)
GLUCOSE-CAPILLARY: 92 mg/dL (ref 65–99)
Glucose-Capillary: 203 mg/dL — ABNORMAL HIGH (ref 65–99)

## 2015-04-22 LAB — TSH: TSH: 1.612 u[IU]/mL (ref 0.350–4.500)

## 2015-04-22 SURGERY — ARTHROSCOPY, KNEE
Anesthesia: General | Site: Knee | Laterality: Left

## 2015-04-22 MED ORDER — VANCOMYCIN HCL 500 MG IV SOLR
INTRAVENOUS | Status: DC | PRN
  Administered 2015-04-22: 500 mg

## 2015-04-22 MED ORDER — PROMETHAZINE HCL 25 MG/ML IJ SOLN: 6.2500 mg | INTRAMUSCULAR | Status: DC | PRN

## 2015-04-22 MED ORDER — ONDANSETRON HCL 4 MG/2ML IJ SOLN: 4.0000 mg | Freq: Four times a day (QID) | INTRAMUSCULAR | Status: DC | PRN

## 2015-04-22 MED ORDER — BUPIVACAINE-EPINEPHRINE (PF) 0.25% -1:200000 IJ SOLN
INTRAMUSCULAR | Status: AC
  Filled 2015-04-22: qty 30

## 2015-04-22 MED ORDER — EPHEDRINE SULFATE 50 MG/ML IJ SOLN
INTRAMUSCULAR | Status: AC
  Filled 2015-04-22: qty 1

## 2015-04-22 MED ORDER — METHOCARBAMOL 500 MG PO TABS
500.0000 mg | ORAL_TABLET | Freq: Four times a day (QID) | ORAL | Status: DC | PRN
  Administered 2015-04-24 – 2015-04-25 (×2): 500 mg via ORAL
  Filled 2015-04-22 (×2): qty 1

## 2015-04-22 MED ORDER — METHOCARBAMOL 1000 MG/10ML IJ SOLN
500.0000 mg | Freq: Four times a day (QID) | INTRAVENOUS | Status: DC | PRN
  Filled 2015-04-22: qty 5

## 2015-04-22 MED ORDER — ONDANSETRON HCL 4 MG/2ML IJ SOLN
INTRAMUSCULAR | Status: AC
  Filled 2015-04-22: qty 2

## 2015-04-22 MED ORDER — FENTANYL CITRATE (PF) 100 MCG/2ML IJ SOLN
INTRAMUSCULAR | Status: DC | PRN
  Administered 2015-04-22: 150 ug via INTRAVENOUS
  Administered 2015-04-22: 50 ug via INTRAVENOUS

## 2015-04-22 MED ORDER — LACTATED RINGERS IV SOLN
INTRAVENOUS | Status: DC
  Administered 2015-04-22: 11:00:00 via INTRAVENOUS

## 2015-04-22 MED ORDER — LIDOCAINE HCL (CARDIAC) 20 MG/ML IV SOLN
INTRAVENOUS | Status: AC
  Filled 2015-04-22: qty 5

## 2015-04-22 MED ORDER — FENTANYL CITRATE (PF) 250 MCG/5ML IJ SOLN
INTRAMUSCULAR | Status: AC
  Filled 2015-04-22: qty 5

## 2015-04-22 MED ORDER — ACETAMINOPHEN 650 MG RE SUPP
650.0000 mg | Freq: Four times a day (QID) | RECTAL | Status: DC | PRN
Start: 2015-04-22 — End: 2015-04-25

## 2015-04-22 MED ORDER — PHENYLEPHRINE 40 MCG/ML (10ML) SYRINGE FOR IV PUSH (FOR BLOOD PRESSURE SUPPORT)
PREFILLED_SYRINGE | INTRAVENOUS | Status: AC
  Filled 2015-04-22: qty 10

## 2015-04-22 MED ORDER — HYDROMORPHONE HCL 1 MG/ML IJ SOLN
0.2500 mg | INTRAMUSCULAR | Status: DC | PRN
  Administered 2015-04-22 (×2): 0.5 mg via INTRAVENOUS

## 2015-04-22 MED ORDER — LACTATED RINGERS IV SOLN
INTRAVENOUS | Status: DC | PRN
  Administered 2015-04-22 (×2): via INTRAVENOUS

## 2015-04-22 MED ORDER — ACETAMINOPHEN 325 MG PO TABS: 650.0000 mg | ORAL_TABLET | Freq: Four times a day (QID) | ORAL | Status: DC | PRN

## 2015-04-22 MED ORDER — ADULT MULTIVITAMIN W/MINERALS CH
1.0000 | ORAL_TABLET | Freq: Every day | ORAL | Status: DC
  Administered 2015-04-22 – 2015-04-25 (×4): 1 via ORAL
  Filled 2015-04-22 (×4): qty 1

## 2015-04-22 MED ORDER — METOCLOPRAMIDE HCL 5 MG PO TABS: 5.0000 mg | ORAL_TABLET | Freq: Three times a day (TID) | ORAL | Status: DC | PRN

## 2015-04-22 MED ORDER — SODIUM CHLORIDE 0.9 % IV SOLN: INTRAVENOUS | Status: DC

## 2015-04-22 MED ORDER — KETOROLAC TROMETHAMINE 30 MG/ML IJ SOLN
INTRAMUSCULAR | Status: DC | PRN
  Administered 2015-04-22: 30 mg via INTRAVENOUS

## 2015-04-22 MED ORDER — CEFAZOLIN SODIUM-DEXTROSE 2-3 GM-% IV SOLR
2.0000 g | Freq: Three times a day (TID) | INTRAVENOUS | Status: DC
  Administered 2015-04-22 – 2015-04-25 (×8): 2 g via INTRAVENOUS
  Filled 2015-04-22 (×11): qty 50

## 2015-04-22 MED ORDER — PANTOPRAZOLE SODIUM 40 MG PO TBEC
40.0000 mg | DELAYED_RELEASE_TABLET | Freq: Every day | ORAL | Status: DC
  Administered 2015-04-23 – 2015-04-24 (×2): 40 mg via ORAL
  Filled 2015-04-22 (×3): qty 1

## 2015-04-22 MED ORDER — BUPIVACAINE-EPINEPHRINE 0.5% -1:200000 IJ SOLN
INTRAMUSCULAR | Status: DC | PRN
  Administered 2015-04-22: 30 mg

## 2015-04-22 MED ORDER — GENTAMICIN SULFATE 40 MG/ML IJ SOLN
INTRAMUSCULAR | Status: AC
  Filled 2015-04-22: qty 4

## 2015-04-22 MED ORDER — MIDAZOLAM HCL 2 MG/2ML IJ SOLN: 0.5000 mg | Freq: Once | INTRAMUSCULAR | Status: DC | PRN

## 2015-04-22 MED ORDER — CHLORHEXIDINE GLUCONATE 4 % EX LIQD: 60.0000 mL | Freq: Once | CUTANEOUS | Status: DC

## 2015-04-22 MED ORDER — INSULIN ASPART 100 UNIT/ML ~~LOC~~ SOLN
0.0000 [IU] | Freq: Every day | SUBCUTANEOUS | Status: DC
  Administered 2015-04-23: 2 [IU] via SUBCUTANEOUS

## 2015-04-22 MED ORDER — HYDROMORPHONE HCL 1 MG/ML IJ SOLN
1.0000 mg | INTRAMUSCULAR | Status: DC | PRN
  Administered 2015-04-22 – 2015-04-23 (×2): 1 mg via INTRAVENOUS
  Filled 2015-04-22 (×2): qty 1

## 2015-04-22 MED ORDER — PROPOFOL 10 MG/ML IV BOLUS
INTRAVENOUS | Status: DC | PRN
  Administered 2015-04-22: 200 mg via INTRAVENOUS

## 2015-04-22 MED ORDER — LOSARTAN POTASSIUM-HCTZ 100-12.5 MG PO TABS: 1.0000 | ORAL_TABLET | Freq: Every day | ORAL | Status: DC

## 2015-04-22 MED ORDER — FLEET ENEMA 7-19 GM/118ML RE ENEM: 1.0000 | ENEMA | Freq: Once | RECTAL | Status: DC | PRN

## 2015-04-22 MED ORDER — VANCOMYCIN HCL 500 MG IV SOLR
INTRAVENOUS | Status: AC
  Filled 2015-04-22: qty 500

## 2015-04-22 MED ORDER — METOCLOPRAMIDE HCL 5 MG/ML IJ SOLN: 5.0000 mg | Freq: Three times a day (TID) | INTRAMUSCULAR | Status: DC | PRN

## 2015-04-22 MED ORDER — HYDROMORPHONE HCL 1 MG/ML IJ SOLN
INTRAMUSCULAR | Status: AC
  Administered 2015-04-22: 0.5 mg via INTRAVENOUS
  Filled 2015-04-22: qty 1

## 2015-04-22 MED ORDER — LIDOCAINE HCL (CARDIAC) 20 MG/ML IV SOLN
INTRAVENOUS | Status: DC | PRN
  Administered 2015-04-22: 40 mg via INTRAVENOUS

## 2015-04-22 MED ORDER — SENNOSIDES-DOCUSATE SODIUM 8.6-50 MG PO TABS: 1.0000 | ORAL_TABLET | Freq: Every evening | ORAL | Status: DC | PRN

## 2015-04-22 MED ORDER — LOSARTAN POTASSIUM 50 MG PO TABS
100.0000 mg | ORAL_TABLET | Freq: Every day | ORAL | Status: DC
  Administered 2015-04-22 – 2015-04-25 (×4): 100 mg via ORAL
  Filled 2015-04-22 (×4): qty 2

## 2015-04-22 MED ORDER — HYDROCHLOROTHIAZIDE 12.5 MG PO CAPS
12.5000 mg | ORAL_CAPSULE | Freq: Every day | ORAL | Status: DC
  Administered 2015-04-22 – 2015-04-25 (×4): 12.5 mg via ORAL
  Filled 2015-04-22 (×4): qty 1

## 2015-04-22 MED ORDER — SODIUM CHLORIDE 0.9 % IJ SOLN
INTRAMUSCULAR | Status: AC
  Filled 2015-04-22: qty 10

## 2015-04-22 MED ORDER — DIPHENHYDRAMINE HCL 12.5 MG/5ML PO ELIX
12.5000 mg | ORAL_SOLUTION | ORAL | Status: DC | PRN
  Administered 2015-04-22: 25 mg via ORAL
  Filled 2015-04-22 (×2): qty 10

## 2015-04-22 MED ORDER — GENTAMICIN SULFATE 40 MG/ML IJ SOLN
INTRAMUSCULAR | Status: DC | PRN
Start: 2015-04-22 — End: 2015-04-22
  Administered 2015-04-22: 160 mg via INTRAMUSCULAR

## 2015-04-22 MED ORDER — VENLAFAXINE HCL ER 75 MG PO CP24
75.0000 mg | ORAL_CAPSULE | Freq: Every day | ORAL | Status: DC
  Administered 2015-04-23 – 2015-04-24 (×3): 75 mg via ORAL
  Filled 2015-04-22 (×3): qty 1

## 2015-04-22 MED ORDER — MIDAZOLAM HCL 2 MG/2ML IJ SOLN
INTRAMUSCULAR | Status: AC
  Filled 2015-04-22: qty 4

## 2015-04-22 MED ORDER — ASPIRIN EC 325 MG PO TBEC
325.0000 mg | DELAYED_RELEASE_TABLET | Freq: Two times a day (BID) | ORAL | Status: DC
  Administered 2015-04-22 – 2015-04-25 (×6): 325 mg via ORAL
  Filled 2015-04-22 (×6): qty 1

## 2015-04-22 MED ORDER — MEPERIDINE HCL 25 MG/ML IJ SOLN: 6.2500 mg | INTRAMUSCULAR | Status: DC | PRN

## 2015-04-22 MED ORDER — INSULIN ASPART 100 UNIT/ML ~~LOC~~ SOLN
0.0000 [IU] | Freq: Three times a day (TID) | SUBCUTANEOUS | Status: DC
  Administered 2015-04-23: 1 [IU] via SUBCUTANEOUS
  Administered 2015-04-23: 2 [IU] via SUBCUTANEOUS
  Administered 2015-04-23: 1 [IU] via SUBCUTANEOUS
  Administered 2015-04-24: 2 [IU] via SUBCUTANEOUS
  Administered 2015-04-24 (×2): 1 [IU] via SUBCUTANEOUS
  Administered 2015-04-25: 2 [IU] via SUBCUTANEOUS

## 2015-04-22 MED ORDER — ONDANSETRON HCL 4 MG/2ML IJ SOLN
INTRAMUSCULAR | Status: DC | PRN
  Administered 2015-04-22: 4 mg via INTRAVENOUS

## 2015-04-22 MED ORDER — PROPOFOL 10 MG/ML IV BOLUS
INTRAVENOUS | Status: AC
  Filled 2015-04-22: qty 20

## 2015-04-22 MED ORDER — ONDANSETRON HCL 4 MG PO TABS: 4.0000 mg | ORAL_TABLET | Freq: Four times a day (QID) | ORAL | Status: DC | PRN

## 2015-04-22 MED ORDER — CEFAZOLIN SODIUM-DEXTROSE 2-3 GM-% IV SOLR
INTRAVENOUS | Status: DC | PRN
Start: 2015-04-22 — End: 2015-04-22
  Administered 2015-04-22: 2 g via INTRAVENOUS

## 2015-04-22 MED ORDER — BISACODYL 5 MG PO TBEC: 5.0000 mg | DELAYED_RELEASE_TABLET | Freq: Every day | ORAL | Status: DC | PRN

## 2015-04-22 MED ORDER — SODIUM CHLORIDE 0.9 % IR SOLN
Status: DC | PRN
  Administered 2015-04-22 (×5): 3000 mL

## 2015-04-22 MED ORDER — NICOTINE 14 MG/24HR TD PT24
14.0000 mg | MEDICATED_PATCH | Freq: Every day | TRANSDERMAL | Status: DC
  Filled 2015-04-22 (×3): qty 1

## 2015-04-22 MED ORDER — MIDAZOLAM HCL 5 MG/5ML IJ SOLN
INTRAMUSCULAR | Status: DC | PRN
  Administered 2015-04-22: 2 mg via INTRAVENOUS

## 2015-04-22 MED ORDER — OXYCODONE HCL 5 MG PO TABS
5.0000 mg | ORAL_TABLET | ORAL | Status: DC | PRN
  Administered 2015-04-23 – 2015-04-25 (×6): 10 mg via ORAL
  Filled 2015-04-22 (×6): qty 2

## 2015-04-22 MED ORDER — DOCUSATE SODIUM 100 MG PO CAPS
100.0000 mg | ORAL_CAPSULE | Freq: Two times a day (BID) | ORAL | Status: DC
  Administered 2015-04-22 – 2015-04-25 (×6): 100 mg via ORAL
  Filled 2015-04-22 (×6): qty 1

## 2015-04-22 MED ORDER — SUCCINYLCHOLINE CHLORIDE 20 MG/ML IJ SOLN
INTRAMUSCULAR | Status: AC
  Filled 2015-04-22: qty 1

## 2015-04-22 SURGICAL SUPPLY — 52 items
BANDAGE ELASTIC 4 VELCRO ST LF (GAUZE/BANDAGES/DRESSINGS) ×1 IMPLANT
BANDAGE ELASTIC 6 VELCRO ST LF (GAUZE/BANDAGES/DRESSINGS) ×1 IMPLANT
BLADE GREAT WHITE 4.2 (BLADE) IMPLANT
BLADE SURG 11 STRL SS (BLADE) IMPLANT
BLADE SURG ROTATE 9660 (MISCELLANEOUS) IMPLANT
BNDG GAUZE ELAST 4 BULKY (GAUZE/BANDAGES/DRESSINGS) ×2 IMPLANT
COVER SURGICAL LIGHT HANDLE (MISCELLANEOUS) ×2 IMPLANT
DRAPE ARTHROSCOPY W/POUCH 114 (DRAPES) ×2 IMPLANT
DRAPE U-SHAPE 47X51 STRL (DRAPES) ×2 IMPLANT
DRSG PAD ABDOMINAL 8X10 ST (GAUZE/BANDAGES/DRESSINGS) ×2 IMPLANT
DURAPREP 26ML APPLICATOR (WOUND CARE) ×2 IMPLANT
FILTER STRAW FLUID ASPIR (MISCELLANEOUS) ×2 IMPLANT
GAUZE PACKING IODOFORM 1/4X15 (GAUZE/BANDAGES/DRESSINGS) ×1 IMPLANT
GAUZE SPONGE 4X4 12PLY STRL (GAUZE/BANDAGES/DRESSINGS) IMPLANT
GAUZE XEROFORM 1X8 LF (GAUZE/BANDAGES/DRESSINGS) IMPLANT
GLOVE BIOGEL PI IND STRL 8 (GLOVE) ×2 IMPLANT
GLOVE BIOGEL PI INDICATOR 8 (GLOVE) ×2
GLOVE ORTHO TXT STRL SZ7.5 (GLOVE) ×4 IMPLANT
GLOVE SURG ORTHO 8.0 STRL STRW (GLOVE) ×4 IMPLANT
GOWN STRL REUS W/ TWL LRG LVL3 (GOWN DISPOSABLE) ×2 IMPLANT
GOWN STRL REUS W/ TWL XL LVL3 (GOWN DISPOSABLE) ×1 IMPLANT
GOWN STRL REUS W/TWL LRG LVL3 (GOWN DISPOSABLE) ×4
GOWN STRL REUS W/TWL XL LVL3 (GOWN DISPOSABLE) ×2
IMMOBILIZER KNEE 22 UNIV (SOFTGOODS) ×1 IMPLANT
KIT ROOM TURNOVER OR (KITS) ×2 IMPLANT
KIT STIMULAN RAPID CURE  10CC (Orthopedic Implant) ×1 IMPLANT
KIT STIMULAN RAPID CURE 10CC (Orthopedic Implant) IMPLANT
MANIFOLD NEPTUNE II (INSTRUMENTS) IMPLANT
NDL 18GX1X1/2 (RX/OR ONLY) (NEEDLE) IMPLANT
NDL HYPO 25GX1X1/2 BEV (NEEDLE) IMPLANT
NDL SPNL 18GX3.5 QUINCKE PK (NEEDLE) IMPLANT
NEEDLE 18GX1X1/2 (RX/OR ONLY) (NEEDLE) IMPLANT
NEEDLE HYPO 25GX1X1/2 BEV (NEEDLE) IMPLANT
NEEDLE SPNL 18GX3.5 QUINCKE PK (NEEDLE) IMPLANT
NS IRRIG 1000ML POUR BTL (IV SOLUTION) IMPLANT
PACK ARTHROSCOPY DSU (CUSTOM PROCEDURE TRAY) ×2 IMPLANT
PAD ARMBOARD 7.5X6 YLW CONV (MISCELLANEOUS) ×4 IMPLANT
SET ARTHROSCOPY TUBING (MISCELLANEOUS) ×2
SET ARTHROSCOPY TUBING LN (MISCELLANEOUS) ×1 IMPLANT
SPONGE GAUZE 4X4 12PLY STER LF (GAUZE/BANDAGES/DRESSINGS) ×1 IMPLANT
SPONGE LAP 4X18 X RAY DECT (DISPOSABLE) ×2 IMPLANT
SUT ETHILON 4 0 PS 2 18 (SUTURE) ×2 IMPLANT
SUT SILK 2 0 FS (SUTURE) ×1 IMPLANT
SUT VIC AB 2-0 CT1 27 (SUTURE) ×2
SUT VIC AB 2-0 CT1 TAPERPNT 27 (SUTURE) IMPLANT
SYR 20ML ECCENTRIC (SYRINGE) IMPLANT
SYR 3ML 25GX5/8 SAFETY (SYRINGE) ×2 IMPLANT
SYR CONTROL 10ML LL (SYRINGE) IMPLANT
TOWEL OR 17X24 6PK STRL BLUE (TOWEL DISPOSABLE) ×2 IMPLANT
TOWEL OR 17X26 10 PK STRL BLUE (TOWEL DISPOSABLE) ×2 IMPLANT
TUBE CONNECTING 12X1/4 (SUCTIONS) ×2 IMPLANT
WATER STERILE IRR 1000ML POUR (IV SOLUTION) ×2 IMPLANT

## 2015-04-22 NOTE — Anesthesia Procedure Notes (Signed)
Procedure Name: LMA Insertion Date/Time: 04/22/2015 1:25 PM Performed by: Carney Living Pre-anesthesia Checklist: Patient identified, Emergency Drugs available, Suction available, Patient being monitored and Timeout performed Patient Re-evaluated:Patient Re-evaluated prior to inductionOxygen Delivery Method: Circle system utilized Preoxygenation: Pre-oxygenation with 100% oxygen Intubation Type: IV induction LMA: LMA inserted LMA Size: 5.0 Number of attempts: 1 Placement Confirmation: positive ETCO2 and breath sounds checked- equal and bilateral Tube secured with: Tape Dental Injury: Teeth and Oropharynx as per pre-operative assessment

## 2015-04-22 NOTE — Transfer of Care (Signed)
Immediate Anesthesia Transfer of Care Note  Patient: Adam Tyler  Procedure(s) Performed: Procedure(s): LEFT KNEE ARTHROSCOPY WITH LAVAGE AND DRAINING WITH SYNOVECTOMY (Left)  Patient Location: PACU  Anesthesia Type:General  Level of Consciousness: awake, alert , oriented and patient cooperative  Airway & Oxygen Therapy: Patient Spontanous Breathing and Patient connected to nasal cannula oxygen  Post-op Assessment: Report given to RN, Post -op Vital signs reviewed and stable and Patient moving all extremities X 4  Post vital signs: Reviewed and stable  Last Vitals:  Filed Vitals:   04/22/15 0946  BP: 141/92  Pulse: 92  Temp: 36.9 C  Resp: 18    Complications: No apparent anesthesia complications

## 2015-04-22 NOTE — Consult Note (Signed)
[facility] Medical Consultation  LONZIE SIMMER LOV:[id number] DOB: 1956/05/09 DOA: 04/22/2015 PCP: Imelda Pillow, NP   Requesting physician: Dr. French Ana  Date of consultation: 04-22-2015 Reason for consultation: Elevated blood sugar and A1c of 7.3  Impression/Recommendations 1-left knee requiring effusion and increased WBCs: -Status post arthroscopy and lavage -Continue IV cefazolin -When necessary analgesics -Further treatment as per primary team  2-newly diagnosed diabetes type 2: Without apparent complications -O8C 7.3 -While inpatient will treat with sliding scale insulin and if needed initiate low dose of long acting insulin (ongoing infection and surgical procedure, most likely contributing to patient elevated CBGs). -Recommend at discharge initiation of metformin 500 mg twice a day and close follow-up by PCP for further adjustment of his hypoglycemic regimen based on fluctuation of his CBGs. -Patient educated about low carbohydrates diet and substitution to diet/light products to decrease sugar intake -In order to assist with further a stratification will check TSH and lipid profile during this admission.  3-hypertension: Stable and well control -Will continue current antihypertensive regimen (Cozaar and HCTZ)  4-gastroesophageal reflux disease: Continue PPI  5-anxiety/depression: Stable and currently without SI or hallucinations -Will continue the use of Effexor  6-tobacco abuse: Cessation counseling has been provided -Patient started on nicotine patch  I will followup again tomorrow. Please contact me if I can be of assistance in the meanwhile. Thank you for this consultation.  Chief Complaint: Left knee pain, swelling and elevated wbc's  HPI:  59 year old male with a past medical history significant for hypertension, gastroesophageal reflux disease, anxiety/depression and left septic knee arthritis; who has continue experiencing pain and swelling on his  left knee after 2 arthroscopy with lavages and synovectomies; admitted for another arthroscopy. Currently on IV cefazolin as indicated by ID. Was found to have elevated CBG's and recent A1C of 7.3; [facility] has at this moment being consulted in order to assist with treatment and recommendation for his medical problems.  Review of Systems:  Negative except as mentioned on HPI.  Past Medical History  Diagnosis Date  . GERD (gastroesophageal reflux disease)   . Arthritis   . Hypertension   . Upper respiratory infection 05/17/13    no fever since 05/18/13  . Cancer ([place]) 2007    melanoma forehead   . Anxiety     h/o panic attacks from " stress"  . Sciatica of left side     per pt  "buldging disc- lumbar"  . Acute medial meniscal injury of knee     left  . Arthritis, septic, knee ([place])   . Infection     left knee  . Sleep apnea     pt denies  . Blood glucose elevated   . Diabetes mellitus without complication ([place])     ZYS0Y 04/20/15 was 7.3    Past Surgical History  Procedure Laterality Date  . Hernia repair    . Tonsillectomy    . Hand surgery    . Skin graft Right   . Mass excision Right 06/19/2013    Procedure: EXCISION MASS RIGHT POSTERIOR NECK;  Surgeon: Earnstine Regal, MD;  [facility];  Service: General;  Laterality: Right;  . Knee arthroscopy with medial menisectomy Left 03/09/2015    Procedure: KNEE ARTHROSCOPY WITH  PARTIAL MEDIAL MENISECTOMY;  Surgeon: Earlie Server, MD;  Location: [place];  Service: Orthopedics;  Laterality: Left;  . Chondroplasty Left 03/09/2015    Procedure: CHONDROPLASTY;  Surgeon: Earlie Server, MD;  Location: [place];  Service: Orthopedics;  Laterality: Left;  . Knee arthroscopy Left 03/18/2015    Procedure: ARTHROSCOPIC, LAVAGE, SYNOVECTOMY LEFT KNEE ;  Surgeon: Earlie Server, MD;  Location: [place];  Service: Orthopedics;  Laterality: Left;  . Knee arthroscopy Left 03/23/2015    Procedure: LEFT KNEE  I&D WITH LAVAGE;  Surgeon: Earlie Server, MD;  Location: [place];  Service: Orthopedics;  Laterality: Left;   Social History:  reports that he has been smoking Cigarettes.  He has a 10 pack-year smoking history. He has never used smokeless tobacco. He reports that he drinks alcohol. He reports that he does not use illicit drugs.  Allergies  Allergen Reactions  . Adhesive [Tape] Other (See Comments)    Leg raw and red, burning from adhesive bandage 03/18/15  . Strawberry Extract Hives and Nausea And Vomiting   Family History  Problem Relation Age of Onset  . Cancer Mother     lymphnode  . Stroke Father     Prior to Admission medications   Medication Sig Start Date End Date Taking? Authorizing Provider  aspirin EC 325 MG tablet Take 1 tablet (325 mg total) by mouth 2 (two) times daily. 03/09/15  Yes Joshua Chadwell, PA-C  ceFAZolin (ANCEF) 2-3 GM-% SOLR Inject 50 mLs (2 g total) into the vein every 8 (eight) hours. 03/21/15 04/29/15 Yes Joshua Chadwell, PA-C  losartan-hydrochlorothiazide (HYZAAR) 100-12.5 MG per tablet Take 1 tablet by mouth daily.   Yes Historical Provider, MD  meloxicam (MOBIC) 15 MG tablet Take 15 mg by mouth daily. with food 04/13/15  Yes Historical Provider, MD  venlafaxine XR (EFFEXOR-XR) 75 MG 24 hr capsule Take 75 mg by mouth daily after supper.  02/08/15  Yes Historical Provider, MD   Physical Exam: Blood pressure 138/76, pulse 80, temperature 98.2 F (36.8 C), temperature source Oral, resp. rate 15, height 5\' 10"  (1.778 m), weight 91.173 kg (201 lb), SpO2 97 %. Filed Vitals:   04/22/15 1805  BP: 138/76  Pulse: 80  Temp: 98.2 F (36.8 C)  Resp: 15     General:  Afebrile, denies CP and SOB. No acute distress. Reports no pain on his left knee currently.  Eyes: PERRL, EOMI, no icterus  ENT: no drainage out of ears or nostrils, no erythema or exudates inside his mouth; MMM  Neck: no JVD, no thyromegaly   Cardiovascular: S1 and S2, no  rubs, no gallops, no murmurs   Respiratory: CTA bilaterally, good air movement   Abdomen: soft, NT, ND, positive BS  Skin: no rashes or petechiae; LLE with brace/knee immobilizer in place  Musculoskeletal: no edema or cyanosis   Psychiatric: stable mood and affect; no hallucinations, no SI. Good insight   Neurologic: no focal neurologic deficit appreciated, CN intact, able to follow 3 steps commands with problem.  Labs on Admission:  Basic Metabolic Panel:  Recent Labs Lab 04/20/15 1508  NA 132*  K 4.1  CL 97*  CO2 25  GLUCOSE 142*  BUN 15  CREATININE 0.86  CALCIUM 9.1   CBC:  Recent Labs Lab 04/20/15 1508  WBC 10.6*  NEUTROABS 6.5  HGB 12.7*  HCT 36.5*  MCV 88.0  PLT 327   CBG:  Recent Labs Lab 04/22/15 1001 04/22/15 1513  GLUCAP 130* 92    Radiological Exams on Admission: No results found.  EKG:  None  Time spent: 50 minutes  Barton Dubois [facility] Pager [phone]  If 7PM-7AM, please contact night-coverage [email or web address] Password [facility] 04/22/2015, 6:34 PM

## 2015-04-22 NOTE — Progress Notes (Signed)
Pt arrived from pacu to unit at 1810; VSS; pt A&O x4; IV intact and transfusing; SL PICC remains capped intact to right arm; LLE dsg remains clean dry and intact with Immobilizer in place; pt spouse at bedside; LLE neuro check wnl; pt in bed with spouse at bedside; reported off to oncoming RN. Francis Gaines Amiere Cawley RN.

## 2015-04-22 NOTE — Anesthesia Preprocedure Evaluation (Addendum)
Anesthesia Evaluation  Patient identified by MRN, date of birth, ID band Patient awake    Reviewed: Allergy & Precautions, NPO status , Patient's Chart, lab work & pertinent test results  History of Anesthesia Complications Negative for: history of anesthetic complications  Airway Mallampati: II  TM Distance: >3 FB Neck ROM: Full    Dental  (+) Dental Advisory Given, Poor Dentition   Pulmonary Current Smoker,    breath sounds clear to auscultation       Cardiovascular hypertension, Pt. on medications (-) angina Rhythm:Regular Rate:Normal     Neuro/Psych negative neurological ROS     GI/Hepatic GERD  Controlled,(+)     substance abuse  alcohol use,   Endo/Other  diabetes (glu 130)Pt denies DM, glu 130  Renal/GU negative Renal ROS     Musculoskeletal  (+) Arthritis , Osteoarthritis,    Abdominal   Peds  Hematology negative hematology ROS (+)   Anesthesia Other Findings   Reproductive/Obstetrics                           Anesthesia Physical Anesthesia Plan  ASA: III  Anesthesia Plan: General   Post-op Pain Management:    Induction: Intravenous  Airway Management Planned: LMA  Additional Equipment:   Intra-op Plan:   Post-operative Plan:   Informed Consent: I have reviewed the patients History and Physical, chart, labs and discussed the procedure including the risks, benefits and alternatives for the proposed anesthesia with the patient or authorized representative who has indicated his/her understanding and acceptance.   Dental advisory given  Plan Discussed with: CRNA, Surgeon and Anesthesiologist  Anesthesia Plan Comments: (Plan routine monitors, GA- LMA OK)       Anesthesia Quick Evaluation

## 2015-04-22 NOTE — Progress Notes (Signed)
[facility]  Patient Status:  Active pt with [facility] prior to this admission   [facility] is providing the following services: HHRN and Home infusion pharmacy for home IV ABX. [facility] hospital team will follow [name] while an inpatient to support hospital team with DC home when ordered.  If patient discharges after hours, please call [phone].   Adam Tyler 04/22/2015, 12:47 PM

## 2015-04-22 NOTE — Anesthesia Postprocedure Evaluation (Signed)
  Anesthesia Post-op Note  Patient: Adam Tyler  Procedure(s) Performed: Procedure(s): LEFT KNEE ARTHROSCOPY WITH LAVAGE AND DRAINING WITH SYNOVECTOMY (Left)  Patient Location: PACU  Anesthesia Type: General   Level of Consciousness: awake, alert  and oriented  Airway and Oxygen Therapy: Patient Spontanous Breathing  Post-op Pain: mild  Post-op Assessment: Post-op Vital signs reviewed  Post-op Vital Signs: Reviewed  Last Vitals:  Filed Vitals:   04/22/15 1724  BP: 121/50  Pulse:   Temp: 36.6 C  Resp:     Complications: No apparent anesthesia complications

## 2015-04-23 DIAGNOSIS — E1165 Type 2 diabetes mellitus with hyperglycemia: Secondary | ICD-10-CM | POA: Insufficient documentation

## 2015-04-23 DIAGNOSIS — E119 Type 2 diabetes mellitus without complications: Secondary | ICD-10-CM

## 2015-04-23 LAB — COMPREHENSIVE METABOLIC PANEL
ALT: 29 U/L (ref 17–63)
AST: 52 U/L — AB (ref 15–41)
Albumin: 2.8 g/dL — ABNORMAL LOW (ref 3.5–5.0)
Alkaline Phosphatase: 72 U/L (ref 38–126)
Anion gap: 9 (ref 5–15)
BUN: 16 mg/dL (ref 6–20)
CHLORIDE: 97 mmol/L — AB (ref 101–111)
CO2: 26 mmol/L (ref 22–32)
Calcium: 9 mg/dL (ref 8.9–10.3)
Creatinine, Ser: 0.78 mg/dL (ref 0.61–1.24)
Glucose, Bld: 171 mg/dL — ABNORMAL HIGH (ref 65–99)
POTASSIUM: 4.1 mmol/L (ref 3.5–5.1)
Sodium: 132 mmol/L — ABNORMAL LOW (ref 135–145)
Total Bilirubin: 0.2 mg/dL — ABNORMAL LOW (ref 0.3–1.2)
Total Protein: 6 g/dL — ABNORMAL LOW (ref 6.5–8.1)

## 2015-04-23 LAB — GLUCOSE, CAPILLARY
GLUCOSE-CAPILLARY: 143 mg/dL — AB (ref 65–99)
GLUCOSE-CAPILLARY: 142 mg/dL — AB (ref 65–99)
GLUCOSE-CAPILLARY: 118 mg/dL — AB (ref 65–99)
Glucose-Capillary: 183 mg/dL — ABNORMAL HIGH (ref 65–99)

## 2015-04-23 LAB — LIPID PANEL
CHOL/HDL RATIO: 4 ratio
CHOLESTEROL: 145 mg/dL (ref 0–200)
HDL: 36 mg/dL — ABNORMAL LOW (ref >40)
LDL Cholesterol: 98 mg/dL (ref 0–99)
TRIGLYCERIDES: 55 mg/dL (ref <150)
VLDL: 11 mg/dL (ref 0–40)

## 2015-04-23 MED ORDER — PRO-STAT SUGAR FREE PO LIQD
30.0000 mL | Freq: Two times a day (BID) | ORAL | Status: DC
  Administered 2015-04-23 – 2015-04-25 (×4): 30 mL via ORAL
  Filled 2015-04-23 (×4): qty 30

## 2015-04-23 MED ORDER — METFORMIN HCL 500 MG PO TABS
500.0000 mg | ORAL_TABLET | Freq: Two times a day (BID) | ORAL | Status: DC
  Administered 2015-04-23 – 2015-04-25 (×4): 500 mg via ORAL
  Filled 2015-04-23 (×5): qty 1

## 2015-04-23 MED ORDER — METFORMIN HCL 500 MG PO TABS: 500.0000 mg | ORAL_TABLET | Freq: Two times a day (BID) | ORAL | Status: DC

## 2015-04-23 NOTE — Op Note (Signed)
NAMEALGER, KERSTEIN NO.:  [id number]  MEDICAL RECORD NO.:  [id number]  LOCATION:  5N09C                        FACILITY:  [facility]  PHYSICIAN:  Lockie Pares, M.D.    DATE OF BIRTH:  1956-01-27  DATE OF PROCEDURE:  04/22/2015 DATE OF DISCHARGE:                              OPERATIVE REPORT   INDICATIONS:  This is a 59 year old with a methicillin-sensitive Staph infection of his knee after arthroscopy, treated with 2 irrigation and debridement lavages as an outpatient, had an Infectious Disease consult with appropriate IV antibiotics.  Presented to the office with over 2- week period with some progressive knee pain.  He had his knee aspirated approximately 8 days ago of some seropurulent material.  His white count was normalizing, but he developed some more swelling and increased pain with his white count elevation to over 13,000, it was thought that repeat irrigation and lavage was indicated and possible hospitalization was as well.  PREOPERATIVE DIAGNOSIS:  Recurrent pyarthrosis with synovitis, left knee.  POSTOPERATIVE DIAGNOSIS:  Recurrent pyarthrosis with synovitis, left knee.  OPERATION: 1. Arthroscopic lavage. 2. Arthroscopic synovectomy; all for the left knee.  SURGEON:  Lockie Pares, M.D.  ANESTHESIA:  General anesthetic with local supplementation.  DESCRIPTION OF PROCEDURE:  Arthroscoped to inferomedial inferolateral portal, we enlarged the lateral portal to insert some Stimulan impregnated absorbable antibiotic beads with gentamicin and vancomycin at the conclusion of the case; however, on entering the knee, there was some purulent material that was not probably more than 5-10 mL, it was relatively thick, some exudative material was debrided.  We did an aggressive intra-articular debridement of some exudate off the bone, again the findings, status post meniscectomies and mild-to-moderate changes of osteoarthritis on the patellofemoral joint  medial compartment relative sparing the lateral compartment.  I did extensive synovectomy and again irrigated at this point with 15,000 mL of lavage, placed a Penrose quarter-inch drain in the inferior medial portal and we placed the Stimulan bead into a mini arthrotomy, which was closed with 0- Vicryl, 2-0 Vicryl, and nylon.  We infiltrated the joint and the skin with 30 mL of 0.25% plain Marcaine.  Lightly compressive sterile dressing and knee immobilizer applied.  Taken to the recovery room in a stable condition.    Lockie Pares, M.D.    WDC/MEDQ  D:  04/22/2015  T:  04/23/2015  Job:  [id number]

## 2015-04-23 NOTE — Progress Notes (Signed)
PROGRESS NOTE  Adam Tyler YSA:[id number] DOB: 12-Sep-1955 DOA: 04/22/2015 PCP: Adam Pillow, NP  Assessment/Plan: DM- new onset -start metformin BID- further outpatient titration per PCP prescription in chart-- will need major dietary changes (sweet tea, soda)-- spoke at length -will be available if needs arise [phone]        HPI/Subjective: Eating lunch, no complaints  Objective: Filed Vitals:   04/23/15 1205  BP: 146/78  Pulse: 93  Temp: 100.7 F (38.2 C)  Resp: 16    Intake/Output Summary (Last 24 hours) at 04/23/15 1423 Last data filed at 04/23/15 0845  Gross per 24 hour  Intake    440 ml  Output      0 ml  Net    440 ml   Filed Weights   04/22/15 0946 04/22/15 1020  Weight: 91.173 kg (201 lb) 91.173 kg (201 lb)    Exam:   General:  Awake, NAD, A+Ox3   Data Reviewed: Basic Metabolic Panel:  Recent Labs Lab 04/20/15 1508 04/23/15 0453  NA 132* 132*  K 4.1 4.1  CL 97* 97*  CO2 25 26  GLUCOSE 142* 171*  BUN 15 16  CREATININE 0.86 0.78  CALCIUM 9.1 9.0   Liver Function Tests:  Recent Labs Lab 04/23/15 0453  AST 52*  ALT 29  ALKPHOS 72  BILITOT 0.2*  PROT 6.0*  ALBUMIN 2.8*   No results for input(s): LIPASE, AMYLASE in the last 168 hours. No results for input(s): AMMONIA in the last 168 hours. CBC:  Recent Labs Lab 04/20/15 1508  WBC 10.6*  NEUTROABS 6.5  HGB 12.7*  HCT 36.5*  MCV 88.0  PLT 327   Cardiac Enzymes: No results for input(s): CKTOTAL, CKMB, CKMBINDEX, TROPONINI in the last 168 hours. BNP (last 3 results) No results for input(s): BNP in the last 8760 hours.  ProBNP (last 3 results) No results for input(s): PROBNP in the last 8760 hours.  CBG:  Recent Labs Lab 04/22/15 1001 04/22/15 1513 04/22/15 2307 04/23/15 0642 04/23/15 1225  GLUCAP 130* 92 203* 183* 143*    Recent Results (from the past 240 hour(s))  Blood culture (routine single)     Status: None (Preliminary result)   Collection Time: 04/20/15  3:00 PM  Result Value Ref Range Status   Preliminary Report Blood Culture received; No Growth to date;  Preliminary   Preliminary Report Culture will be held for 5 days before issuing  Preliminary   Preliminary Report a Final Negative report.  Preliminary  Blood culture (routine single)     Status: None (Preliminary result)   Collection Time: 04/20/15  3:06 PM  Result Value Ref Range Status   Preliminary Report Blood Culture received; No Growth to date;  Preliminary   Preliminary Report Culture will be held for 5 days before issuing  Preliminary    Comment: Culture results may be compromised due to an excessive volume of blood received in culture bottles.    Preliminary Report a Final Negative report.  Preliminary     Studies: No results found.  Scheduled Meds: . aspirin EC  325 mg Oral BID  . ceFAZolin  2 g Intravenous 3 times per day  . docusate sodium  100 mg Oral BID  . losartan  100 mg Oral Daily   And  . hydrochlorothiazide  12.5 mg Oral Daily  . insulin aspart  0-5 Units Subcutaneous QHS  . insulin aspart  0-9 Units Subcutaneous TID WC  . metFORMIN  500 mg Oral BID  WC  . multivitamin with minerals  1 tablet Oral Daily  . nicotine  14 mg Transdermal Daily  . pantoprazole  40 mg Oral Q1200  . venlafaxine XR  75 mg Oral QPC supper   Continuous Infusions: . sodium chloride    . lactated ringers 10 mL/hr at 04/22/15 1034   Antibiotics Given (last 72 hours)    Date/Time Action Medication Dose Rate   04/22/15 1348 Given  [used in beads]   vancomycin (VANCOCIN) powder 500 mg    04/22/15 1349 Given  [used in beads]   gentamicin (GARAMYCIN) injection 160 mg    04/22/15 2207 Given   ceFAZolin (ANCEF) IVPB 2 g/50 mL premix 2 g 100 mL/hr   04/23/15 0540 Given   ceFAZolin (ANCEF) IVPB 2 g/50 mL premix 2 g 100 mL/hr   04/23/15 1348 Given   ceFAZolin (ANCEF) IVPB 2 g/50 mL premix 2 g 100 mL/hr      Active Problems:   Pyarthrosis ([place])    Diabetes ([place])    Time spent: 15 min    Adam Tyler  [facility] Pager [phone] If 7PM-7AM, please contact night-coverage at [email or web address], password [facility] 04/23/2015, 2:23 PM  LOS: 1 day

## 2015-04-23 NOTE — Brief Op Note (Signed)
04/22/2015  10:25 AM  PATIENT:  Adam Tyler  59 y.o. male  PRE-OPERATIVE DIAGNOSIS:  post op infection left knee  POST-OPERATIVE DIAGNOSIS:  post op infection left knee  PROCEDURE:  Procedure(s): LEFT KNEE ARTHROSCOPY WITH LAVAGE AND DRAINING WITH SYNOVECTOMY (Left)  SURGEON:  Surgeon(s) and Role:    * Earlie Server, MD - Primary  PHYSICIAN ASSISTANT:   ASSISTANTS: none   ANESTHESIA:   general  EBL:     BLOOD ADMINISTERED:none  DRAINS: none   LOCAL MEDICATIONS USED:  NONE  SPECIMEN:  No Specimen  DISPOSITION OF SPECIMEN:  N/A  COUNTS:  YES  TOURNIQUET:  * No tourniquets in log *  DICTATION: .Other Dictation: Dictation Number unknown  PLAN OF CARE: Admit to inpatient   PATIENT DISPOSITION:  PACU - hemodynamically stable.   Delay start of Pharmacological VTE agent (>24hrs) due to surgical blood loss or risk of bleeding: yes

## 2015-04-23 NOTE — Progress Notes (Signed)
Subjective: 1 Day Post-Op Procedure(s) (LRB): LEFT KNEE ARTHROSCOPY WITH LAVAGE AND DRAINING WITH SYNOVECTOMY (Left) Patient reports pain as moderate.    Objective: Vital signs in last 24 hours: Temp:  [97.9 F (36.6 C)-100.6 F (38.1 C)] 100.6 F (38.1 C) (11/05 0822) Pulse Rate:  [72-110] 106 (11/05 0822) Resp:  [10-22] 16 (11/05 0822) BP: (121-160)/(50-81) 150/74 mmHg (11/05 0822) SpO2:  [95 %-100 %] 95 % (11/05 0822)  Intake/Output from previous day: 11/04 0701 - 11/05 0700 In: 1200 [I.V.:1200] Out: -  Intake/Output this shift:     Recent Labs  04/20/15 1508  HGB 12.7*    Recent Labs  04/20/15 1508  WBC 10.6*  RBC 4.15*  HCT 36.5*  PLT 327    Recent Labs  04/20/15 1508 04/23/15 0453  NA 132* 132*  K 4.1 4.1  CL 97* 97*  CO2 25 26  BUN 15 16  CREATININE 0.86 0.78  GLUCOSE 142* 171*  CALCIUM 9.1 9.0   No results for input(s): LABPT, INR in the last 72 hours.  Neurovascular intact Sensation intact distally Intact pulses distally Dorsiflexion/Plantar flexion intact Incision: dressing C/D/I  Assessment/Plan: 1 Day Post-Op Procedure(s) (LRB): LEFT KNEE ARTHROSCOPY WITH LAVAGE AND DRAINING WITH SYNOVECTOMY (Left) Continue ABX therapy due to Post-op infection  Recheck cbc tomorrow AM Advance penrose drain tomorrow AM with dsg change, likely d/c home if vitals stable tomorrow ASA DVT proph Appreciate med assistance in DM management   Kaytlynne Neace 04/23/2015, 11:19 AM

## 2015-04-23 NOTE — Discharge Instructions (Signed)
Diet: As you were doing prior to hospitalization   Activity: Increase activity slowly as tolerated   Shower: May shower without a dressing on post op day #3, NO SOAKING in tub   Dressing: You may change your dressing on post op day #3.  Then change the dressing daily with sterile 4"x4"s gauze dressing   Weight Bearing: weight bearing as taught in physical therapy. Use a walker or  Crutches as instructed.   To prevent constipation: you may use a stool softener such as -  Colace ( over the counter) 100 mg by mouth twice a day  Drink plenty of fluids ( prune juice may be helpful) and high fiber foods  Miralax ( over the counter) for constipation as needed.   Precautions: If you experience chest pain or shortness of breath - call 911 immediately For transfer to the hospital emergency department!!  If you develop a fever greater that 101 F, purulent drainage from wound, increased redness or drainage from wound, or calf pain -- Call the office   Follow- Up Appointment: Please call for an appointment to be seen in 1 week  [place] - [phone]

## 2015-04-24 ENCOUNTER — Inpatient Hospital Stay (HOSPITAL_COMMUNITY): Payer: Commercial Managed Care - HMO

## 2015-04-24 DIAGNOSIS — M009 Pyogenic arthritis, unspecified: Secondary | ICD-10-CM

## 2015-04-24 LAB — COMPREHENSIVE METABOLIC PANEL
ALBUMIN: 2.6 g/dL — AB (ref 3.5–5.0)
ALK PHOS: 65 U/L (ref 38–126)
ALT: 17 U/L (ref 17–63)
ANION GAP: 12 (ref 5–15)
AST: 22 U/L (ref 15–41)
BILIRUBIN TOTAL: 0.8 mg/dL (ref 0.3–1.2)
BUN: 16 mg/dL (ref 6–20)
CALCIUM: 9.6 mg/dL (ref 8.9–10.3)
CO2: 30 mmol/L (ref 22–32)
CREATININE: 0.67 mg/dL (ref 0.61–1.24)
Chloride: 92 mmol/L — ABNORMAL LOW (ref 101–111)
GFR calc Af Amer: >60 mL/min (ref >60)
GFR calc non Af Amer: >60 mL/min (ref >60)
GLUCOSE: 125 mg/dL — AB (ref 65–99)
Potassium: 4 mmol/L (ref 3.5–5.1)
Sodium: 134 mmol/L — ABNORMAL LOW (ref 135–145)
TOTAL PROTEIN: 6.2 g/dL — AB (ref 6.5–8.1)

## 2015-04-24 LAB — CBC WITH DIFFERENTIAL/PLATELET
BASOS PCT: 0 %
Basophils Absolute: 0 10*3/uL (ref 0.0–0.1)
EOS PCT: 1 %
Eosinophils Absolute: 0.1 10*3/uL (ref 0.0–0.7)
HEMATOCRIT: 32.9 % — AB (ref 39.0–52.0)
Hemoglobin: 11.3 g/dL — ABNORMAL LOW (ref 13.0–17.0)
Lymphocytes Relative: 12 %
Lymphs Abs: 1.7 10*3/uL (ref 0.7–4.0)
MCH: 31.2 pg (ref 26.0–34.0)
MCHC: 34.3 g/dL (ref 30.0–36.0)
MCV: 90.9 fL (ref 78.0–100.0)
MONO ABS: 2 10*3/uL — AB (ref 0.1–1.0)
MONOS PCT: 14 %
NEUTROS ABS: 10.7 10*3/uL — AB (ref 1.7–7.7)
Neutrophils Relative %: 73 %
PLATELETS: 302 10*3/uL (ref 150–400)
RBC: 3.62 MIL/uL — ABNORMAL LOW (ref 4.22–5.81)
RDW: 12.7 % (ref 11.5–15.5)
WBC: 14.5 10*3/uL — ABNORMAL HIGH (ref 4.0–10.5)

## 2015-04-24 LAB — GLUCOSE, CAPILLARY
GLUCOSE-CAPILLARY: 122 mg/dL — AB (ref 65–99)
GLUCOSE-CAPILLARY: 153 mg/dL — AB (ref 65–99)
Glucose-Capillary: 162 mg/dL — ABNORMAL HIGH (ref 65–99)

## 2015-04-24 MED ORDER — SODIUM CHLORIDE 0.9 % IJ SOLN: 10.0000 mL | INTRAMUSCULAR | Status: DC | PRN

## 2015-04-24 NOTE — Progress Notes (Signed)
Patient ID: Adam Tyler, male   DOB: [birth year], 59 y.o.   MRN: [id number]         [facility] for Infectious Disease    Date of Admission:  04/22/2015           Day 37 cefazolin  Active Problems:   Pyarthrosis ([place])   Diabetes ([place])   . aspirin EC  325 mg Oral BID  . ceFAZolin  2 g Intravenous 3 times per day  . docusate sodium  100 mg Oral BID  . feeding supplement (PRO-STAT SUGAR FREE 64)  30 mL Oral BID  . losartan  100 mg Oral Daily   And  . hydrochlorothiazide  12.5 mg Oral Daily  . insulin aspart  0-5 Units Subcutaneous QHS  . insulin aspart  0-9 Units Subcutaneous TID WC  . metFORMIN  500 mg Oral BID WC  . multivitamin with minerals  1 tablet Oral Daily  . nicotine  14 mg Transdermal Daily  . pantoprazole  40 mg Oral Q1200  . venlafaxine XR  75 mg Oral QPC supper    SUBJECTIVE: Adam Tyler  underwent arthroscopy of his left knee on 03/08/2015. He developed postoperative infection with MSSA and underwent arthroscopic washout on 03/18/2015. He was discharged on IV cefazolin. He did not have any problems tolerating his cefazolin and did not miss any doses. However he did not have any improvement in his left knee pain or swelling. He was not aware of having any fever at home but was having problems with chills for the past 2 weeks. He saw my partner, Dr. Baxter Flattery in clinic on 04/20/2015. His white blood cell count was up and his sedimentation rate was 49 and his C-reactive protein was 10.5. Blood cultures were obtained and are negative to date. He was seen in the office by Dr. French Ana on 04/21/2015. Synovial fluid was aspirated. No organisms were seen on stain and those cultures are negative to date. He was readmitted and underwent arthroscopic washout yesterday. An MRI of his left knee was just performed.  Review of Systems: Pertinent items are noted in HPI.  Past Medical History  Diagnosis Date  . GERD (gastroesophageal reflux disease)   . Arthritis   . Hypertension   .  Upper respiratory infection 05/17/13    no fever since 05/18/13  . Cancer ([place]) 2007    melanoma forehead   . Anxiety     h/o panic attacks from " stress"  . Sciatica of left side     per pt  "buldging disc- lumbar"  . Acute medial meniscal injury of knee     left  . Arthritis, septic, knee ([place])   . Infection     left knee  . Sleep apnea     pt denies  . Blood glucose elevated   . Diabetes mellitus without complication ([place])     KGU5K 04/20/15 was 7.3     Social History  Substance Use Topics  . Smoking status: Current Every Day Smoker -- 0.50 packs/day for 20 years    Types: Cigarettes  . Smokeless tobacco: Never Used  . Alcohol Use: Yes     Comment: pt said all he can / 3-4 beers night    Family History  Problem Relation Age of Onset  . Cancer Mother     lymphnode  . Stroke Father    Allergies  Allergen Reactions  . Adhesive [Tape] Other (See Comments)    Leg raw and red, burning from  adhesive bandage 03/18/15  . Strawberry Extract Hives and Nausea And Vomiting    OBJECTIVE: Filed Vitals:   04/23/15 0822 04/23/15 1205 04/23/15 2221 04/24/15 0659  BP: 150/74 146/78 136/85 129/77  Pulse: 106 93 97 81  Temp: 100.6 F (38.1 C) 100.7 F (38.2 C) 101 F (38.3 C) 98.1 F (36.7 C)  TempSrc: Oral Oral Oral Oral  Resp: 16 16 16 16   Height:      Weight:      SpO2: 95% 96% 95% 95%   Body mass index is 28.84 kg/(m^2).  General: he is in good spirits visiting with his family Skin: right arm PICC site looks good Lungs: clear Cor: regular S1 and S2 with no murmur Left knee: Wrapped and in brace  Lab Results Lab Results  Component Value Date   WBC 14.5* 04/24/2015   HGB 11.3* 04/24/2015   HCT 32.9* 04/24/2015   MCV 90.9 04/24/2015   PLT 302 04/24/2015    Lab Results  Component Value Date   CREATININE 0.67 04/24/2015   BUN 16 04/24/2015   NA 134* 04/24/2015   K 4.0 04/24/2015   CL 92* 04/24/2015   CO2 30 04/24/2015    Lab Results  Component Value Date    ALT 17 04/24/2015   AST 22 04/24/2015   ALKPHOS 65 04/24/2015   BILITOT 0.8 04/24/2015     Microbiology: Recent Results (from the past 240 hour(s))  Blood culture (routine single)     Status: None (Preliminary result)   Collection Time: 04/20/15  3:00 PM  Result Value Ref Range Status   Preliminary Report Blood Culture received; No Growth to date;  Preliminary   Preliminary Report Culture will be held for 5 days before issuing  Preliminary   Preliminary Report a Final Negative report.  Preliminary  Blood culture (routine single)     Status: None (Preliminary result)   Collection Time: 04/20/15  3:06 PM  Result Value Ref Range Status   Preliminary Report Blood Culture received; No Growth to date;  Preliminary   Preliminary Report Culture will be held for 5 days before issuing  Preliminary    Comment: Culture results may be compromised due to an excessive volume of blood received in culture bottles.    Preliminary Report a Final Negative report.  Preliminary     ASSESSMENT: He remains unclear why he has not improved following arthroscopic washout and nearly 6 weeks of IV cefazolin. Recent blood and synovial fluid cultures remain negative. I will continue cefazolin for now.  PLAN: 1. Continue cefazolin 2. Await results of final cultures  Michel Bickers, MD [facility] for [place] [phone] pager   [phone] cell 04/24/2015, 2:09 PM

## 2015-04-24 NOTE — Progress Notes (Signed)
Subjective: 2 Days Post-Op Procedure(s) (LRB): LEFT KNEE ARTHROSCOPY WITH LAVAGE AND DRAINING WITH SYNOVECTOMY (Left) Patient reports pain as mild and moderate.  Tmax 101 last night WBC 14.5 this morning.    Objective: Vital signs in last 24 hours: Temp:  [98.1 F (36.7 C)-101 F (38.3 C)] 98.1 F (36.7 C) (11/06 0659) Pulse Rate:  [81-97] 81 (11/06 0659) Resp:  [16] 16 (11/06 0659) BP: (129-146)/(77-85) 129/77 mmHg (11/06 0659) SpO2:  [95 %-96 %] 95 % (11/06 0659)  Intake/Output from previous day: 11/05 0701 - 11/06 0700 In: 480 [P.O.:480] Out: -  Intake/Output this shift: Total I/O In: 120 [P.O.:120] Out: -    Recent Labs  04/24/15 0453  HGB 11.3*    Recent Labs  04/24/15 0453  WBC 14.5*  RBC 3.62*  HCT 32.9*  PLT 302    Recent Labs  04/23/15 0453 04/24/15 0453  NA 132* 134*  K 4.1 4.0  CL 97* 92*  CO2 26 30  BUN 16 16  CREATININE 0.78 0.67  GLUCOSE 171* 125*  CALCIUM 9.0 9.6   No results for input(s): LABPT, INR in the last 72 hours.  Neurovascular intact Sensation intact distally Intact pulses distally Dorsiflexion/Plantar flexion intact Incision: moderate drainage and penrose drain in place medial portal, anchor suture cut and drain advanced today, dsg changed No cellulitis present Compartment soft  Assessment/Plan: 2 Days Post-Op Procedure(s) (LRB): LEFT KNEE ARTHROSCOPY WITH LAVAGE AND DRAINING WITH SYNOVECTOMY (Left) Continue ABX therapy due to Post-op infection  WBAT in knee immobilizer MRI Left knee r/o osteomyelitis  Consult ID  Possible d/c tomorrow  Appreciate med and ID assistance   Destinie Thornsberry, Vonna Kotyk 04/24/2015, 11:15 AM

## 2015-04-24 NOTE — Progress Notes (Signed)
Noted right single lumen PICC's dsg loose,changed dsg, noted site red. Notified primary RN, will notify MD for further instructions. Dollene Mallery,RN (IV team)

## 2015-04-25 ENCOUNTER — Telehealth: Payer: Self-pay | Admitting: *Deleted

## 2015-04-25 ENCOUNTER — Encounter (HOSPITAL_COMMUNITY): Payer: Self-pay | Admitting: Orthopedic Surgery

## 2015-04-25 DIAGNOSIS — M00069 Staphylococcal arthritis, unspecified knee: Secondary | ICD-10-CM

## 2015-04-25 DIAGNOSIS — E119 Type 2 diabetes mellitus without complications: Secondary | ICD-10-CM

## 2015-04-25 DIAGNOSIS — B9561 Methicillin susceptible Staphylococcus aureus infection as the cause of diseases classified elsewhere: Secondary | ICD-10-CM

## 2015-04-25 LAB — CBC WITH DIFFERENTIAL/PLATELET
BASOS PCT: 0 %
Basophils Absolute: 0 10*3/uL (ref 0.0–0.1)
EOS ABS: 0.3 10*3/uL (ref 0.0–0.7)
EOS PCT: 4 %
HCT: 31.6 % — ABNORMAL LOW (ref 39.0–52.0)
Hemoglobin: 10.6 g/dL — ABNORMAL LOW (ref 13.0–17.0)
Lymphocytes Relative: 19 %
Lymphs Abs: 1.5 10*3/uL (ref 0.7–4.0)
MCH: 30.5 pg (ref 26.0–34.0)
MCHC: 33.5 g/dL (ref 30.0–36.0)
MCV: 90.8 fL (ref 78.0–100.0)
MONO ABS: 0.9 10*3/uL (ref 0.1–1.0)
MONOS PCT: 12 %
Neutro Abs: 5 10*3/uL (ref 1.7–7.7)
Neutrophils Relative %: 65 %
Platelets: 284 10*3/uL (ref 150–400)
RBC: 3.48 MIL/uL — ABNORMAL LOW (ref 4.22–5.81)
RDW: 12.4 % (ref 11.5–15.5)
WBC: 7.7 10*3/uL (ref 4.0–10.5)

## 2015-04-25 LAB — COMPREHENSIVE METABOLIC PANEL
ALK PHOS: 65 U/L (ref 38–126)
ALT: 13 U/L — AB (ref 17–63)
AST: 22 U/L (ref 15–41)
Albumin: 2.4 g/dL — ABNORMAL LOW (ref 3.5–5.0)
Anion gap: 11 (ref 5–15)
BUN: 17 mg/dL (ref 6–20)
CALCIUM: 8.8 mg/dL — AB (ref 8.9–10.3)
CO2: 27 mmol/L (ref 22–32)
CREATININE: 0.76 mg/dL (ref 0.61–1.24)
Chloride: 93 mmol/L — ABNORMAL LOW (ref 101–111)
GFR calc non Af Amer: >60 mL/min (ref >60)
GLUCOSE: 172 mg/dL — AB (ref 65–99)
Potassium: 3.3 mmol/L — ABNORMAL LOW (ref 3.5–5.1)
SODIUM: 131 mmol/L — AB (ref 135–145)
Total Bilirubin: 0.4 mg/dL (ref 0.3–1.2)
Total Protein: 6.1 g/dL — ABNORMAL LOW (ref 6.5–8.1)

## 2015-04-25 LAB — GLUCOSE, CAPILLARY
GLUCOSE-CAPILLARY: 124 mg/dL — AB (ref 65–99)
GLUCOSE-CAPILLARY: 152 mg/dL — AB (ref 65–99)
Glucose-Capillary: 136 mg/dL — ABNORMAL HIGH (ref 65–99)

## 2015-04-25 MED ORDER — HYDROCODONE-ACETAMINOPHEN 7.5-325 MG PO TABS: 1.0000 | ORAL_TABLET | Freq: Four times a day (QID) | ORAL | Status: DC | PRN

## 2015-04-25 MED ORDER — ASPIRIN EC 325 MG PO TBEC: 325.0000 mg | DELAYED_RELEASE_TABLET | Freq: Every day | ORAL | Status: DC

## 2015-04-25 MED ORDER — CEFAZOLIN SODIUM-DEXTROSE 2-3 GM-% IV SOLR: 2.0000 g | Freq: Three times a day (TID) | INTRAVENOUS | Status: DC

## 2015-04-25 NOTE — Telephone Encounter (Signed)
Adam Tyler with [facility] called for stop date on IV antibiotics. Per Dr. Henreitta Leber hospital note, new stop date is 05/21/15. Adam Tyler

## 2015-04-25 NOTE — Telephone Encounter (Signed)
Yes, thanks

## 2015-04-25 NOTE — Progress Notes (Signed)
    [facility] for Infectious Disease   Reason for visit: Follow up on septic arthritis with MSSA  Interval History: he had a washout and MRI without signs of osteomyelitis.  There was a fluid collection noted in superior medical aspect of suprapatellar recess.  WBC now normalized.   Physical Exam: Constitutional:  Filed Vitals:   04/25/15 0632  BP: 118/68  Pulse: 78  Temp: 98 F (36.7 C)  Resp: 16   patient appears in NAD Eyes: anicteric HENT: anicteric Respiratory: Normal respiratory effort; CTA B Cardiovascular: RRR MS: leg wrapped  Review of Systems: Constitutional: negative for fevers and chills Gastrointestinal: negative for diarrhea  Lab Results  Component Value Date   WBC 7.7 04/25/2015   HGB 10.6* 04/25/2015   HCT 31.6* 04/25/2015   MCV 90.8 04/25/2015   PLT 284 04/25/2015    Lab Results  Component Value Date   CREATININE 0.76 04/25/2015   BUN 17 04/25/2015   NA 131* 04/25/2015   K 3.3* 04/25/2015   CL 93* 04/25/2015   CO2 27 04/25/2015    Lab Results  Component Value Date   ALT 13* 04/25/2015   AST 22 04/25/2015   ALKPHOS 65 04/25/2015     Microbiology: Recent Results (from the past 240 hour(s))  Blood culture (routine single)     Status: None (Preliminary result)   Collection Time: 04/20/15  3:00 PM  Result Value Ref Range Status   Preliminary Report Blood Culture received; No Growth to date;  Preliminary   Preliminary Report Culture will be held for 5 days before issuing  Preliminary   Preliminary Report a Final Negative report.  Preliminary  Blood culture (routine single)     Status: None (Preliminary result)   Collection Time: 04/20/15  3:06 PM  Result Value Ref Range Status   Preliminary Report Blood Culture received; No Growth to date;  Preliminary   Preliminary Report Culture will be held for 5 days before issuing  Preliminary    Comment: Culture results may be compromised due to an excessive volume of blood received in  culture bottles.    Preliminary Report a Final Negative report.  Preliminary    Impression:  1. Septic arthritis with MSSA requiring repeated washouts 2.  DM  Plan: 1.  Cefazolin for another 4 weeks at least through December 3rd. 2. He already has an appt on 11/10 with Dr. Baxter Flattery 3.  Weekly cbc, cmp to [facility]

## 2015-04-25 NOTE — Progress Notes (Signed)
[facility]  Patient Status: Active (receiving services up to time of hospitalization)  [facility] is providing the following services: RN  If patient discharges after hours, please call [phone].   Consepcion Hearing 04/25/2015, 10:49 AM

## 2015-04-25 NOTE — Progress Notes (Signed)
Initial Nutrition Assessment  DOCUMENTATION CODES:   Not applicable  INTERVENTION:  Continue 30 ml Prostat po BID, each supplement provides 100 kcal and 15 grams of protein.   Plans for discharge today.    NUTRITION DIAGNOSIS:   Increased nutrient needs related to wound healing as evidenced by estimated needs.  GOAL:   Patient will meet greater than or equal to 90% of their needs  MONITOR:   PO intake, Supplement acceptance, Weight trends, Labs, I & O's  REASON FOR ASSESSMENT:   Consult Wound healing  ASSESSMENT:   59 year old male with a past medical history significant for hypertension, gastroesophageal reflux disease, anxiety/depression and left septic knee arthritis; who has continue experiencing pain and swelling on his left knee after 2 arthroscopy with lavages and synovectomies; admitted for another arthroscopy. Currently on IV cefazolin as indicated by ID. Was found to have elevated CBG's and recent A1C of 7.3  PROCEDURE (11/5): LEFT KNEE ARTHROSCOPY WITH LAVAGE AND DRAINING WITH SYNOVECTOMY (Left)  Pt reports having a good appetite currently and PTA with no other difficulties. Meal completion has been varied from 50-100%, however most meals have been 100%. Pt reports weight has been stable. Pt currently has Prostat ordered and has been consuming them. Pt educated on the importance of increased protein needs for wound healing. Plans for pt to be discharged today.   Pt with no observed significant fat or muscle mass loss.   Labs and medications reviewed.   Diet Order:  Diet Carb Modified Fluid consistency:: Thin; Room service appropriate?: Yes  Skin:   (Incision on L leg)  Last BM:  11/4  Height:   Ht Readings from Last 1 Encounters:  04/22/15 5\' 10"  (1.778 m)    Weight:   Wt Readings from Last 1 Encounters:  04/22/15 201 lb (91.173 kg)    Ideal Body Weight:  75.45 kg  BMI:  Body mass index is 28.84 kg/(m^2).  Estimated Nutritional Needs:    Kcal:  2100-2300  Protein:  110-120 grams  Fluid:  2.1 - 2.3 L/day  EDUCATION NEEDS:   Education needs addressed  Corrin Parker, MS, RD, LDN Pager # [phone] After hours/ weekend pager # [phone]

## 2015-04-25 NOTE — Care Management Note (Signed)
Case Management Note  Patient Details  Name: Adam Tyler MRN: [id number] Date of Birth: 07/16/1955  Subjective/Objective:    59 yr old male admitted with left knee infection , [place] has received 6 weeks of IV antibiotics prior to this admission. 04/22/15 patient had  Left knee washout and synovectomy.    Action/Plan: Case manager spoke with patient and wife concerning [place] needs at discharge. Patient is active with [facility] and has no DME needs. Care will be resumed at discharge.  Expected Discharge Date:     04/25/15     Expected Discharge Plan:   Resumption of Home Health  In-House Referral:     Discharge planning Services     Post Acute Care Choice:    Choice offered to:     DME Arranged:   NA DME Agency:     HH Arranged:   RN [place] Agency:   [facility]  Status of Service:   Completed.  Medicare Important Message Given:    Date Medicare IM Given:    Medicare IM give by:    Date Additional Medicare IM Given:    Additional Medicare Important Message give by:     If discussed at [place] of Stay Meetings, dates discussed:    Additional Comments:  Adam Meeker, RN 04/25/2015, 10:52 AM

## 2015-04-25 NOTE — Discharge Summary (Signed)
PATIENT ID: Adam Tyler        MRN:  [id number]          DOB/AGE: 58/15/1957 / 58 y.o.    DISCHARGE SUMMARY  ADMISSION DATE:    04/22/2015 DISCHARGE DATE:   04/25/2015   ADMISSION DIAGNOSIS: post op infection left knee    DISCHARGE DIAGNOSIS:  post op infection left knee    ADDITIONAL DIAGNOSIS: Active Problems:   Pyarthrosis ([place])   Diabetes ([place])  Past Medical History  Diagnosis Date  . GERD (gastroesophageal reflux disease)   . Arthritis   . Hypertension   . Upper respiratory infection 05/17/13    no fever since 05/18/13  . Cancer ([place]) 2007    melanoma forehead   . Anxiety     h/o panic attacks from " stress"  . Sciatica of left side     per pt  "buldging disc- lumbar"  . Acute medial meniscal injury of knee     left  . Arthritis, septic, knee ([place])   . Infection     left knee  . Sleep apnea     pt denies  . Blood glucose elevated   . Diabetes mellitus without complication ([place])     PJA2N 04/20/15 was 7.3     PROCEDURE: Procedure(s): LEFT KNEE ARTHROSCOPY WITH LAVAGE AND DRAINING WITH SYNOVECTOMY Left on 04/22/2015  CONSULTS: triad hospitalist, infectious disease, nutritionist      HISTORY:  See H&P in chart  HOSPITAL COURSE:  Adam Tyler is a 59 y.o. admitted on 04/22/2015 and found to have a diagnosis of post op infection left knee.  After appropriate laboratory studies were obtained  they were taken to the operating room on 04/22/2015 and underwent  Procedure(s): LEFT KNEE ARTHROSCOPY WITH LAVAGE AND DRAINING WITH SYNOVECTOMY  Left.   They were given perioperative antibiotics:  Anti-infectives    Start     Dose/Rate Route Frequency Ordered Stop   04/22/15 2200  ceFAZolin (ANCEF) IVPB 2 g/50 mL premix     2 g 100 mL/hr over 30 Minutes Intravenous 3 times per day 04/22/15 1824     04/22/15 1349  gentamicin (GARAMYCIN) injection  Status:  Discontinued       As needed 04/22/15 1349 04/22/15 1444   04/22/15 1348  vancomycin (VANCOCIN) powder  Status:   Discontinued       As needed 04/22/15 1348 04/22/15 1444    .  Tolerated the procedure well.  Medicine consult confirmed diagnosis diabetes started on sliding scale and discussed discharge meds.  POD #1, allowed out of bed to a chair. IV saline locked.  O2 discontionued.  Consult nutritionist   POD #2, advanced penrose drain, continued iv abx, mri r/o osteomyelitis negative .    The patient was discharged on 3 Days Post-Op in  Stable condition.  Blood products given:none  DIAGNOSTIC STUDIES: Recent vital signs: Patient Vitals for the past 24 hrs:  BP Temp Temp src Pulse Resp SpO2  04/25/15 0632 118/68 mmHg 98 F (36.7 C) Oral 78 16 96 %  04/24/15 2221 120/80 mmHg 98.3 F (36.8 C) Oral 83 16 97 %       Recent laboratory studies:  Recent Labs  04/20/15 1508 04/24/15 0453 04/25/15 0653  WBC 10.6* 14.5* 7.7  HGB 12.7* 11.3* 10.6*  HCT 36.5* 32.9* 31.6*  PLT 327 302 284    Recent Labs  04/20/15 1508 04/23/15 0453 04/24/15 0453 04/25/15 0653  NA 132* 132* 134* 131*  K 4.1 4.1 4.0 3.3*  CL 97* 97* 92* 93*  CO2 25 26 30 27   BUN 15 16 16 17   CREATININE 0.86 0.78 0.67 0.76  GLUCOSE 142* 171* 125* 172*  CALCIUM 9.1 9.0 9.6 8.8*   No results found for: INR, PROTIME   Recent Radiographic Studies :  Mr Knee Left  Wo Contrast  04/24/2015  CLINICAL DATA:  Pyarthrosis. Two days post left knee arthroscopy with lavage and synovectomy due to postoperative infection. EXAM: MRI OF THE LEFT KNEE WITHOUT CONTRAST TECHNIQUE: Multiplanar, multisequence MR imaging of the knee was performed. No intravenous contrast was administered. COMPARISON:  02/25/2015 FINDINGS: MENISCI Medial meniscus: Previous partial medial meniscectomy with resection of portions of the midbody and posterior horn. Lateral meniscus:  Normal. LIGAMENTS Cruciates:  Normal. Collaterals:  Normal. CARTILAGE Patellofemoral: Small focal areas of grade 4 chondromalacia, unchanged. Medial: New extensive full-thickness  cartilage loss on the femoral condyle and tibial plateau. Thinning of the articular cortex with subcortical edema in the femoral condyle. Lateral:  New subcortical edema of the femoral condyle. Joint: Antibiotic beads and soft tissue drain are in place. Moderate effusion with fluid and debris filling a Baker's cyst as well as a cyst along the popliteus tendon extending into the popliteus muscle. Focal fluid pocket high in the medial aspect of the suprapatellar recess. Edema in the vastus medialis and lateralis muscles. Popliteal Fossa: Baker's cyst and popliteus tendon ganglion cyst as described above. Extensor Mechanism:  Normal. Bones: New subcortical edema of the medial and lateral femoral condyles. This is most likely due to the recent debridement rather than the osteomyelitis. IMPRESSION: 1. Thick fluid fills the 8 x 2 x 2 cm Baker's cyst as well as the ganglion cyst along the popliteus tendon. There is also a focal fluid collection in the superior medial aspect of the suprapatellar recess. These fluid collections could represent pus. 2. Subcortical edema in the femoral condyles is new, most likely due to the recent debridement. 3. Edema throughout the joint felt to be secondary to the synovectomy and recent infection. Electronically Signed   By: Lorriane Shire M.D.   On: 04/24/2015 14:16   Ir Fluoro Guide Cv Midline Picc Right  04/18/2015  CLINICAL DATA:  59 year old with bleeding from PICC line due to broken hub. Patient needs a line exchange. EXAM: EXCHANGE OF PICC LINE WITH FLUOROSCOPY FLUOROSCOPY TIME:  12 seconds, and 12.4 mGy PROCEDURE: The procedure was explained to the patient. The risks and benefits of the procedure were discussed and the patient's questions were addressed. Informed consent was obtained from the patient. The right arm was prepped and draped in sterile fashion. Maximal barrier sterile technique was utilized including caps, mask, sterile gowns, sterile gloves, sterile drape, hand  hygiene and skin antiseptic. The catheter was pulled back and cut. A 0.018 wire was placed. A peel-away sheath was placed. A new 41 cm single lumen Power PICC line was placed through the peel-away sheath. The catheter tip was placed at the junction of the SVC and right atrium. Lumen aspirated and flushed well. Catheter secured to the skin. Fluoroscopic images were taken and saved for this procedure. Catheter length: 41 cm Complications: None Estimated blood loss: Minimal FINDINGS: Catheter tip at the junction of the SVC and right atrium. IMPRESSION: Successful replacement of the right arm PICC line with fluoroscopy. Electronically Signed   By: Markus Daft M.D.   On: 04/18/2015 15:44    DISCHARGE INSTRUCTIONS:   DISCHARGE MEDICATIONS:     Medication  List    TAKE these medications        aspirin EC 325 MG tablet  Take 1 tablet (325 mg total) by mouth daily.     ceFAZolin 2-3 GM-% Solr  Commonly known as:  ANCEF  Inject 50 mLs (2 g total) into the vein every 8 (eight) hours.     losartan-hydrochlorothiazide 100-12.5 MG tablet  Commonly known as:  HYZAAR  Take 1 tablet by mouth daily.     meloxicam 15 MG tablet  Commonly known as:  MOBIC  Take 15 mg by mouth daily. with food     metFORMIN 500 MG tablet  Commonly known as:  GLUCOPHAGE  Take 1 tablet (500 mg total) by mouth 2 (two) times daily with a meal.     venlafaxine XR 75 MG 24 hr capsule  Commonly known as:  EFFEXOR-XR  Take 75 mg by mouth daily after supper.        FOLLOW UP VISIT:       Follow-up Information    Follow up with [facility].   Why:  [place] will resume with [place]. Someone will contact you to arrange visit.   Contact information:   [address] [place]  [address] [phone]       Follow up with CAFFREY JR,W D, MD. Schedule an appointment as soon as possible for a visit in 1 day.   Specialty:  Orthopedic Surgery   Contact information:   [place] [address] [phone]       Schedule an appointment as soon as possible for a visit with [place], Luane School, NP.   Why:  new diagnosis diabetes management   Contact information:   [facility] [place] [address] [phone]       DISPOSITION:   Home  CONDITION:  Stable   Chriss Czar, PA-C  04/25/2015 12:18 PM

## 2015-04-26 LAB — CULTURE, BLOOD (SINGLE)
ORGANISM ID, BACTERIA: NO GROWTH
ORGANISM ID, BACTERIA: NO GROWTH

## 2015-04-28 ENCOUNTER — Encounter: Payer: Self-pay | Admitting: Internal Medicine

## 2015-04-28 ENCOUNTER — Telehealth: Payer: Self-pay | Admitting: *Deleted

## 2015-04-28 ENCOUNTER — Ambulatory Visit (INDEPENDENT_AMBULATORY_CARE_PROVIDER_SITE_OTHER): Payer: Commercial Managed Care - HMO | Admitting: Internal Medicine

## 2015-04-28 VITALS — BP 144/94 | HR 99 | Temp 98.6°F | Ht 70.0 in | Wt 201.5 lb

## 2015-04-28 DIAGNOSIS — Z23 Encounter for immunization: Secondary | ICD-10-CM

## 2015-04-28 DIAGNOSIS — M00062 Staphylococcal arthritis, left knee: Secondary | ICD-10-CM | POA: Diagnosis not present

## 2015-04-28 NOTE — Progress Notes (Signed)
Rfv: left knee MSSA septic arthritis Subjective:    Patient ID: Adam Tyler, male    DOB: 1955-08-12, 59 y.o.   MRN: [id number]  HPI 59yo M with MSSA left knee septic arthritis, seen last week and concern that he still had ongoing symptoms despite 4 wk of IV therapy. He was admitted on 11/2 for repeat wash out. Cx were negative though he did have abn arthrocentesis from Dr. Alvester Morin office that suggested ongoing infection. He is now feeling better, less fever or chills, less swelling of knee. Still having pain with weight bearing.  Allergies  Allergen Reactions  . Adhesive [Tape] Other (See Comments)    Leg raw and red, burning from adhesive bandage 03/18/15  . Strawberry Extract Hives and Nausea And Vomiting   Current Outpatient Prescriptions on File Prior to Visit  Medication Sig Dispense Refill  . aspirin EC 325 MG tablet Take 1 tablet (325 mg total) by mouth daily. 30 tablet 0  . ceFAZolin (ANCEF) 2-3 GM-% SOLR Inject 50 mLs (2 g total) into the vein every 8 (eight) hours. 1 each 0  . ceFAZolin (ANCEF) 2-3 GM-% SOLR Inject 50 mLs (2 g total) into the vein every 8 (eight) hours. 45 each 0  . HYDROcodone-acetaminophen (NORCO) 7.5-325 MG tablet Take 1-2 tablets by mouth every 6 (six) hours as needed for moderate pain. 90 tablet 0  . losartan-hydrochlorothiazide (HYZAAR) 100-12.5 MG per tablet Take 1 tablet by mouth daily.    . meloxicam (MOBIC) 15 MG tablet Take 15 mg by mouth daily. with food  2  . metFORMIN (GLUCOPHAGE) 500 MG tablet Take 1 tablet (500 mg total) by mouth 2 (two) times daily with a meal. 60 tablet 0  . venlafaxine XR (EFFEXOR-XR) 75 MG 24 hr capsule Take 75 mg by mouth daily after supper.   6   No current facility-administered medications on file prior to visit.   Active Ambulatory Problems    Diagnosis Date Noted  . Cervical mass, right posterior chain 04/29/2013  . Wound infection after surgery 03/19/2015  . Staphylococcal arthritis of left knee ([place])   .  Staphylococcus aureus infection   . Smoker   . Screen for STD (sexually transmitted disease)   . Heavy alcohol use   . Elevated liver function tests   . Pyarthrosis ([place]) 04/22/2015  . Diabetes ([place]) 04/23/2015   Resolved Ambulatory Problems    Diagnosis Date Noted  . No Resolved Ambulatory Problems   Past Medical History  Diagnosis Date  . GERD (gastroesophageal reflux disease)   . Arthritis   . Hypertension   . Upper respiratory infection 05/17/13  . Cancer ([place]) 2007  . Anxiety   . Sciatica of left side   . Acute medial meniscal injury of knee   . Arthritis, septic, knee ([place])   . Infection   . Sleep apnea   . Blood glucose elevated   . Diabetes mellitus without complication [facility])    Social History  Substance Use Topics  . Smoking status: Current Every Day Smoker -- 0.20 packs/day for 20 years    Types: Cigarettes  . Smokeless tobacco: Never Used  . Alcohol Use: 4.2 oz/week    7 Standard drinks or equivalent per week     Comment: pt said all he can / 3-4 beers night, only a beer a night now ( 04/28/15)  family history includes Cancer in his mother; Stroke in his father.  Review of Systems     Objective:   Physical  Exam BP 144/94 mmHg  Pulse 99  Temp(Src) 98.6 F (37 C) (Oral)  Ht 5\' 10"  (1.778 m)  Wt 201 lb 8 oz (91.4 kg)  BMI 28.91 kg/m2 Physical Exam  Constitutional: He is oriented to person, place, and time. He appears well-developed and well-nourished. No distress.  HENT:  Mouth/Throat: Oropharynx is clear and moist. No oropharyngeal exudate.  Cardiovascular: Normal rate, regular rhythm and normal heart sounds. Exam reveals no gallop and no friction rub.  No murmur heard.  Pulmonary/Chest: Effort normal and breath sounds normal. No respiratory distress. He has no wheezes.  Skin: Skin is warm and dry. No rash noted. No erythema. Right arm picc line is c/d/i Ext: left knee is wrapped Psychiatric: He has a normal mood and affect. His behavior is normal.            Assessment & Plan:  MSSA septic arthritis = we will get copy of Dr. caffrey's office note nad culture results from last week. Pt had repeat washout. Will ext abt til dec 18th ,restart 4-6wk of iv abtx for septic arthritis  Health maintenance =will get flu shot today  rtc in 4 wk

## 2015-04-28 NOTE — Telephone Encounter (Signed)
Dr Baxter Flattery requesting last office note and culture results.

## 2015-04-29 NOTE — Progress Notes (Signed)
Phone call to [place].  Verbal order from Dr. Baxter Flattery to continue IV antibiotics until 06/10/15 given to [facility] Pharmacist and verbalized back.

## 2015-05-03 ENCOUNTER — Ambulatory Visit (HOSPITAL_COMMUNITY)
Admission: RE | Admit: 2015-05-03 | Discharge: 2015-05-03 | Disposition: A | Discharge status: Home / Self Care | Payer: Commercial Managed Care - HMO | Source: Ambulatory Visit | Attending: Cardiovascular Disease | Admitting: Cardiovascular Disease

## 2015-05-03 ENCOUNTER — Other Ambulatory Visit (HOSPITAL_COMMUNITY): Payer: Self-pay | Admitting: Orthopedic Surgery

## 2015-05-03 DIAGNOSIS — I10 Essential (primary) hypertension: Secondary | ICD-10-CM | POA: Insufficient documentation

## 2015-05-03 DIAGNOSIS — M79605 Pain in left leg: Secondary | ICD-10-CM | POA: Diagnosis not present

## 2015-05-03 DIAGNOSIS — M7989 Other specified soft tissue disorders: Secondary | ICD-10-CM

## 2015-05-03 DIAGNOSIS — E119 Type 2 diabetes mellitus without complications: Secondary | ICD-10-CM | POA: Insufficient documentation

## 2015-05-30 ENCOUNTER — Telehealth: Payer: Self-pay | Admitting: *Deleted

## 2015-05-30 ENCOUNTER — Encounter: Payer: Self-pay | Admitting: Internal Medicine

## 2015-05-30 ENCOUNTER — Ambulatory Visit (INDEPENDENT_AMBULATORY_CARE_PROVIDER_SITE_OTHER): Payer: Commercial Managed Care - HMO | Admitting: Internal Medicine

## 2015-05-30 VITALS — BP 169/96 | HR 111 | Temp 97.8°F | Wt 206.0 lb

## 2015-05-30 DIAGNOSIS — A4901 Methicillin susceptible Staphylococcus aureus infection, unspecified site: Secondary | ICD-10-CM

## 2015-05-30 DIAGNOSIS — M00062 Staphylococcal arthritis, left knee: Secondary | ICD-10-CM | POA: Diagnosis not present

## 2015-05-30 DIAGNOSIS — M25562 Pain in left knee: Secondary | ICD-10-CM

## 2015-05-30 NOTE — Telephone Encounter (Signed)
Per Dr Baxter Flattery called [facility] and advised them D/C for antibiotics 06/09/15 and can pull the PICC 06/10/15.

## 2015-05-30 NOTE — Progress Notes (Signed)
RFV: septic arthritis Subjective:    Patient ID: Adam Tyler, male    DOB: 09-Oct-1955, 59 y.o.   MRN: [id number]  HPI  59yo M with MSSA septic arthritis to left knee, most recent synovectomy and I x D was on 11/2. In total he has had 4 surgeries. He was discharged on 6 wk of IV abtx to stop on 12//23. Last OR cx were NGTD. His labs have normalized. crp 2.9  and sed rate of 3 where as sed rate of 49 on 11/2. He states that knee is not swelling though occasionally warm at end of the day. Still has some stabbing pain to knee especially with weight bearing and physical therapy  He had to have picc line replaced due to migration. No pain at picc line site in right upper arm  Allergies  Allergen Reactions  . Adhesive [Tape] Other (See Comments)    Leg raw and red, burning from adhesive bandage 03/18/15  . Strawberry Extract Hives and Nausea And Vomiting    Current outpatient prescriptions:  .  aspirin EC 325 MG tablet, Take 1 tablet (325 mg total) by mouth daily., Disp: 30 tablet, Rfl: 0 .  ceFAZolin (ANCEF) 2-3 GM-% SOLR, Inject 50 mLs (2 g total) into the vein every 8 (eight) hours., Disp: 45 each, Rfl: 0 .  HYDROcodone-acetaminophen (NORCO) 7.5-325 MG tablet, Take 1-2 tablets by mouth every 6 (six) hours as needed for moderate pain., Disp: 90 tablet, Rfl: 0 .  losartan-hydrochlorothiazide (HYZAAR) 100-12.5 MG per tablet, Take 1 tablet by mouth daily., Disp: , Rfl:  .  meloxicam (MOBIC) 15 MG tablet, Take 15 mg by mouth daily. with food, Disp: , Rfl: 2 .  metFORMIN (GLUCOPHAGE) 500 MG tablet, Take 1 tablet (500 mg total) by mouth 2 (two) times daily with a meal., Disp: 60 tablet, Rfl: 0 .  venlafaxine XR (EFFEXOR-XR) 75 MG 24 hr capsule, Take 75 mg by mouth daily after supper. , Disp: , Rfl: 6  Review of Systems Review of Systems  Constitutional: Negative for fever, chills, diaphoresis, activity change, appetite change, fatigue and unexpected weight change.  HENT: Negative for congestion,  sore throat, rhinorrhea, sneezing, trouble swallowing and sinus pressure.  Eyes: Negative for photophobia and visual disturbance.  Respiratory: Negative for cough, chest tightness, shortness of breath, wheezing and stridor.  Cardiovascular: Negative for chest pain, palpitations and leg swelling.  Gastrointestinal: Negative for nausea, vomiting, abdominal pain, diarrhea, constipation, blood in stool, abdominal distention and anal bleeding.  Genitourinary: Negative for dysuria, hematuria, flank pain and difficulty urinating.  Musculoskeletal: positive + knee pain Skin: Negative for color change, pallor, rash and wound.  Neurological: Negative for dizziness, tremors, weakness and light-headedness.  Hematological: Negative for adenopathy. Does not bruise/bleed easily.  Psychiatric/Behavioral: Negative for behavioral problems, confusion, sleep disturbance, dysphoric mood, decreased concentration and agitation.       Objective:   Physical Exam BP 169/96 mmHg  Pulse 111  Temp(Src) 97.8 F (36.6 C) (Oral)  Wt 206 lb (93.441 kg) Physical Exam  Constitutional: He is oriented to person, place, and time. He appears well-developed and well-nourished. No distress.  HENT:  Mouth/Throat: Oropharynx is clear and moist. No oropharyngeal exudate.  Ext: left knee slightly warm to touch inferior aspect of knee. No swelling or tenderness. Trace edema to left ankle Neurological: He is alert and oriented to person, place, and time.  Skin: Skin is warm and dry. No rash noted. No erythema. picc line c/d/i Psychiatric: He has a normal mood  and affect. His behavior is normal.      Assessment & Plan:   MSSA septic arthritis = continue with cefazolin until 12/23. Will let advance pull picc line on 12/23. Plan to see back off of abtx in early January to see how he is doing.  Left knee pain = continue to treat as needed with opiates per Dr. Alvester Morin instruction. If it is increasing in either pain, swelling,  erythema, have him contact ortho  rtc in 3-4 wk

## 2015-06-28 ENCOUNTER — Encounter: Payer: Self-pay | Admitting: Internal Medicine

## 2015-06-28 ENCOUNTER — Ambulatory Visit (INDEPENDENT_AMBULATORY_CARE_PROVIDER_SITE_OTHER): Payer: Commercial Managed Care - HMO | Admitting: Internal Medicine

## 2015-06-28 VITALS — BP 137/88 | HR 87 | Temp 98.2°F | Ht 70.0 in | Wt 212.0 lb

## 2015-06-28 DIAGNOSIS — M00062 Staphylococcal arthritis, left knee: Secondary | ICD-10-CM | POA: Diagnosis not present

## 2015-06-28 LAB — CBC WITH DIFFERENTIAL/PLATELET
BASOS PCT: 1 % (ref 0–1)
Basophils Absolute: 0.1 10*3/uL (ref 0.0–0.1)
EOS ABS: 0.3 10*3/uL (ref 0.0–0.7)
Eosinophils Relative: 5 % (ref 0–5)
HCT: 46 % (ref 39.0–52.0)
Hemoglobin: 15.6 g/dL (ref 13.0–17.0)
Lymphocytes Relative: 26 % (ref 12–46)
Lymphs Abs: 1.7 10*3/uL (ref 0.7–4.0)
MCH: 30.1 pg (ref 26.0–34.0)
MCHC: 33.9 g/dL (ref 30.0–36.0)
MCV: 88.8 fL (ref 78.0–100.0)
MONO ABS: 0.9 10*3/uL (ref 0.1–1.0)
MONOS PCT: 13 % — AB (ref 3–12)
MPV: 10.9 fL (ref 8.6–12.4)
Neutro Abs: 3.7 10*3/uL (ref 1.7–7.7)
Neutrophils Relative %: 55 % (ref 43–77)
PLATELETS: 240 10*3/uL (ref 150–400)
RBC: 5.18 MIL/uL (ref 4.22–5.81)
RDW: 14.1 % (ref 11.5–15.5)
WBC: 6.7 10*3/uL (ref 4.0–10.5)

## 2015-06-28 LAB — SEDIMENTATION RATE: Sed Rate: 1 mm/hr (ref 0–20)

## 2015-06-28 LAB — C-REACTIVE PROTEIN: CRP: 0.7 mg/dL — AB (ref <0.60)

## 2015-06-28 NOTE — Progress Notes (Signed)
Rfv: follow up on MSSA septic arthritis Subjective:    Patient ID: Adam Tyler, male    DOB: 12-08-1955, 60 y.o.   MRN: [id number]  HPI  60yo M with MSSA septic arthritis to left knee, most recent synovectomy and I x D was on 11/2. In total he has had 4 surgeries. He was discharged on 6 wk of IV abtx to stop on 12//23. Last OR cx were NGTD. His labs have normalized. crp 2.9 and sed rate of 3 where as sed rate of 49 on 11/2. He finished his abtx just prior to new years. Has been off of abtx for hte past 2 wk. Still has mild swelling of left knee, little warmth, no erythema, predominately pain. He states that he takes 10mg  oxycodone which doesn't seem to impact level of pain. He is limiting his activity due to pain. He was falling a bit prior to the holidays. Now has a knee brace which helps. He reports that it is Unclear if getting tka sooner or later since has not decided with orthopedics.  Allergies  Allergen Reactions  . Adhesive [Tape] Other (See Comments)    Leg raw and red, burning from adhesive bandage 03/18/15  . Strawberry Extract Hives and Nausea And Vomiting   Current Outpatient Prescriptions on File Prior to Visit  Medication Sig Dispense Refill  . losartan-hydrochlorothiazide (HYZAAR) 100-12.5 MG per tablet Take 1 tablet by mouth daily.    . meloxicam (MOBIC) 15 MG tablet Take 15 mg by mouth daily. with food  2  . metFORMIN (GLUCOPHAGE) 500 MG tablet Take 1 tablet (500 mg total) by mouth 2 (two) times daily with a meal. 60 tablet 0  . venlafaxine XR (EFFEXOR-XR) 75 MG 24 hr capsule Take 75 mg by mouth daily after supper.   6   No current facility-administered medications on file prior to visit.   Active Ambulatory Problems    Diagnosis Date Noted  . Cervical mass, right posterior chain 04/29/2013  . Wound infection after surgery 03/19/2015  . Staphylococcal arthritis of left knee ([place])   . Staphylococcus aureus infection   . Smoker   . Screen for STD (sexually transmitted  disease)   . Heavy alcohol use   . Elevated liver function tests   . Pyarthrosis ([place]) 04/22/2015  . Diabetes ([place]) 04/23/2015   Resolved Ambulatory Problems    Diagnosis Date Noted  . No Resolved Ambulatory Problems   Past Medical History  Diagnosis Date  . GERD (gastroesophageal reflux disease)   . Arthritis   . Hypertension   . Upper respiratory infection 05/17/13  . Cancer ([place]) 2007  . Anxiety   . Sciatica of left side   . Acute medial meniscal injury of knee   . Arthritis, septic, knee ([place])   . Infection   . Sleep apnea   . Blood glucose elevated   . Diabetes mellitus without complication [facility])    Social History  Substance Use Topics  . Smoking status: Current Every Day Smoker -- 0.20 packs/day for 20 years    Types: Cigarettes  . Smokeless tobacco: Never Used  . Alcohol Use: 4.2 oz/week    7 Standard drinks or equivalent per week     Comment: pt said all he can / 3-4 beers night, only a beer a night now ( 04/28/15)    Review of Systems + knee pain. 10 point ros is negative    Objective:   Physical Exam BP 137/88 mmHg  Pulse 87  Temp(Src) 98.2 F (36.8 C) (Oral)  Ht 5\' 10"  (1.778 m)  Wt 212 lb (96.163 kg)  BMI 30.42 kg/m2 gen = a xo by 4 in NAD  Left knee = no warmth, mild swelling, no erythema, incisional scar inferior medial aspect of joint  Labs: Lab Results  Component Value Date   ESRSEDRATE 1 06/28/2015   Lab Results  Component Value Date   CRP 0.7* 06/28/2015       Assessment & Plan:  Hx of mssa septic arthritis = will check inflammatory markers to see if need to continue with chronic suppression. Since normalize. No need for furhter abtx  Left knee pain = will have him increase oxycodone to 15mg  q6hr.   Sees caffrey in 2 wk

## 2016-04-30 ENCOUNTER — Other Ambulatory Visit (HOSPITAL_COMMUNITY): Payer: Self-pay | Admitting: Orthopedic Surgery

## 2016-04-30 DIAGNOSIS — M25561 Pain in right knee: Secondary | ICD-10-CM

## 2016-04-30 DIAGNOSIS — S80912A Unspecified superficial injury of left knee, initial encounter: Principal | ICD-10-CM

## 2016-04-30 DIAGNOSIS — L089 Local infection of the skin and subcutaneous tissue, unspecified: Secondary | ICD-10-CM

## 2016-05-08 ENCOUNTER — Ambulatory Visit (HOSPITAL_COMMUNITY)
Admission: RE | Admit: 2016-05-08 | Discharge: 2016-05-08 | Disposition: A | Discharge status: Home / Self Care | Payer: Commercial Managed Care - HMO | Source: Ambulatory Visit | Attending: Orthopedic Surgery | Admitting: Orthopedic Surgery

## 2016-05-08 DIAGNOSIS — S83241A Other tear of medial meniscus, current injury, right knee, initial encounter: Secondary | ICD-10-CM | POA: Insufficient documentation

## 2016-05-08 DIAGNOSIS — M7121 Synovial cyst of popliteal space [Baker], right knee: Secondary | ICD-10-CM | POA: Insufficient documentation

## 2016-05-08 DIAGNOSIS — M65861 Other synovitis and tenosynovitis, right lower leg: Secondary | ICD-10-CM | POA: Diagnosis not present

## 2016-05-08 DIAGNOSIS — M25461 Effusion, right knee: Secondary | ICD-10-CM | POA: Diagnosis not present

## 2016-05-08 DIAGNOSIS — M25561 Pain in right knee: Secondary | ICD-10-CM | POA: Insufficient documentation

## 2016-05-08 DIAGNOSIS — G8929 Other chronic pain: Secondary | ICD-10-CM | POA: Insufficient documentation

## 2016-05-08 DIAGNOSIS — L089 Local infection of the skin and subcutaneous tissue, unspecified: Secondary | ICD-10-CM

## 2016-05-08 DIAGNOSIS — M1711 Unilateral primary osteoarthritis, right knee: Secondary | ICD-10-CM | POA: Insufficient documentation

## 2016-05-08 DIAGNOSIS — S80912A Unspecified superficial injury of left knee, initial encounter: Principal | ICD-10-CM

## 2016-05-08 DIAGNOSIS — X58XXXA Exposure to other specified factors, initial encounter: Secondary | ICD-10-CM | POA: Diagnosis not present

## 2016-06-19 ENCOUNTER — Telehealth: Payer: Self-pay

## 2016-06-19 NOTE — Telephone Encounter (Signed)
Patient states he has been fatigued since being diagnosed with staph infection.  He has been to his primary care physician several times and they are not able to figure out what could be wrong. He is requesting visit with Dr Baxter Flattery whose first available appointment is February.    Appointment scheduled for February but patient wanted me to pass this information on to Dr Baxter Flattery just in case she wanted to she him earlier or suggest options for treatment with primary care physician .   Laverle Patter, RN

## 2016-07-23 ENCOUNTER — Encounter: Payer: Self-pay | Admitting: Internal Medicine

## 2016-07-23 ENCOUNTER — Ambulatory Visit
Admission: RE | Admit: 2016-07-23 | Discharge: 2016-07-23 | Disposition: A | Discharge status: Home / Self Care | Payer: Commercial Managed Care - HMO | Source: Ambulatory Visit | Attending: Internal Medicine | Admitting: Internal Medicine

## 2016-07-23 ENCOUNTER — Ambulatory Visit (INDEPENDENT_AMBULATORY_CARE_PROVIDER_SITE_OTHER): Payer: Commercial Managed Care - HMO | Admitting: Internal Medicine

## 2016-07-23 VITALS — BP 159/85 | HR 80 | Temp 98.0°F | Ht 70.0 in | Wt 213.0 lb

## 2016-07-23 DIAGNOSIS — Z720 Tobacco use: Secondary | ICD-10-CM

## 2016-07-23 DIAGNOSIS — R590 Localized enlarged lymph nodes: Secondary | ICD-10-CM

## 2016-07-23 DIAGNOSIS — F1020 Alcohol dependence, uncomplicated: Secondary | ICD-10-CM | POA: Diagnosis not present

## 2016-07-23 DIAGNOSIS — R5383 Other fatigue: Secondary | ICD-10-CM | POA: Diagnosis not present

## 2016-07-23 DIAGNOSIS — E559 Vitamin D deficiency, unspecified: Secondary | ICD-10-CM | POA: Diagnosis not present

## 2016-07-23 LAB — CBC WITH DIFFERENTIAL/PLATELET
Basophils Absolute: 77 cells/uL (ref 0–200)
Basophils Relative: 1 %
EOS ABS: 308 {cells}/uL (ref 15–500)
Eosinophils Relative: 4 %
HCT: 42.6 % (ref 38.5–50.0)
HEMOGLOBIN: 14.7 g/dL (ref 13.2–17.1)
LYMPHS ABS: 2618 {cells}/uL (ref 850–3900)
Lymphocytes Relative: 34 %
MCH: 31.3 pg (ref 27.0–33.0)
MCHC: 34.5 g/dL (ref 32.0–36.0)
MCV: 90.6 fL (ref 80.0–100.0)
MONOS PCT: 11 %
MPV: 10.5 fL (ref 7.5–12.5)
Monocytes Absolute: 847 cells/uL (ref 200–950)
NEUTROS PCT: 50 %
Neutro Abs: 3850 cells/uL (ref 1500–7800)
Platelets: 249 10*3/uL (ref 140–400)
RBC: 4.7 MIL/uL (ref 4.20–5.80)
RDW: 14.4 % (ref 11.0–15.0)
WBC: 7.7 10*3/uL (ref 3.8–10.8)

## 2016-07-23 NOTE — Progress Notes (Signed)
RFV: fatigue  Patient ID: Adam Tyler, male   DOB: Oct 19, 1955, 61 y.o.   MRN: [id number]  HPI Mr Luisi was last seen in the Jan 2017 for follow up on MSSA left knee septic arthritis.  Has ongoing fatigue and occ nausea. He states that his fatigue is so severe that he often sleep all day, 12-16 hr stretches - wakes up feeling fine. He denies depression, though he has fatigue, excessive sleeping. Weight stable. No fever, chills, nightsweats. They went to see PCP looked at some labs - including vitamin D, and testosterone - stated that they were low.  Enjoys working on his house 2 days per week. Otherwise, quite exhausted   EGD= negative in the last year CSY = negative in the last year   He retired from work in [place] 2017. At times he had anxiety which presented itself with dysphagia and vertigo/light headness. The former has resolved. Nothing noted on EGD as well.  Vertiginous feelings still present  Still drinks roughly 3-4 cans of beer, miller light. Dr Collene Mares also mentioned that he needs to quite drinking, last year has mild transaminitis attributed to alcohol use  Longstanding smoker, smokes 1/4 pack per day  Outpatient Encounter Prescriptions as of 07/23/2016  Medication Sig  . losartan-hydrochlorothiazide (HYZAAR) 100-12.5 MG per tablet Take 1 tablet by mouth daily.  . metFORMIN (GLUCOPHAGE) 500 MG tablet Take 1 tablet (500 mg total) by mouth 2 (two) times daily with a meal.  . venlafaxine XR (EFFEXOR-XR) 75 MG 24 hr capsule Take 75 mg by mouth daily after supper.   . [DISCONTINUED] amLODipine (NORVASC) 10 MG tablet TAKE 1 TABLET(S) EVERY DAY BY ORAL ROUTE FOR 90 DAYS.  . [DISCONTINUED] meloxicam (MOBIC) 15 MG tablet Take 15 mg by mouth daily. with food  . [DISCONTINUED] OXYCODONE 10 MG 12 hr tablet   . [DISCONTINUED] oxyCODONE-acetaminophen (PERCOCET/ROXICET) 5-325 MG tablet TAKE 1 TO 2 TABLETS BY MOUTH EVERY 4-6 HOURS AS NEEDED FOR PAIN   No facility-administered encounter  medications on file as of 07/23/2016.      Patient Active Problem List   Diagnosis Date Noted  . Diabetes ([place]) 04/23/2015  . Pyarthrosis ([place]) 04/22/2015  . Heavy alcohol use   . Elevated liver function tests   . Wound infection after surgery 03/19/2015  . Staphylococcal arthritis of left knee ([place])   . Staphylococcus aureus infection   . Smoker   . Screen for STD (sexually transmitted disease)   . Cervical mass, right posterior chain 04/29/2013     Health Maintenance Due  Topic Date Due  . PNEUMOCOCCAL POLYSACCHARIDE VACCINE (1) 08/07/1958  . FOOT EXAM  08/07/1966  . OPHTHALMOLOGY EXAM  08/07/1966  . TETANUS/TDAP  08/08/1975  . COLONOSCOPY  08/07/2006  . HEMOGLOBIN A1C  10/18/2015  . INFLUENZA VACCINE  01/17/2016    Social History  Substance Use Topics  . Smoking status: Current Every Day Smoker    Packs/day: 0.20    Years: 20.00    Types: Cigarettes  . Smokeless tobacco: Never Used  . Alcohol use 4.2 oz/week    7 Standard drinks or equivalent per week     Comment: pt said all he can / 3-4 beers night, only a beer a night now ( 04/28/15)   1 pack list 5 days. -smoking  family history includes Cancer in his mother; Stroke in his father.  Review of Systems Review of Systems  Constitutional: + fatigue. Decrease activity. Negative for fever, chills, diaphoresis,  appetite change,  and unexpected weight change.  HENT: Negative for congestion, sore throat, rhinorrhea, sneezing, trouble swallowing and sinus pressure.  Eyes: Negative for photophobia and visual disturbance.  Respiratory: Negative for cough, chest tightness, shortness of breath, wheezing and stridor.  Cardiovascular: Negative for chest pain, palpitations and leg swelling.  Gastrointestinal:+ GERD. Negative for nausea, vomiting, abdominal pain, diarrhea, constipation, blood in stool, abdominal distention and anal bleeding.  Genitourinary: Negative for dysuria, hematuria, flank pain and difficulty urinating.    Musculoskeletal: + left knee. Negative for myalgias, back pain, joint swelling, arthralgias and gait problem.  Skin: Negative for color change, pallor, rash and wound.  Neurological: Negative for dizziness, tremors, weakness and light-headedness.  Hematological: Negative for adenopathy. Does not bruise/bleed easily.  Psychiatric/Behavioral: Negative for behavioral problems, confusion, sleep disturbance, dysphoric mood, decreased concentration and agitation.    Physical Exam   BP (!) 159/85   Pulse 80   Temp 98 F (36.7 C) (Oral)   Ht 5\' 10"  (1.778 m)   Wt 213 lb (96.6 kg)   BMI 30.56 kg/m   Physical Exam  Constitutional: He is oriented to person, place, and time. He appears well-developed and well-nourished. No distress.  HENT:  Mouth/Throat: Oropharynx is clear and moist. No oropharyngeal exudate.  Cardiovascular: Normal rate, regular rhythm and normal heart sounds. Exam reveals no gallop and no friction rub.  No murmur heard.  Pulmonary/Chest: Effort normal and breath sounds normal. No respiratory distress. He has no wheezes.  Abdominal: Soft. Bowel sounds are normal. He exhibits no distension. There is no tenderness.  Lymphadenopathy:  He has no cervical adenopathy. Right supraclavicular LN/fullness Neurological: He is alert and oriented to person, place, and time.  Skin: Skin is warm and dry. No rash noted. No erythema.  Psychiatric: He has a normal mood and affect. His behavior is normal.     CBC Lab Results  Component Value Date   WBC 6.7 06/28/2015   RBC 5.18 06/28/2015   HGB 15.6 06/28/2015   HCT 46.0 06/28/2015   PLT 240 06/28/2015   MCV 88.8 06/28/2015   MCH 30.1 06/28/2015   MCHC 33.9 06/28/2015   RDW 14.1 06/28/2015   LYMPHSABS 1.7 06/28/2015   MONOABS 0.9 06/28/2015   EOSABS 0.3 06/28/2015    BMET Lab Results  Component Value Date   NA 131 (L) 04/25/2015   K 3.3 (L) 04/25/2015   CL 93 (L) 04/25/2015   CO2 27 04/25/2015   GLUCOSE 172 (H)  04/25/2015   BUN 17 04/25/2015   CREATININE 0.76 04/25/2015   CALCIUM 8.8 (L) 04/25/2015   GFRNONAA >60 04/25/2015   GFRAA >60 04/25/2015    Assessment and Plan  Alcohol use = can be associated with vitamin deficiency.will check labs. Continue to cut back  Diabetes = previously not a diabetic until he was dx with mssa. His wife reports hemoglobin a1c as 6. will stop metformin temporarily to see if any changes in his symptoms  Tobacco abuse= maybe at risk for malignancy has supraclavicular fullness. Will get chest xray to strt but likely need chest and neck CT due to smoking hx of 30 pack yr hx  Fatigue = will check labs for  anemia? Vitamin d def? Thyroid function, Thiamine def? Auto immune work up  He denies depression -- though the constellation of symptoms does have some overlay with depression

## 2016-07-24 ENCOUNTER — Telehealth: Payer: Self-pay | Admitting: *Deleted

## 2016-07-24 LAB — COMPLETE METABOLIC PANEL WITH GFR
ALBUMIN: 4.6 g/dL (ref 3.6–5.1)
ALK PHOS: 62 U/L (ref 40–115)
ALT: 129 U/L — ABNORMAL HIGH (ref 9–46)
AST: 51 U/L — ABNORMAL HIGH (ref 10–35)
BILIRUBIN TOTAL: 0.4 mg/dL (ref 0.2–1.2)
BUN: 16 mg/dL (ref 7–25)
CALCIUM: 10.3 mg/dL (ref 8.6–10.3)
CO2: 24 mmol/L (ref 20–31)
Chloride: 104 mmol/L (ref 98–110)
Creat: 0.84 mg/dL (ref 0.70–1.33)
GLUCOSE: 89 mg/dL (ref 65–99)
POTASSIUM: 4.9 mmol/L (ref 3.5–5.3)
SODIUM: 140 mmol/L (ref 135–146)
Total Protein: 6.9 g/dL (ref 6.1–8.1)

## 2016-07-24 LAB — SEDIMENTATION RATE: Sed Rate: 4 mm/hr (ref 0–20)

## 2016-07-24 LAB — HEMOGLOBIN A1C
HEMOGLOBIN A1C: 6.6 % — AB (ref <5.7)
MEAN PLASMA GLUCOSE: 143 mg/dL

## 2016-07-24 LAB — TSH: TSH: 1.64 mIU/L (ref 0.40–4.50)

## 2016-07-24 LAB — C-REACTIVE PROTEIN: CRP: 2.4 mg/L (ref <8.0)

## 2016-07-24 LAB — T4, FREE: FREE T4: 0.9 ng/dL (ref 0.8–1.8)

## 2016-07-24 LAB — HEPATITIS C ANTIBODY: HCV AB: NEGATIVE

## 2016-07-24 LAB — HIV ANTIBODY (ROUTINE TESTING W REFLEX): HIV: NONREACTIVE

## 2016-07-24 NOTE — Telephone Encounter (Addendum)
Notified patient of appt for CT scan at [place] on 07/27/16 at 11:00 AM. Nothing to eat four hours prior to exam. Liquids ok and this is at [facility]. Insurance stated no prior auth needed.

## 2016-07-25 LAB — ANA, IFA COMPREHENSIVE PANEL
ANA: NEGATIVE
ENA SM Ab Ser-aCnc: <1
SM/RNP: <1
SSA (Ro) (ENA) Antibody, IgG: <1
SSB (La) (ENA) Antibody, IgG: <1
Scleroderma (Scl-70) (ENA) Antibody, IgG: <1
ds DNA Ab: <1 IU/mL

## 2016-07-26 ENCOUNTER — Telehealth: Payer: Self-pay | Admitting: *Deleted

## 2016-07-26 NOTE — Telephone Encounter (Signed)
[facility] called regarding tomorrow's ct scan.  They advised this procedure needs to be with contrast only, not with/without contrast per the radiologist.  No need to change authorization or change the order - they will adjust the order and send it for cosign. Landis Gandy, RN

## 2016-07-27 ENCOUNTER — Ambulatory Visit
Admission: RE | Admit: 2016-07-27 | Discharge: 2016-07-27 | Disposition: A | Discharge status: Home / Self Care | Payer: Commercial Managed Care - HMO | Source: Ambulatory Visit | Attending: Internal Medicine | Admitting: Internal Medicine

## 2016-07-27 DIAGNOSIS — R590 Localized enlarged lymph nodes: Secondary | ICD-10-CM

## 2016-07-27 DIAGNOSIS — R5383 Other fatigue: Secondary | ICD-10-CM

## 2016-07-27 MED ORDER — IOPAMIDOL (ISOVUE-300) INJECTION 61%
75.0000 mL | Freq: Once | INTRAVENOUS | Status: AC | PRN
  Administered 2016-07-27: 75 mL via INTRAVENOUS

## 2016-07-28 LAB — VITAMIN B1: VITAMIN B1 (THIAMINE): 10 nmol/L (ref 8–30)

## 2016-07-29 LAB — VITAMIN D 1,25 DIHYDROXY
VITAMIN D 1, 25 (OH) TOTAL: 30 pg/mL (ref 18–72)
Vitamin D3 1, 25 (OH)2: 30 pg/mL

## 2016-08-14 ENCOUNTER — Other Ambulatory Visit (HOSPITAL_BASED_OUTPATIENT_CLINIC_OR_DEPARTMENT_OTHER): Payer: Self-pay

## 2016-08-14 DIAGNOSIS — G4733 Obstructive sleep apnea (adult) (pediatric): Secondary | ICD-10-CM

## 2016-08-14 DIAGNOSIS — I10 Essential (primary) hypertension: Secondary | ICD-10-CM

## 2016-08-22 ENCOUNTER — Ambulatory Visit (INDEPENDENT_AMBULATORY_CARE_PROVIDER_SITE_OTHER): Payer: Commercial Managed Care - HMO | Admitting: Internal Medicine

## 2016-08-22 ENCOUNTER — Encounter: Payer: Self-pay | Admitting: Internal Medicine

## 2016-08-22 VITALS — BP 148/92 | HR 83 | Temp 98.0°F | Ht 70.0 in | Wt 211.0 lb

## 2016-08-22 DIAGNOSIS — E1165 Type 2 diabetes mellitus with hyperglycemia: Secondary | ICD-10-CM

## 2016-08-22 DIAGNOSIS — E559 Vitamin D deficiency, unspecified: Secondary | ICD-10-CM | POA: Diagnosis not present

## 2016-08-22 DIAGNOSIS — R590 Localized enlarged lymph nodes: Secondary | ICD-10-CM

## 2016-08-22 NOTE — Progress Notes (Signed)
Patient ID: Adam Tyler, male   DOB: [birth year], 61 y.o.   MRN: [id number]  HPI 61yo M who was seen roughly 4 weeks ago for vague, constellation of symptoms of fatigue, weight loss, excess sleep, occasional fevers. We had check inflammatory markers which were unrevealing. He had neck CT due to lymphadenopathy concerning for any malignancy which to was non-descript but one change was made was to have him stop his metformin since his hgb 6.6 fairly well controlled to see if symptoms coincided with medicaiton side effects  Feeling much better since stopping metformin Outpatient Encounter Prescriptions as of 08/22/2016  Medication Sig  . losartan-hydrochlorothiazide (HYZAAR) 100-12.5 MG per tablet Take 1 tablet by mouth daily.  . ONE TOUCH ULTRA TEST test strip   . PENNSAID 2 % SOLN apply 2 pumps to affected area twice a day  . venlafaxine XR (EFFEXOR-XR) 75 MG 24 hr capsule Take 75 mg by mouth daily after supper.   . [DISCONTINUED] metFORMIN (GLUCOPHAGE) 500 MG tablet Take 1 tablet (500 mg total) by mouth 2 (two) times daily with a meal.   No facility-administered encounter medications on file as of 08/22/2016.      Patient Active Problem List   Diagnosis Date Noted  . Diabetes ([place]) 04/23/2015  . Pyarthrosis ([place]) 04/22/2015  . Heavy alcohol use   . Elevated liver function tests   . Wound infection after surgery 03/19/2015  . Staphylococcal arthritis of left knee ([employer])   . Staphylococcus aureus infection   . Smoker   . Screen for STD (sexually transmitted disease)   . Cervical mass, right posterior chain 04/29/2013     Health Maintenance Due  Topic Date Due  . PNEUMOCOCCAL POLYSACCHARIDE VACCINE (1) 08/07/1958  . FOOT EXAM  08/07/1966  . OPHTHALMOLOGY EXAM  08/07/1966  . TETANUS/TDAP  08/08/1975  . COLONOSCOPY  08/07/2006     Review of Systems Review of Systems  Constitutional: Negative for fever, chills, diaphoresis, activity change, appetite change, fatigue and  unexpected weight change.  HENT: Negative for congestion, sore throat, rhinorrhea, sneezing, trouble swallowing and sinus pressure.  Eyes: Negative for photophobia and visual disturbance.  Respiratory: Negative for cough, chest tightness, shortness of breath, wheezing and stridor.  Cardiovascular: Negative for chest pain, palpitations and leg swelling.  Gastrointestinal: Negative for nausea, vomiting, abdominal pain, diarrhea, constipation, blood in stool, abdominal distention and anal bleeding.  Genitourinary: Negative for dysuria, hematuria, flank pain and difficulty urinating.  Musculoskeletal: Negative for myalgias, back pain, joint swelling, arthralgias and gait problem.  Skin: Negative for color change, pallor, rash and wound.  Neurological: Negative for dizziness, tremors, weakness and light-headedness.  Hematological: Negative for adenopathy. Does not bruise/bleed easily.  Psychiatric/Behavioral: Negative for behavioral problems, confusion, sleep disturbance, dysphoric mood, decreased concentration and agitation.    Physical Exam   BP (!) 148/92   Pulse 83   Temp 98 F (36.7 C) (Oral)   Ht 5\' 10"  (1.778 m)   Wt 211 lb (95.7 kg)   BMI 30.28 kg/m    Did not examine CBC Lab Results  Component Value Date   WBC 7.7 07/23/2016   RBC 4.70 07/23/2016   HGB 14.7 07/23/2016   HCT 42.6 07/23/2016   PLT 249 07/23/2016   MCV 90.6 07/23/2016   MCH 31.3 07/23/2016   MCHC 34.5 07/23/2016   RDW 14.4 07/23/2016   LYMPHSABS 2,618 07/23/2016   MONOABS 847 07/23/2016   EOSABS 308 07/23/2016    BMET Lab Results  Component Value  Date   NA 140 07/23/2016   K 4.9 07/23/2016   CL 104 07/23/2016   CO2 24 07/23/2016   GLUCOSE 89 07/23/2016   BUN 16 07/23/2016   CREATININE 0.84 07/23/2016   CALCIUM 10.3 07/23/2016   GFRNONAA >89 07/23/2016   GFRAA >89 07/23/2016      Assessment and Plan   DM and metformin side effects = now improved. Recommend to follow up with PCP in 3  months ,recheck his hgb a1c to see if still needs glycemic control agents for diabetes. Possibly can be switched to diet control  Vitamin d def = decrease to 1 pill a day supplementation   Supraclavicular LN = non-revealing on CT. No need for further work up  rtc prn rtc prn

## 2016-09-28 ENCOUNTER — Ambulatory Visit: Payer: Commercial Managed Care - HMO | Attending: Neurology | Admitting: Neurology

## 2016-09-28 DIAGNOSIS — G4733 Obstructive sleep apnea (adult) (pediatric): Secondary | ICD-10-CM | POA: Insufficient documentation

## 2016-09-28 DIAGNOSIS — I10 Essential (primary) hypertension: Secondary | ICD-10-CM

## 2016-10-01 NOTE — Procedures (Signed)
  Linwood A. Merlene Laughter, MD     [email or web address]             NOCTURNAL POLYSOMNOGRAPHY   LOCATION: [facility]     Patient Name: Adam Tyler, Adam Tyler Date: 09/28/2016 Gender: Male D.O.B: 1955/11/12 Age (years): 42 Referring Provider: Phillips Odor MD, ABSM Height (inches): 70 Interpreting Physician: Phillips Odor MD, ABSM Weight (lbs): 210 RPSGT: Peak, Robert BMI: 30 MRN: [id number] Neck Size: 17.00 CLINICAL INFORMATION Sleep Study Type: NPSG  Indication for sleep study: N/A  Epworth Sleepiness Score: 13  SLEEP STUDY TECHNIQUE As per the AASM Manual for the Scoring of Sleep and Associated Events v2.3 (April 2016) with a hypopnea requiring 4% desaturations.  The channels recorded and monitored were frontal, central and occipital EEG, electrooculogram (EOG), submentalis EMG (chin), nasal and oral airflow, thoracic and abdominal wall motion, anterior tibialis EMG, snore microphone, electrocardiogram, and pulse oximetry.  MEDICATIONS Medications self-administered by patient taken the night of the study : N/A  Current Outpatient Prescriptions:  .  losartan-hydrochlorothiazide (HYZAAR) 100-12.5 MG per tablet, Take 1 tablet by mouth daily., Disp: , Rfl:  .  ONE TOUCH ULTRA TEST test strip, , Disp: , Rfl:  .  PENNSAID 2 % SOLN, apply 2 pumps to affected area twice a day, Disp: , Rfl: 0 .  venlafaxine XR (EFFEXOR-XR) 75 MG 24 hr capsule, Take 75 mg by mouth daily after supper. , Disp: , Rfl: 6   SLEEP ARCHITECTURE The study was initiated at 9:20:17 PM and ended at 4:41:24 AM.  Sleep onset time was 58.0 minutes and the sleep efficiency was 83.0%. The total sleep time was 366.1 minutes.  Stage REM latency was 103.5 minutes.  The patient spent 14.61% of the night in stage N1 sleep, 68.45% in stage N2 sleep, 2.46% in stage N3 and 14.48% in REM.  Alpha intrusion was absent.  Supine sleep was 61.63%.  RESPIRATORY PARAMETERS The overall apnea/hypopnea  index (AHI) was 32.4 per hour. There were 18 total apneas, including 17 obstructive, 1 central and 0 mixed apneas. There were 180 hypopneas and 24 RERAs.  The AHI during Stage REM sleep was 28.3 per hour.  AHI while supine was 46.0 per hour.  The mean oxygen saturation was 90.22%. The minimum SpO2 during sleep was 81.00%.  Loud snoring was noted during this study.  CARDIAC DATA The 2 lead EKG demonstrated sinus rhythm. The mean heart rate was 70.79 beats per minute. Other EKG findings include: None. LEG MOVEMENT DATA The total PLMS were 0 with a resulting PLMS index of 0.00. Associated arousal with leg movement index was 0.0.  IMPRESSIONS - Moderate obstructive sleep apnea is documented with study. Consider trial of a AutoPap 8 - 14.      Delano Metz, MD Diplomate, American Board of Sleep Medicine.   ELECTRONICALLY SIGNED ON:  10/01/2016, 8:59 AM [place] PH: [phone]   FX: [phone] [place]

## 2016-10-16 ENCOUNTER — Ambulatory Visit (INDEPENDENT_AMBULATORY_CARE_PROVIDER_SITE_OTHER): Payer: Commercial Managed Care - HMO | Admitting: Urology

## 2016-10-16 DIAGNOSIS — R972 Elevated prostate specific antigen [PSA]: Secondary | ICD-10-CM

## 2016-10-16 DIAGNOSIS — N401 Enlarged prostate with lower urinary tract symptoms: Secondary | ICD-10-CM

## 2016-10-16 DIAGNOSIS — N5201 Erectile dysfunction due to arterial insufficiency: Secondary | ICD-10-CM

## 2017-07-16 ENCOUNTER — Ambulatory Visit (INDEPENDENT_AMBULATORY_CARE_PROVIDER_SITE_OTHER): Payer: 59 | Admitting: Internal Medicine

## 2017-07-16 ENCOUNTER — Encounter: Payer: Self-pay | Admitting: Internal Medicine

## 2017-07-16 VITALS — BP 130/84 | HR 75 | Temp 98.1°F | Ht 70.0 in | Wt 209.0 lb

## 2017-07-16 DIAGNOSIS — R42 Dizziness and giddiness: Secondary | ICD-10-CM | POA: Diagnosis not present

## 2017-07-16 DIAGNOSIS — R5383 Other fatigue: Secondary | ICD-10-CM

## 2017-07-16 NOTE — Progress Notes (Signed)
RFV:  Patient ID: Adam Tyler, male   DOB: 02/27/1956, 62 y.o.   MRN: [id number]  HPI 62yo M who we previously saw for MSSA septic arthritis to left knee, most recent synovectomy and I x D was on 04/19/16. He comes to clinic for evaluation not necessarily of his knee but he is having various problems with  2-3 days of feeling poorly, fatigue, jittery, nausea. Vomiting occasionally, and gagging every day for the past several months. He usually has plenty of energy but of late feeling poorly more frequently.This morning BS was 224. The lowest he has been in 140. He occasionally suffers from vertigo and still has the sensation of lightheadedness ., "feels high" Sometimes visual disturbance No headache Has chills, but no fevers, no nightsweats  He does not recall if this thyroid has been checked  Previously on citalopram and then switched to venlafaxine. His wife wonders if some if his symptoms could be due to drug side effect    Outpatient Encounter Medications as of 07/16/2017  Medication Sig  . losartan-hydrochlorothiazide (HYZAAR) 100-12.5 MG per tablet Take 1 tablet by mouth daily.  . ONE TOUCH ULTRA TEST test strip   . PENNSAID 2 % SOLN apply 2 pumps to affected area twice a day  . venlafaxine XR (EFFEXOR-XR) 75 MG 24 hr capsule Take 75 mg by mouth daily after supper.    No facility-administered encounter medications on file as of 07/16/2017.      Patient Active Problem List   Diagnosis Date Noted  . Diabetes ([place]) 04/23/2015  . Pyarthrosis ([place]) 04/22/2015  . Heavy alcohol use   . Elevated liver function tests   . Wound infection after surgery 03/19/2015  . Staphylococcal arthritis of left knee ([place])   . Staphylococcus aureus infection   . Smoker   . Screen for STD (sexually transmitted disease)   . Cervical mass, right posterior chain 04/29/2013     Health Maintenance Due  Topic Date Due  . PNEUMOCOCCAL POLYSACCHARIDE VACCINE (1) 08/07/1958  . FOOT EXAM  08/07/1966    . OPHTHALMOLOGY EXAM  08/07/1966  . TETANUS/TDAP  08/08/1975  . COLONOSCOPY  08/07/2006  . INFLUENZA VACCINE  01/16/2017  . HEMOGLOBIN A1C  01/20/2017    Social History   Tobacco Use  . Smoking status: Current Every Day Smoker    Packs/day: 0.20    Years: 20.00    Pack years: 4.00    Types: Cigarettes  . Smokeless tobacco: Never Used  Substance Use Topics  . Alcohol use: Yes    Alcohol/week: 4.2 oz    Types: 7 Standard drinks or equivalent per week    Comment: pt said all he can / 3-4 beers night, only a beer a night now ( 04/28/15)  . Drug use: Yes    Types: Marijuana    Comment: uses for pain in knees    Review of Systems Per hpi otherwise 12 point ros is negative Physical Exam   BP 130/84   Pulse 75   Temp 98.1 F (36.7 C) (Oral)   Ht 5\' 10"  (1.778 m)   Wt 209 lb (94.8 kg)   BMI 29.99 kg/m   Physical Exam  Constitutional: He is oriented to person, place, and time. He appears well-developed and well-nourished. No distress.  HENT:  Mouth/Throat: Oropharynx is clear and moist. No oropharyngeal exudate.  Cardiovascular: Normal rate, regular rhythm and normal heart sounds. Exam reveals no gallop and no friction rub.  No murmur heard.  Pulmonary/Chest:  Effort normal and breath sounds normal. No respiratory distress. He has no wheezes.  Abdominal: Soft. Bowel sounds are normal. He exhibits no distension. There is no tenderness.  Lymphadenopathy:  He has no cervical adenopathy.  Neurological: He is alert and oriented to person, place, and time.  Skin: Skin is warm and dry. No rash noted. No erythema.  Psychiatric: He has a normal mood and affect. His behavior is normal.     CBC Lab Results  Component Value Date   WBC 7.7 07/23/2016   RBC 4.70 07/23/2016   HGB 14.7 07/23/2016   HCT 42.6 07/23/2016   PLT 249 07/23/2016   MCV 90.6 07/23/2016   MCH 31.3 07/23/2016   MCHC 34.5 07/23/2016   RDW 14.4 07/23/2016   LYMPHSABS 2,618 07/23/2016   MONOABS 847  07/23/2016   EOSABS 308 07/23/2016    BMET Lab Results  Component Value Date   NA 140 07/23/2016   K 4.9 07/23/2016   CL 104 07/23/2016   CO2 24 07/23/2016   GLUCOSE 89 07/23/2016   BUN 16 07/23/2016   CREATININE 0.84 07/23/2016   CALCIUM 10.3 07/23/2016   GFRNONAA >89 07/23/2016   GFRAA >89 07/23/2016      Assessment and Plan  Pre-syncopal  Description? = recommend to check TSH, and adrenal insufficiency.  Nausea/dizziness = though vague symptoms, would recommend that still to do adrenal insufficiency  Vague constellation of symptoms but I dont think this sounds like any occult infection  willl recommend a few labs and suggestions to do at primary care doc  Will call next week to see if they want to return for further recommendations or evauation.

## 2017-07-16 NOTE — Patient Instructions (Addendum)
Consider checking the following labs/meds changes:  Recommend to check for A) thyroid function test B) rule out for adrenal insufficiency C) wean off of effexor and change to other SSRI, possibly back to citalopram D) has intermittent gas/abdominal distension with nausea - not sure if gastric emptying study maybe useful. Consider giving a trial of bentyl for GI symptoms E) recommend to record Blood sugars when he is having various symptoms to see if any connection between the 2 F) recommend taking temperature once-twice a day to see if he is having fevers. Recommend to get temp if having chills G) if PCP thinks that work up is concerning for infection, please come on back.

## 2017-08-21 ENCOUNTER — Ambulatory Visit: Payer: 59 | Admitting: Endocrinology

## 2017-08-21 ENCOUNTER — Encounter: Payer: Self-pay | Admitting: Endocrinology

## 2017-08-21 DIAGNOSIS — R5383 Other fatigue: Secondary | ICD-10-CM | POA: Diagnosis not present

## 2017-08-21 LAB — CORTISOL
CORTISOL PLASMA: 27 ug/dL
CORTISOL PLASMA: 6.2 ug/dL

## 2017-08-21 MED ORDER — COSYNTROPIN NICU IV SYRINGE 0.25 MG/ML (STANDARD DOSE)
0.2500 mg | Freq: Once | INTRAVENOUS | Status: AC
  Administered 2017-08-21: 0.25 mg via INTRAMUSCULAR

## 2017-08-21 NOTE — Progress Notes (Signed)
Subjective:    Patient ID: Adam Tyler, male    DOB: 01-25-1956, 62 y.o.   MRN: [id number]  HPI Pt is referred by Kathryne Hitch, NP, for poss adrenal insuff.  no h/o abdominal or brain injury.  No h/o cancer, thyroid problems, seizures, hypoglycemia, amyloidosis, or tuberculosis.  No h/o ketoconazole, rifampin, or dilantin.  Pt states 2 1/2 years of intermitt nausea, and assoc severe fatigue. No recent steroids.  Past Medical History:  Diagnosis Date  . Acute medial meniscal injury of knee    left  . Anxiety    h/o panic attacks from " stress"  . Arthritis   . Arthritis, septic, knee ([place])   . Blood glucose elevated   . Cancer ([place]) 2007   melanoma forehead   . Diabetes mellitus without complication ([place])    EYC1K 04/20/15 was 7.3   . GERD (gastroesophageal reflux disease)   . Hypertension   . Infection    left knee  . Sciatica of left side    per pt  "buldging disc- lumbar"  . Sleep apnea    pt denies  . Upper respiratory infection 05/17/13   no fever since 05/18/13    Past Surgical History:  Procedure Laterality Date  . CHONDROPLASTY Left 03/09/2015   Procedure: CHONDROPLASTY;  Surgeon: Earlie Server, MD;  Location: [place];  Service: Orthopedics;  Laterality: Left;  . HAND SURGERY    . HERNIA REPAIR    . KNEE ARTHROSCOPY Left 03/18/2015   Procedure: ARTHROSCOPIC, LAVAGE, SYNOVECTOMY LEFT KNEE ;  Surgeon: Earlie Server, MD;  Location: [place];  Service: Orthopedics;  Laterality: Left;  . KNEE ARTHROSCOPY Left 03/23/2015   Procedure: LEFT KNEE I&D WITH LAVAGE;  Surgeon: Earlie Server, MD;  Location: [place];  Service: Orthopedics;  Laterality: Left;  . KNEE ARTHROSCOPY Left 04/22/2015   Procedure: LEFT KNEE ARTHROSCOPY WITH LAVAGE AND DRAINING WITH SYNOVECTOMY;  Surgeon: Earlie Server, MD;  Location: [place];  Service: Orthopedics;  Laterality: Left;  . KNEE ARTHROSCOPY WITH MEDIAL MENISECTOMY Left 03/09/2015   Procedure: KNEE  ARTHROSCOPY WITH  PARTIAL MEDIAL MENISECTOMY;  Surgeon: Earlie Server, MD;  Location: [place];  Service: Orthopedics;  Laterality: Left;  Marland Kitchen MASS EXCISION Right 06/19/2013   Procedure: EXCISION MASS RIGHT POSTERIOR NECK;  Surgeon: Earnstine Regal, MD;  Location: [facility];  Service: General;  Laterality: Right;  . SKIN GRAFT Right   . TONSILLECTOMY      Social History   Socioeconomic History  . Marital status: Married    Spouse name: Not on file  . Number of children: Not on file  . Years of education: Not on file  . Highest education level: Not on file  Social Needs  . Financial resource strain: Not on file  . Food insecurity - worry: Not on file  . Food insecurity - inability: Not on file  . Transportation needs - medical: Not on file  . Transportation needs - non-medical: Not on file  Occupational History  . Not on file  Tobacco Use  . Smoking status: Current Every Day Smoker    Packs/day: 0.20    Years: 20.00    Pack years: 4.00    Types: Cigarettes  . Smokeless tobacco: Never Used  Substance and Sexual Activity  . Alcohol use: Yes    Alcohol/week: 4.2 oz    Types: 7 Standard drinks or equivalent per week    Comment: pt said all he can /  3-4 beers night, only a beer a night now ( 04/28/15)  . Drug use: Yes    Types: Marijuana    Comment: uses for pain in knees  . Sexual activity: Yes  Other Topics Concern  . Not on file  Social History Narrative  . Not on file    Current Outpatient Medications on File Prior to Visit  Medication Sig Dispense Refill  . dicyclomine (BENTYL) 20 MG tablet Take 20 mg by mouth every 6 (six) hours.    Marland Kitchen dimenhyDRINATE (DRAMAMINE) 50 MG tablet Take 50 mg by mouth every 8 (eight) hours as needed.    . Dulaglutide (TRULICITY) 1.5 QV/9.5GL SOPN Inject 1.5 mg into the skin once a week.    Marland Kitchen EPINEPHrine 0.3 mg/0.3 mL IJ SOAJ injection Inject 0.3 mg into the skin as needed.    . linagliptin (TRADJENTA) 5 MG TABS tablet Take 5 mg by  mouth daily.    Marland Kitchen losartan-hydrochlorothiazide (HYZAAR) 100-12.5 MG per tablet Take 1 tablet by mouth daily.    . ondansetron (ZOFRAN-ODT) 8 MG disintegrating tablet Take 8 mg by mouth as needed.    . ONE TOUCH ULTRA TEST test strip     . ONETOUCH DELICA LANCETS 87F MISC CHECK BLOOD SUGAR TWICE A DAY  3  . PENNSAID 2 % SOLN apply 2 pumps to affected area twice a day  0  . vitamin B-12 (CYANOCOBALAMIN) 1000 MCG tablet Take 5,000 mcg by mouth daily.    Marland Kitchen venlafaxine XR (EFFEXOR-XR) 75 MG 24 hr capsule Take 75 mg by mouth daily after supper.   6  . venlafaxine XR (EFFEXOR-XR) 75 MG 24 hr capsule Take 75 mg by mouth daily.     No current facility-administered medications on file prior to visit.     Allergies  Allergen Reactions  . Adhesive [Tape] Other (See Comments)    Leg raw and red, burning from adhesive bandage 03/18/15  . Strawberry Extract Hives and Nausea And Vomiting    Family History  Problem Relation Age of Onset  . Cancer Mother        lymphnode  . Stroke Father     BP (!) 142/82 (BP Location: Left Arm, Patient Position: Sitting, Cuff Size: Normal)   Pulse 80   Wt 199 lb 3.2 oz (90.4 kg)   SpO2 97%   BMI 28.58 kg/m    Review of Systems Denies fever, weight loss, headache, sob, abd pain, seizure, muscle weakness, easy bruising, diarrhea, vitiligo, excessive sweating, or change in skin tone.  He has intermitt blurry vision, difficulty with concentration, cold intolerance, anxiety, belching, palpitations, and lightheadedness.      Objective:   Physical Exam VS: see vs page GEN: no distress HEAD: head: no deformity eyes: no periorbital swelling, no proptosis external nose and ears are normal mouth: no lesion seen NECK: supple, thyroid is not enlarged.  CHEST WALL: no deformity LUNGS: clear to auscultation CV: reg rate and rhythm, no murmur ABD: abdomen is soft, nontender.  no hepatosplenomegaly.  not distended.  Self-reducing ventral hernia MUSCULOSKELETAL:  muscle bulk and strength are grossly normal.  no obvious joint swelling.  gait is steady, but he favors LLE.   EXTEMITIES: no deformity.  no edema PULSES: no carotid bruit NEURO:  cn 2-12 grossly intact.   readily moves all 4's.  sensation is intact to touch on all 4's.  SKIN:  Normal texture and temperature.  No rash or suspicious lesion is visible.   NODES:  None palpable at  the neck PSYCH: alert, well-oriented.  Does not appear anxious nor depressed.  I have reviewed outside records, and summarized:  Pt was noted to have fatigue, and referred here.  He was noted to have sxs of fatigue and depression.  Diabetes and arthropathy were also addressed.  He has never had adrenal imaging  Lab Results  Component Value Date   TSH 1.64 07/23/2016       Assessment & Plan:  Fatigue, severe, new to me, uncertain etiology.  Check ACTH stim test.   Patient Instructions  blood tests are requested for you today.  We'll let you know about the results.

## 2017-08-21 NOTE — Patient Instructions (Signed)
blood tests are requested for you today.  We'll let you know about the results.  

## 2017-08-23 ENCOUNTER — Telehealth: Payer: Self-pay | Admitting: *Deleted

## 2017-08-23 NOTE — Telephone Encounter (Signed)
I called and gave patient normal results.

## 2017-08-23 NOTE — Telephone Encounter (Signed)
Best number [phone]  Pt called checking lab results

## 2017-09-02 DIAGNOSIS — H539 Unspecified visual disturbance: Secondary | ICD-10-CM | POA: Diagnosis not present

## 2017-09-02 DIAGNOSIS — R51 Headache: Secondary | ICD-10-CM | POA: Diagnosis not present

## 2017-09-02 DIAGNOSIS — F419 Anxiety disorder, unspecified: Secondary | ICD-10-CM | POA: Diagnosis not present

## 2017-09-04 IMAGING — XA DG FLUORO GUIDE CV LINE
1 series · 1 of 1 positions shown · IV contrast (IODINE)
Comparison: none

CLINICAL DATA: 59-year-old with bleeding from PICC line due to
broken hub. Patient needs a line exchange.

[Series 1: carotid 1 · 1 of 1 slices shown]
[im 1/1]
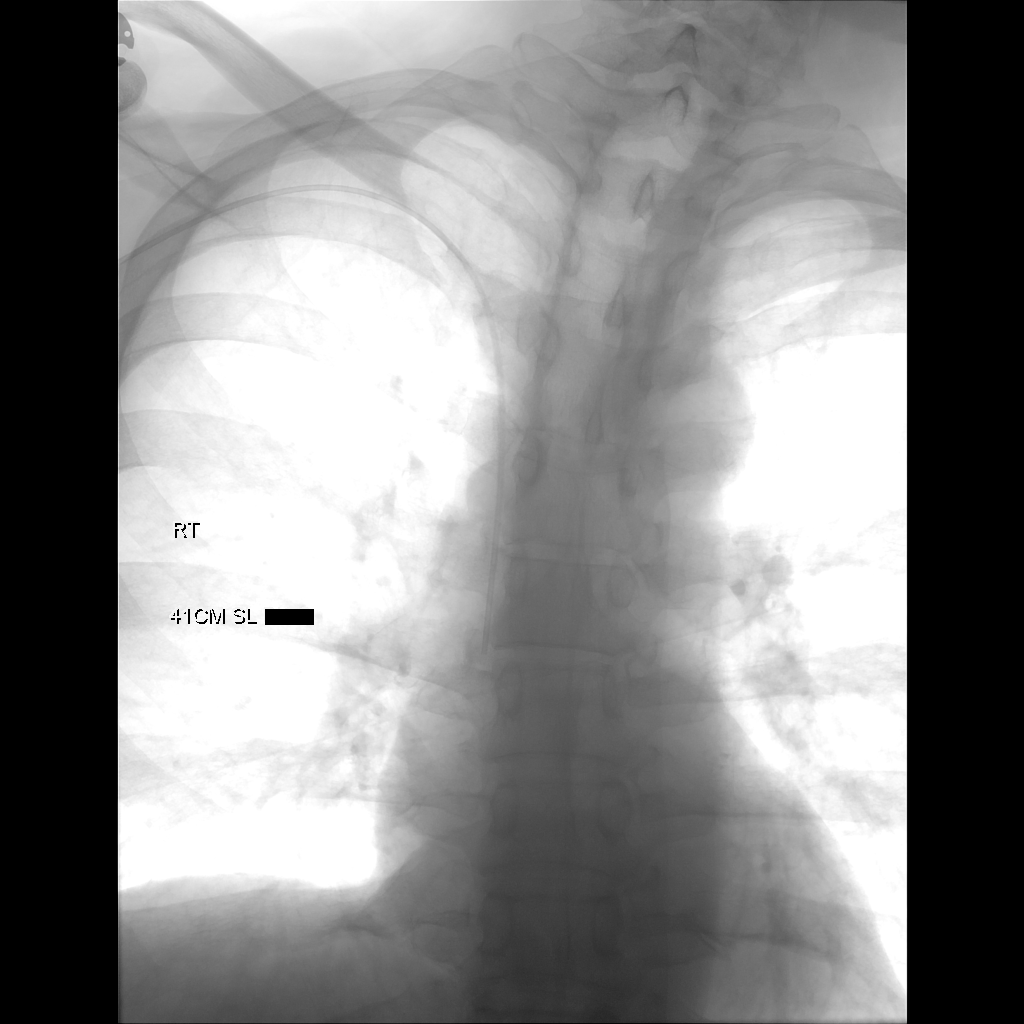

[1 of 1 positions shown; findings below may reference images not displayed]

EXAM:
EXCHANGE OF PICC LINE WITH FLUOROSCOPY

FLUOROSCOPY TIME:  12 seconds, and 12.4 mGy

PROCEDURE:
The procedure was explained to the patient. The risks and benefits
of the procedure were discussed and the patient's questions were
addressed. Informed consent was obtained from the patient. The right
arm was prepped and draped in sterile fashion. Maximal barrier
sterile technique was utilized including caps, mask, sterile gowns,
sterile gloves, sterile drape, hand hygiene and skin antiseptic. The
catheter was pulled back and cut. A 0.018 wire was placed. A
peel-away sheath was placed. A new 41 cm single lumen Power PICC
line was placed through the peel-away sheath. The catheter tip was
placed at the junction of the SVC and right atrium. Lumen aspirated
and flushed well. Catheter secured to the skin. Fluoroscopic images
were taken and saved for this procedure.

Catheter length: 41 cm

Complications: None

Estimated blood loss: Minimal
FINDINGS: Catheter tip at the junction of the SVC and right atrium.
IMPRESSION: Successful replacement of the right arm PICC line with fluoroscopy.

## 2017-10-25 ENCOUNTER — Other Ambulatory Visit: Payer: Self-pay | Admitting: Specialist

## 2017-10-25 DIAGNOSIS — R519 Headache, unspecified: Secondary | ICD-10-CM

## 2017-10-25 DIAGNOSIS — R42 Dizziness and giddiness: Secondary | ICD-10-CM

## 2017-10-25 DIAGNOSIS — R51 Headache: Secondary | ICD-10-CM

## 2017-11-07 ENCOUNTER — Ambulatory Visit
Admission: RE | Admit: 2017-11-07 | Discharge: 2017-11-07 | Disposition: A | Discharge status: Home / Self Care | Payer: Self-pay | Source: Ambulatory Visit | Attending: Specialist | Admitting: Specialist

## 2017-11-07 DIAGNOSIS — R519 Headache, unspecified: Secondary | ICD-10-CM

## 2017-11-07 DIAGNOSIS — R42 Dizziness and giddiness: Secondary | ICD-10-CM | POA: Diagnosis not present

## 2017-11-07 DIAGNOSIS — R51 Headache: Secondary | ICD-10-CM

## 2017-11-07 MED ORDER — GADOBENATE DIMEGLUMINE 529 MG/ML IV SOLN
19.0000 mL | Freq: Once | INTRAVENOUS | Status: AC | PRN
  Administered 2017-11-07: 19 mL via INTRAVENOUS

## 2019-10-02 DIAGNOSIS — I779 Disorder of arteries and arterioles, unspecified: Secondary | ICD-10-CM | POA: Insufficient documentation

## 2019-10-02 DIAGNOSIS — R911 Solitary pulmonary nodule: Secondary | ICD-10-CM | POA: Insufficient documentation

## 2019-10-13 ENCOUNTER — Ambulatory Visit (INDEPENDENT_AMBULATORY_CARE_PROVIDER_SITE_OTHER): Payer: Medicare HMO | Admitting: Vascular Surgery

## 2019-10-13 ENCOUNTER — Encounter: Payer: Self-pay | Admitting: Vascular Surgery

## 2019-10-13 ENCOUNTER — Other Ambulatory Visit: Payer: Self-pay

## 2019-10-13 DIAGNOSIS — R42 Dizziness and giddiness: Secondary | ICD-10-CM | POA: Diagnosis not present

## 2019-10-13 NOTE — Progress Notes (Signed)
Patient name: Adam Tyler MRN: [id number] DOB: [birth year] Sex: male  REASON FOR CONSULT: Vertebral artery disease in the setting of chronic dizziness  HPI: Adam Tyler is a 64 y.o. male, with history of diabetes, hypertension, GERD that presents for evaluation of vertebral artery disease.  Patient reports that he was in his normal state of health until about 4 years ago when he had a left knee arthroscopic surgery.  After that he said he has had issues with dizziness on daily basis does not matter if he sitting standing it always bothers him.  He reports no other neurologic events.  He had no other focal symptoms.  Apparently he is being seen by a neurologist.  He has had multiple work-ups including MRI brain with MRA in 2019 for dizziness that showed no acute intracranial abnormality and normal intracranial MRA.  Recently given his ongoing dizziness he had further work-up with a CTA neck at [facility] that showed no evidence of acute intracranial abnormality with bilateral common and internal carotid arteries patent within the neck without significant stenosis.  The vertebral arteries are also patent in the neck with mild atherosclerotic narrowing at the origins.  Ultimately a duplex subsequently performed that showed minimal amount of atherosclerotic plaque with no significant stenosis in both ICAs.  He had antegrade flow in both vertebral arteries.  States the dizziness is the same.    Past Medical History:  Diagnosis Date  . Acute medial meniscal injury of knee    left  . Anxiety    h/o panic attacks from " stress"  . Arthritis   . Arthritis, septic, knee ([place])   . Blood glucose elevated   . Cancer ([place]) 2007   melanoma forehead   . Diabetes mellitus without complication ([place])    99991111 04/20/15 was 7.3   . GERD (gastroesophageal reflux disease)   . Hypertension   . Infection    left knee  . Sciatica of left side    per pt  "buldging disc- lumbar"  . Sleep apnea    pt denies   . Upper respiratory infection 05/17/13   no fever since 05/18/13    Past Surgical History:  Procedure Laterality Date  . CHONDROPLASTY Left 03/09/2015   Procedure: CHONDROPLASTY;  Surgeon: Earlie Server, MD;  Location: [place];  Service: Orthopedics;  Laterality: Left;  . HAND SURGERY    . HERNIA REPAIR    . KNEE ARTHROSCOPY Left 03/18/2015   Procedure: ARTHROSCOPIC, LAVAGE, SYNOVECTOMY LEFT KNEE ;  Surgeon: Earlie Server, MD;  Location: [place];  Service: Orthopedics;  Laterality: Left;  . KNEE ARTHROSCOPY Left 03/23/2015   Procedure: LEFT KNEE I&D WITH LAVAGE;  Surgeon: Earlie Server, MD;  Location: [place];  Service: Orthopedics;  Laterality: Left;  . KNEE ARTHROSCOPY Left 04/22/2015   Procedure: LEFT KNEE ARTHROSCOPY WITH LAVAGE AND DRAINING WITH SYNOVECTOMY;  Surgeon: Earlie Server, MD;  Location: [place];  Service: Orthopedics;  Laterality: Left;  . KNEE ARTHROSCOPY WITH MEDIAL MENISECTOMY Left 03/09/2015   Procedure: KNEE ARTHROSCOPY WITH  PARTIAL MEDIAL MENISECTOMY;  Surgeon: Earlie Server, MD;  Location: [place];  Service: Orthopedics;  Laterality: Left;  Marland Kitchen MASS EXCISION Right 06/19/2013   Procedure: EXCISION MASS RIGHT POSTERIOR NECK;  Surgeon: Earnstine Regal, MD;  Location[facility];  Service: General;  Laterality: Right;  . SKIN GRAFT Right   . TONSILLECTOMY      Family History  Problem Relation Age of  Onset  . Cancer Mother        lymphnode  . Stroke Father     SOCIAL HISTORY: Social History   Socioeconomic History  . Marital status: Married    Spouse name: Not on file  . Number of children: Not on file  . Years of education: Not on file  . Highest education level: Not on file  Occupational History  . Not on file  Tobacco Use  . Smoking status: Current Every Day Smoker    Packs/day: 0.20    Years: 20.00    Pack years: 4.00    Types: Cigarettes  . Smokeless tobacco: Never Used  Substance and Sexual Activity  .  Alcohol use: Yes    Alcohol/week: 7.0 standard drinks    Types: 7 Standard drinks or equivalent per week    Comment: pt said all he can / 3-4 beers night, only a beer a night now ( 04/28/15)  . Drug use: Yes    Types: Marijuana    Comment: uses for pain in knees  . Sexual activity: Yes  Other Topics Concern  . Not on file  Social History Narrative  . Not on file   Social Determinants of Health   Financial Resource Strain:   . Difficulty of Paying Living Expenses:   Food Insecurity:   . Worried About Charity fundraiser in the Last Year:   . Arboriculturist in the Last Year:   Transportation Needs:   . Film/video editor (Medical):   Marland Kitchen Lack of Transportation (Non-Medical):   Physical Activity:   . Days of Exercise per Week:   . Minutes of Exercise per Session:   Stress:   . Feeling of Stress :   Social Connections:   . Frequency of Communication with Friends and Family:   . Frequency of Social Gatherings with Friends and Family:   . Attends Religious Services:   . Active Member of Clubs or Organizations:   . Attends Archivist Meetings:   Marland Kitchen Marital Status:   Intimate Partner Violence:   . Fear of Current or Ex-Partner:   . Emotionally Abused:   Marland Kitchen Physically Abused:   . Sexually Abused:     Allergies  Allergen Reactions  . Strawberry Extract Hives and Nausea And Vomiting  . Adhesive [Tape] Other (See Comments)    Leg raw and red, burning from adhesive bandage 03/18/15  . No Known Allergies   . Other Other (See Comments)    Leg raw and red, burning from adhesive bandage 03/18/15    Current Outpatient Medications  Medication Sig Dispense Refill  . atorvastatin (LIPITOR) 10 MG tablet Take 10 mg by mouth daily.    Marland Kitchen dicyclomine (BENTYL) 20 MG tablet Take 20 mg by mouth every 6 (six) hours.    Marland Kitchen dimenhyDRINATE (DRAMAMINE) 50 MG tablet Take 50 mg by mouth every 8 (eight) hours as needed.    Marland Kitchen EPINEPHrine 0.3 mg/0.3 mL IJ SOAJ injection Inject 0.3 mg into  the skin as needed.    . hydrochlorothiazide (HYDRODIURIL) 25 MG tablet Take 25 mg by mouth daily.    Marland Kitchen losartan-hydrochlorothiazide (HYZAAR) 100-12.5 MG per tablet Take 1 tablet by mouth daily.    . ondansetron (ZOFRAN-ODT) 8 MG disintegrating tablet Take 8 mg by mouth as needed.    . ONE TOUCH ULTRA TEST test strip     . ONETOUCH DELICA LANCETS 99991111 MISC CHECK BLOOD SUGAR TWICE A DAY  3  .  PENNSAID 2 % SOLN apply 2 pumps to affected area twice a day  0  . venlafaxine XR (EFFEXOR-XR) 75 MG 24 hr capsule Take 75 mg by mouth daily after supper.   6  . vitamin B-12 (CYANOCOBALAMIN) 1000 MCG tablet Take 5,000 mcg by mouth daily.    . Dulaglutide (TRULICITY) 1.5 0000000 SOPN Inject 1.5 mg into the skin once a week.    . linagliptin (TRADJENTA) 5 MG TABS tablet Take 5 mg by mouth daily.    Marland Kitchen venlafaxine XR (EFFEXOR-XR) 75 MG 24 hr capsule Take 75 mg by mouth daily.     No current facility-administered medications for this visit.    REVIEW OF SYSTEMS:  [X]  denotes positive finding, [ ]  denotes negative finding Cardiac  Comments:  Chest pain or chest pressure:    Shortness of breath upon exertion:    Short of breath when lying flat:    Irregular heart rhythm:        Vascular    Pain in calf, thigh, or hip brought on by ambulation:    Pain in feet at night that wakes you up from your sleep:     Blood clot in your veins:    Leg swelling:         Pulmonary    Oxygen at home:    Productive cough:     Wheezing:         Neurologic    Sudden weakness in arms or legs:     Sudden numbness in arms or legs:     Sudden onset of difficulty speaking or slurred speech:    Temporary loss of vision in one eye:     Problems with dizziness:         Gastrointestinal    Blood in stool:     Vomited blood:         Genitourinary    Burning when urinating:     Blood in urine:        Psychiatric    Major depression:         Hematologic    Bleeding problems:    Problems with blood clotting too  easily:        Skin    Rashes or ulcers:        Constitutional    Fever or chills:      PHYSICAL EXAM: Vitals:   10/13/19 1225 10/13/19 1230  BP: (!) 151/91 (!) 141/84  Pulse: 81 81  Resp: 18   SpO2: 98%   Weight: 191 lb (86.6 kg)   Height: 5\' 10"  (1.778 m)     GENERAL: The patient is a well-nourished male, in no acute distress. The vital signs are documented above. CARDIAC: There is a regular rate and rhythm.  PULMONARY: There is good air exchange bilaterally without wheezing or rales. ABDOMEN: Soft and non-tender with normal pitched bowel sounds.  MUSCULOSKELETAL: There are no major deformities or cyanosis. NEUROLOGIC: No focal weakness or paresthesias are detected.  CN II-XII grossly intact. SKIN: There are no ulcers or rashes noted. PSYCHIATRIC: The patient has a normal affect.  DATA:   CTA neck at [facility] that showed no evidence of acute intracranial abnormality with bilateral common and internal carotid arteries patent within the neck without significant stenosis.  The vertebral arteries are also patent in the neck with mild atherosclerotic narrowing at the origins.  Verts appear co-dominant  Carotid duplex at [facility] showed minimal amount of atherosclerotic plaque with no significant  stenosis in both ICAs.  He had antegrade flow in both vertebral arteries.   Assessment/Plan:  64 year old male presents for evaluation of vertebral artery disease in the setting of dizziness over the last 4 years.  I discussed with both the patient and his wife in detail that after review of CTA, I see no evidence of significant vertebral artery disease and only some mild calcification at the ostium.  I do not think he has any significant vertebral artery disease that would warrant intervention.  I think we can plan for surveillance of his carotid artery and vertebral artery disease with another carotid duplex in 3 years.  Discussed they call with questions or  concerns.   Marty Heck, MD [facility] Office: [phone]

## 2019-10-14 ENCOUNTER — Other Ambulatory Visit: Payer: Self-pay | Admitting: *Deleted

## 2019-10-14 DIAGNOSIS — I6529 Occlusion and stenosis of unspecified carotid artery: Secondary | ICD-10-CM

## 2019-10-14 DIAGNOSIS — R42 Dizziness and giddiness: Secondary | ICD-10-CM

## 2020-03-26 IMAGING — MR MR MRA HEAD W/O CM
13 series · 44 of 48 positions shown · IV contrast (multihance)
Comparison: None.

CLINICAL DATA: Initial evaluation for intermittent dizziness for 2
years.

EXAM:
MRI HEAD WITHOUT AND WITH CONTRAST
MRA HEAD WITHOUT CONTRAST
TECHNIQUE: Multiplanar, multiecho pulse sequences of the brain and surrounding
structures were obtained without and with intravenous contrast.
Angiographic images of the head were obtained using MRA technique
without contrast.
CONTRAST:  19mL MULTIHANCE GADOBENATE DIMEGLUMINE 529 MG/ML IV SOLN

[Series 2: t1_se_sag · sagittal · 5.0mm · 0.45mm/px · 2 of 21 slices shown]
[im 1/21]
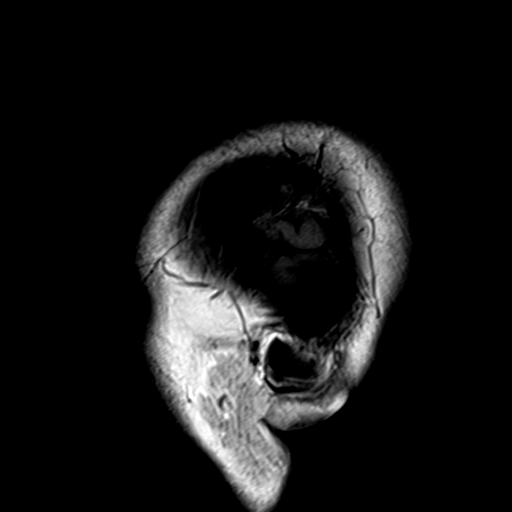
[im 21/21]
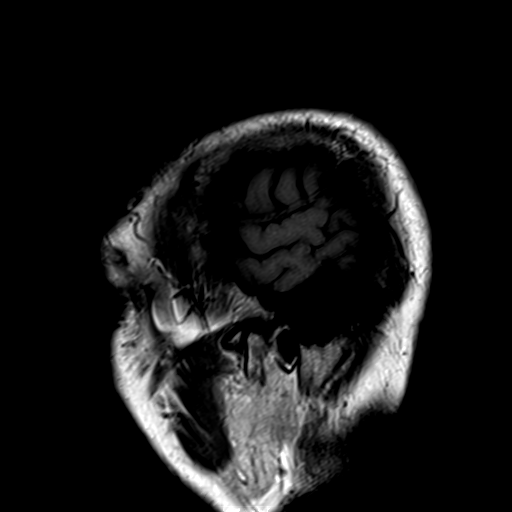

[Series 3: ep2d_diff_(id)_trace · axial · 3.0mm · 1.80mm/px · z∈[-47,+97]mm · 7 of 98 slices shown]
[im 1/98]
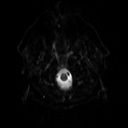
[im 17/98]
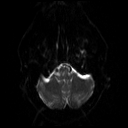
[im 33/98]
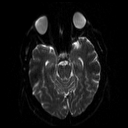
[im 49/98]
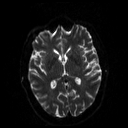
[im 65/98]
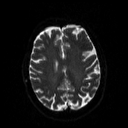
[im 81/98]
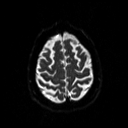
[im 98/98]
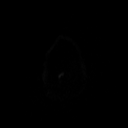

[Series 4: ep2d_diff_(id)_trace_adc · axial · 3.0mm · 1.80mm/px · z∈[-47,+97]mm · 3 of 47 slices shown]
[im 1/47]
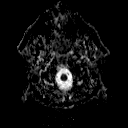
[im 24/47]
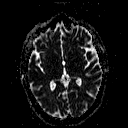
[im 47/47]
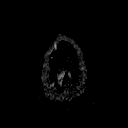

[Series 5: ep2d_diff_cor · coronal · 5.0mm · 1.77mm/px · 3 of 50 slices shown]
[im 1/50]
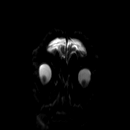
[im 25/50]
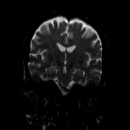
[im 50/50]
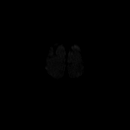

[Series 6: ep2d_diff_cor_adc · coronal · 5.0mm · 1.77mm/px · 1 of 25 slices shown]
[im 1/25]
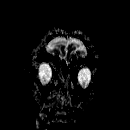

[Series 9: swi_images · axial · 2.0mm · 0.90mm/px · z∈[-47,+95]mm · 4 of 72 slices shown]
[im 1/72]
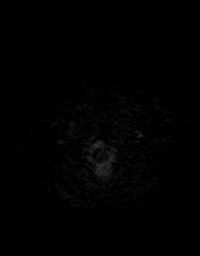
[im 24/72]
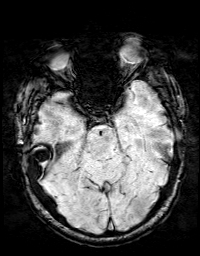
[im 48/72]
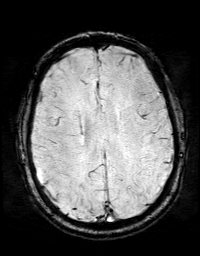
[im 72/72]
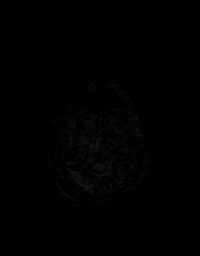

[Series 15: tof_3d_multi-slab new · axial · 0.7mm · 0.35mm/px · z∈[-34,+63]mm · 8 of 143 slices shown]
[im 1/143]
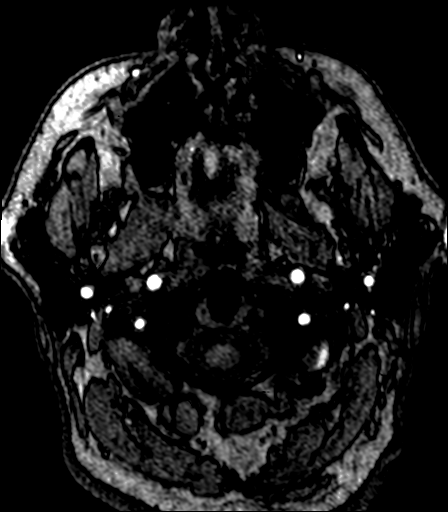
[im 21/143]
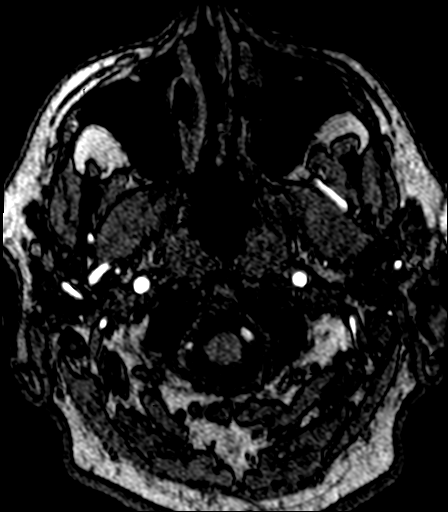
[im 41/143]
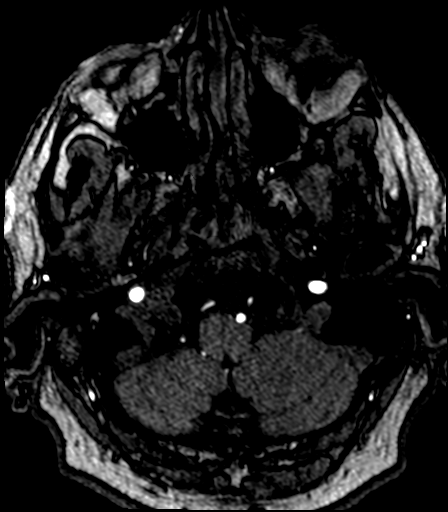
[im 61/143]
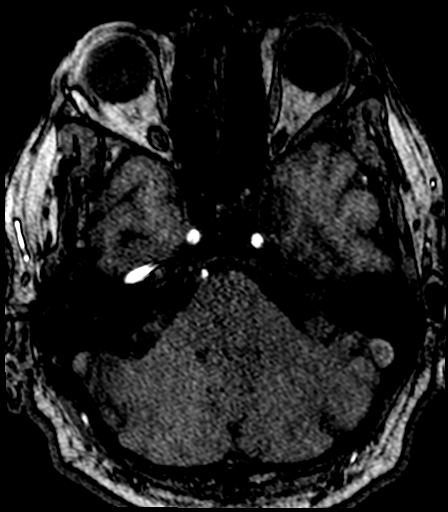
[im 82/143]
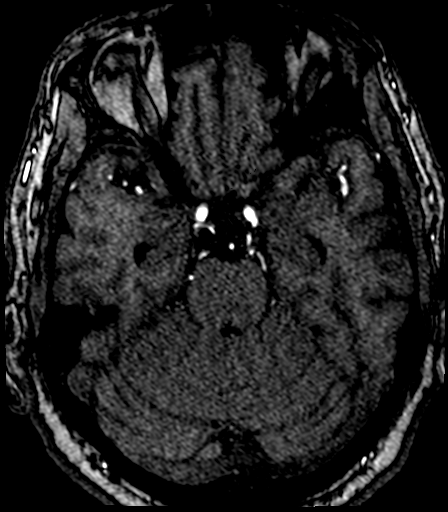
[im 102/143]
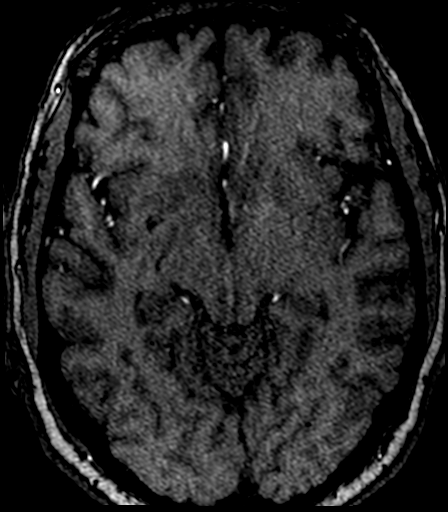
[im 122/143]
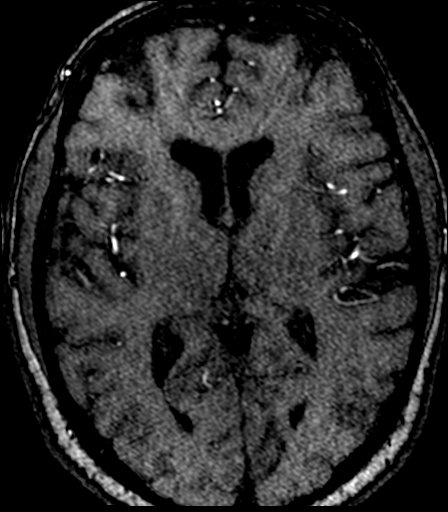
[im 143/143]
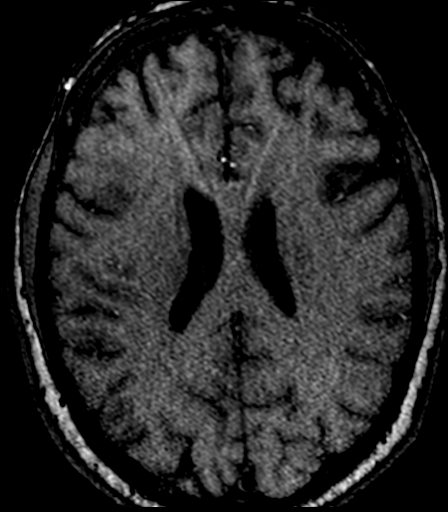

[Series 20: t2_tse_tra_512 · axial · 5.0mm · 0.60mm/px · 1 of 24 slices shown]
[im 1/24]
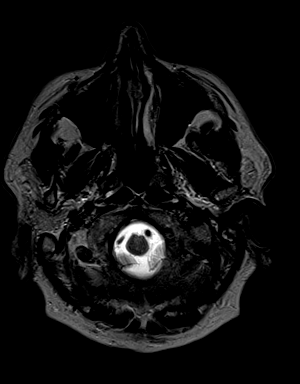

[Series 21: FLAIR · axial · 3.0mm · 0.43mm/px · 1 of 25 slices shown]
[im 1/25]
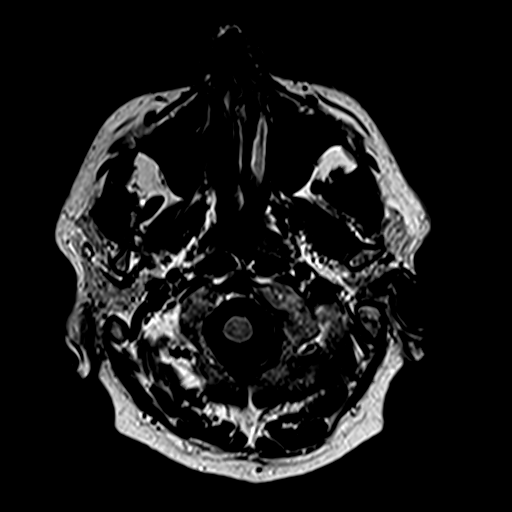

[Series 26: t1_mpr_tra · axial · 1.0mm · 0.72mm/px · z∈[-35,+108]mm · 8 of 144 slices shown]
[im 1/144]
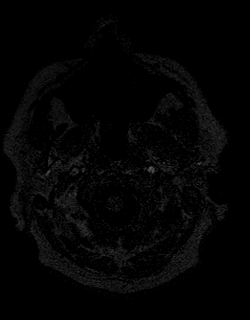
[im 21/144]
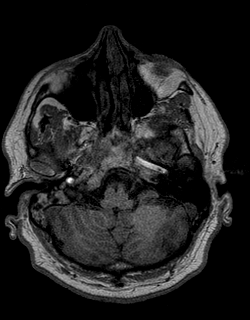
[im 41/144]
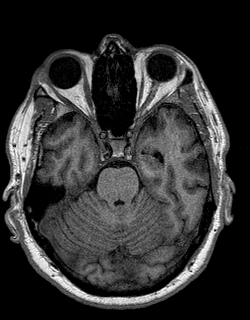
[im 62/144]
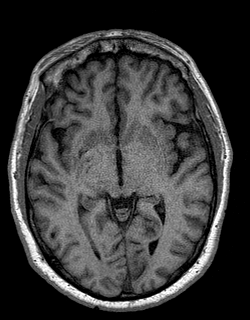
[im 82/144]
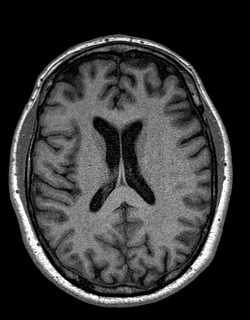
[im 103/144]
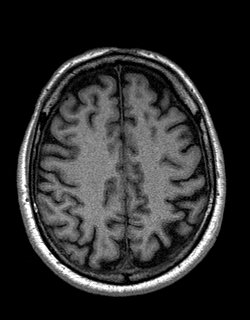
[im 123/144]
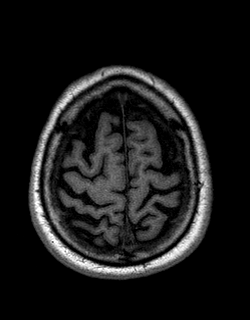
[im 144/144]
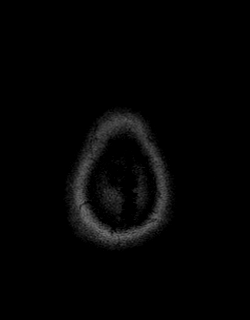

[Series 27: T2 · coronal · 5.0mm · 0.45mm/px · 1 of 26 slices shown]
[im 1/26]
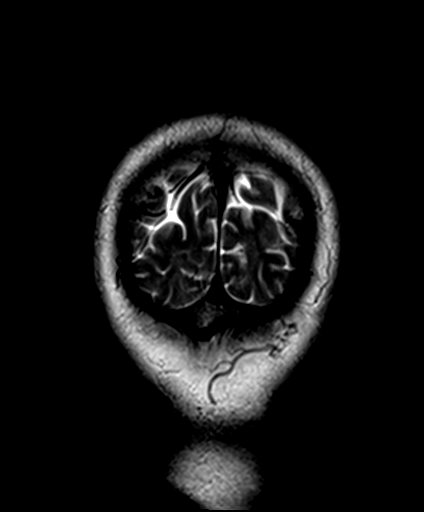

[Series 30: T1 post-contrast · coronal · 5.0mm · 0.72mm/px · 1 of 26 slices shown]
[im 1/26]
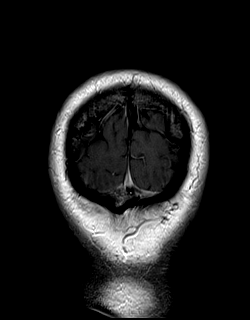

[Series 31: post t1_mpr_tra · axial · 1.0mm · 0.72mm/px · z∈[-35,+26]mm · 4 of 144 slices shown]
[im 1/144]
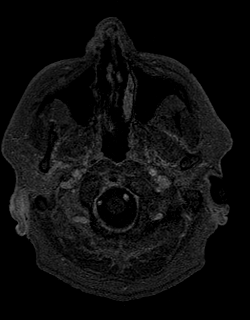
[im 21/144]
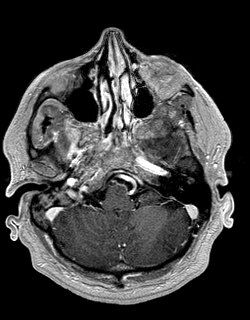
[im 41/144]
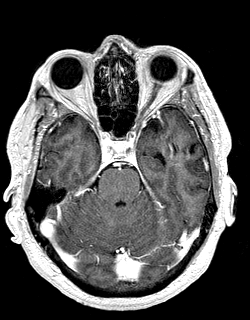
[im 62/144]
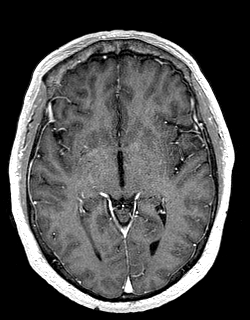

[44 of 48 positions shown; findings below may reference images not displayed]

FINDINGS: MRI HEAD FINDINGS

Brain: Cerebral volume within normal limits for age. Minimal
T2/FLAIR hyperintensity within the periventricular and deep white
matter both cerebral hemispheres, most like related chronic small
vessel ischemic change, mild for age.

No abnormal foci of restricted diffusion to suggest acute or
subacute ischemia. Gray-white matter differentiation maintained. No
encephalomalacia to suggest chronic infarction. No foci of
susceptibility artifact to suggest acute or chronic intracranial
hemorrhage.

No mass lesion, midline shift or mass effect. No hydrocephalus. No
extra-axial fluid collection. Major dural sinuses grossly patent. No
abnormal enhancement.

Pituitary gland suprasellar region normal. Midline structures intact
and normal.

Vascular: Major intracranial vascular flow voids are well
maintained.

Skull and upper cervical spine: Craniocervical junction normal.
Upper cervical spine within normal limits. Bone marrow signal
intensity normal. No scalp soft tissue abnormality.

Sinuses/Orbits: Globes and orbital soft tissues within normal
limits. Paranasal sinuses are clear. Small left mastoid effusion
noted, of doubtful significance. Inner ear structures grossly
normal.

Other: None.

MRA HEAD FINDINGS

ANTERIOR CIRCULATION:

Examination mildly degraded by motion artifact.

Distal cervical segments of the internal carotid arteries are widely
patent with antegrade flow. Petrous, cavernous, and supraclinoid
segments widely patent without stenosis. Origin of the ophthalmic
arteries patent bilaterally. ICA termini widely patent. A1 segments,
anterior communicating artery common anterior cerebral arteries
widely patent bilaterally. M1 segments patent without stenosis or
occlusion. Normal MCA bifurcations. No proximal M2 occlusion. Distal
MCA branches well perfused and symmetric.

POSTERIOR CIRCULATION:

Left vertebral artery dominant and patent to the vertebrobasilar
junction without stenosis. Right vertebral artery largely divides at
the takeoff of PICA, with a small branch ascending towards the
vertebrobasilar junction. Right PICA patent. Left PICA not
visualized. Basilar mildly tortuous but widely patent to its distal
aspect without stenosis. Superior cerebral arteries patent
bilaterally. Fetal type origin of the left PCA. Hypoplastic right P1
with prominent right posterior communicating artery. PCAs widely
patent to their distal aspects without stenosis.

No intracranial aneurysm.
IMPRESSION: MRI HEAD IMPRESSION:

1. No acute intracranial abnormality.
2. Mild for age chronic microvascular ischemic change. Otherwise
unremarkable and normal brain MRI.

MRA HEAD IMPRESSION:

Normal intracranial MRA.

## 2020-09-27 ENCOUNTER — Telehealth (INDEPENDENT_AMBULATORY_CARE_PROVIDER_SITE_OTHER): Payer: Self-pay

## 2020-09-27 NOTE — Telephone Encounter (Signed)
Marlowe Kays took note from phone call yesterday from Coralyn Pear, Phuc Lambs wife, wanting pt records sent to a neurologist. She did not give name of neuro. I addressed with Gay Filler. Gay Filler asked that I call Katharine Look back and have her to email Gay Filler a statement of that they want medical records sent to neuro. Instead of a medical release. I called Katharine Look today and left her a message and asked her to call me back and I need to know who or where to send records.

## 2020-10-18 ENCOUNTER — Encounter: Payer: Self-pay | Admitting: Neurology

## 2020-11-07 DIAGNOSIS — K219 Gastro-esophageal reflux disease without esophagitis: Secondary | ICD-10-CM | POA: Insufficient documentation

## 2020-11-07 DIAGNOSIS — R1310 Dysphagia, unspecified: Secondary | ICD-10-CM | POA: Insufficient documentation

## 2020-11-07 DIAGNOSIS — K5730 Diverticulosis of large intestine without perforation or abscess without bleeding: Secondary | ICD-10-CM | POA: Insufficient documentation

## 2020-12-13 ENCOUNTER — Other Ambulatory Visit (HOSPITAL_COMMUNITY): Payer: Self-pay | Admitting: Gastroenterology

## 2020-12-13 DIAGNOSIS — R110 Nausea: Secondary | ICD-10-CM

## 2020-12-26 NOTE — Progress Notes (Signed)
NEUROLOGY CONSULTATION NOTE  Adam Tyler MRN: [id number] DOB: [birth year]  Referring provider: Everardo Beals, NP Primary care provider: Everardo Beals, NP  Reason for consult:  dizziness  Assessment/Plan:   Dizziness - Unclear etiology.  Possibly mild dysautonomia (related to history of excess alcohol use) - he did have borderline positive orthostatic vitals.  Prior workup has ruled out posterior circulation stroke/vertebrobasilar disease or posterior fossa lesion and extracranial arteries reportedly unremarkable.  I do not have any further explanation.     Subjective:  Adam Tyler is a 65 year old male with diabetes, polycythemia, HTN, and alcohol abuse who presents for dizziness.  He is accompanied by his wife who supplements history.  PCP notes reviewed.  He started experiencing dizziness and brain fog ion 2016 following a staph infection in his leg.  He describes different types of dizziness.  Sometimes he just feels like he is floating and in a daze.  Other times, he feels lightheaded.  Almost off balance and sometimes will fall backwards.  Always worse on his feet.  Sometimes may feel spinning if laying supine in bed with eyes closed.  Also nauseous and has GI upset.  Symptoms also aggravated if riding in a car.  Needs to use a cane to ambulate.  He was evaluated by neurology in 2019.  He had MRI/MRA of the brain on 11/07/2017 which were personally reviewed and demonstrated no acute intracranial abnormality or vertebrobasilar insufficiency.  He saw ENT in 2020.  He reportedly had vestibular testing and was told to see neurology again.  He had a carotid ultrasound in April 2021 which was reportedly unremarkable.  Dizziness was at first episodic, occurring 3 days a week.  For over a year, it has been constant.  Patient has OSA and reports compliance with CPAP.  He has history of alcohol dependence.  Drinks 8 beers a day.  Diabetes is uncontrolled (A1c 8.4 last week).  PAST MEDICAL  HISTORY: Past Medical History:  Diagnosis Date   Acute medial meniscal injury of knee    left   Anxiety    h/o panic attacks from " stress"   Arthritis    Arthritis, septic, knee ([place])    Blood glucose elevated    Cancer ([place]) 2007   melanoma forehead    Diabetes mellitus without complication ([place])    [id number] 04/20/15 was 7.3    GERD (gastroesophageal reflux disease)    Hypertension    Infection    left knee   Sciatica of left side    per pt  "buldging disc- lumbar"   Sleep apnea    pt denies   Upper respiratory infection 05/17/13   no fever since 05/18/13    PAST SURGICAL HISTORY: Past Surgical History:  Procedure Laterality Date   CHONDROPLASTY Left 03/09/2015   Procedure: CHONDROPLASTY;  Surgeon: Earlie Server, MD;  Location: [place] [facility];  Service: Orthopedics;  Laterality: Left;   HAND SURGERY     HERNIA REPAIR     KNEE ARTHROSCOPY Left 03/18/2015   Procedure: ARTHROSCOPIC, LAVAGE, SYNOVECTOMY LEFT KNEE ;  Surgeon: Earlie Server, MD;  Location: [facility];  Service: Orthopedics;  Laterality: Left;   KNEE ARTHROSCOPY Left 03/23/2015   Procedure: LEFT KNEE I&D WITH LAVAGE;  Surgeon: Earlie Server, MD;  Location: [facility];  Service: Orthopedics;  Laterality: Left;   KNEE ARTHROSCOPY Left 04/22/2015   Procedure: LEFT KNEE ARTHROSCOPY WITH LAVAGE AND DRAINING WITH SYNOVECTOMY;  Surgeon: Earlie Server, MD;  Location:  [facility];  Service: Orthopedics;  Laterality: Left;   KNEE ARTHROSCOPY WITH MEDIAL MENISECTOMY Left 03/09/2015   Procedure: KNEE ARTHROSCOPY WITH  PARTIAL MEDIAL MENISECTOMY;  Surgeon: Earlie Server, MD;  Location: [facility];  Service: Orthopedics;  Laterality: Left;   MASS EXCISION Right 06/19/2013   Procedure: EXCISION MASS RIGHT POSTERIOR NECK;  Surgeon: Earnstine Regal, MD;  Location: [facility];  Service: General;  Laterality: Right;   SKIN GRAFT Right    TONSILLECTOMY      MEDICATIONS: Current Outpatient Medications on File  Prior to Visit  Medication Sig Dispense Refill   atorvastatin (LIPITOR) 10 MG tablet Take 10 mg by mouth daily.     dicyclomine (BENTYL) 20 MG tablet Take 20 mg by mouth every 6 (six) hours.     dimenhyDRINATE (DRAMAMINE) 50 MG tablet Take 50 mg by mouth every 8 (eight) hours as needed.     Dulaglutide (TRULICITY) 1.5 YW/7.3XT SOPN Inject 1.5 mg into the skin once a week.     EPINEPHrine 0.3 mg/0.3 mL IJ SOAJ injection Inject 0.3 mg into the skin as needed.     hydrochlorothiazide (HYDRODIURIL) 25 MG tablet Take 25 mg by mouth daily.     linagliptin (TRADJENTA) 5 MG TABS tablet Take 5 mg by mouth daily.     losartan-hydrochlorothiazide (HYZAAR) 100-12.5 MG per tablet Take 1 tablet by mouth daily.     ondansetron (ZOFRAN-ODT) 8 MG disintegrating tablet Take 8 mg by mouth as needed.     ONE TOUCH ULTRA TEST test strip      ONETOUCH DELICA LANCETS 06Y MISC CHECK BLOOD SUGAR TWICE A DAY  3   PENNSAID 2 % SOLN apply 2 pumps to affected area twice a day  0   venlafaxine XR (EFFEXOR-XR) 75 MG 24 hr capsule Take 75 mg by mouth daily after supper.   6   venlafaxine XR (EFFEXOR-XR) 75 MG 24 hr capsule Take 75 mg by mouth daily.     vitamin B-12 (CYANOCOBALAMIN) 1000 MCG tablet Take 5,000 mcg by mouth daily.     No current facility-administered medications on file prior to visit.    ALLERGIES: Allergies  Allergen Reactions   Strawberry Extract Hives and Nausea And Vomiting   Adhesive [Tape] Other (See Comments)    Leg raw and red, burning from adhesive bandage 03/18/15   No Known Allergies    Other Other (See Comments)    Leg raw and red, burning from adhesive bandage 03/18/15    FAMILY HISTORY: Family History  Problem Relation Age of Onset   Cancer Mother        lymphnode   Stroke Father     Objective:  Height 5\' 10"  (1.778 m), weight 194 lb 9.6 oz (88.3 kg). Orthostatic Vitals:Supine Sitting  Standing BP:       120/60 102/64  110/70 Pulse:     79 81  88   General: No acute distress.   Patient appears well-groomed.   Head:  Normocephalic/atraumatic Eyes:  fundi examined but not visualized Neck: supple, no paraspinal tenderness, full range of motion Back: No paraspinal tenderness Heart: regular rate and rhythm Lungs: Clear to auscultation bilaterally. Vascular: No carotid bruits. Neurological Exam: Mental status: alert and oriented to person, place, and time, recent and remote memory intact, fund of knowledge intact, attention and concentration intact, speech fluent and not dysarthric, language intact. Cranial nerves: CN I: not tested CN II: pupils equal, round and reactive to light, visual fields intact CN III,  IV, VI:  full range of motion, no nystagmus, no ptosis CN V: facial sensation intact. CN VII: upper and lower face symmetric CN VIII: hearing intact CN IX, X: gag intact, uvula midline CN XI: sternocleidomastoid and trapezius muscles intact CN XII: tongue midline Bulk & Tone: normal, no fasciculations. Motor:  muscle strength 5/5 throughout Sensation:  Pinprick, temperature and vibratory sensation intact. Deep Tendon Reflexes:  2+ throughout,  toes downgoing.   Finger to nose testing:  Without dysmetria.   Heel to shin:  Without dysmetria.   Gait:  Ataxic.    Thank you for allowing me to take part in the care of this patient.  Metta Clines, DO  CC:  Everardo Beals, NP

## 2020-12-27 ENCOUNTER — Ambulatory Visit: Payer: Medicare HMO | Admitting: Neurology

## 2020-12-27 ENCOUNTER — Other Ambulatory Visit: Payer: Self-pay

## 2020-12-27 ENCOUNTER — Encounter: Payer: Self-pay | Admitting: Neurology

## 2020-12-27 VITALS — Ht 70.0 in | Wt 194.6 lb

## 2020-12-27 DIAGNOSIS — R42 Dizziness and giddiness: Secondary | ICD-10-CM

## 2020-12-27 NOTE — Patient Instructions (Signed)
Unfortunately, I do not have a definite answer to your dizziness or any way to specifically cure it.  Neurologic workup has already been done.

## 2021-06-27 DIAGNOSIS — R42 Dizziness and giddiness: Secondary | ICD-10-CM | POA: Insufficient documentation

## 2022-02-23 ENCOUNTER — Ambulatory Visit: Payer: Medicare HMO | Admitting: Urology

## 2022-02-23 ENCOUNTER — Encounter: Payer: Self-pay | Admitting: Urology

## 2022-02-23 VITALS — BP 135/77 | HR 80 | Ht 70.0 in | Wt 195.0 lb

## 2022-02-23 DIAGNOSIS — N401 Enlarged prostate with lower urinary tract symptoms: Secondary | ICD-10-CM | POA: Diagnosis not present

## 2022-02-23 DIAGNOSIS — N138 Other obstructive and reflux uropathy: Secondary | ICD-10-CM | POA: Insufficient documentation

## 2022-02-23 DIAGNOSIS — N529 Male erectile dysfunction, unspecified: Secondary | ICD-10-CM | POA: Diagnosis not present

## 2022-02-23 DIAGNOSIS — R972 Elevated prostate specific antigen [PSA]: Secondary | ICD-10-CM | POA: Diagnosis not present

## 2022-02-23 LAB — URINALYSIS, ROUTINE W REFLEX MICROSCOPIC
Bilirubin, UA: NEGATIVE
Glucose, UA: NEGATIVE
Ketones, UA: NEGATIVE
Leukocytes,UA: NEGATIVE
Nitrite, UA: NEGATIVE
Protein,UA: NEGATIVE
RBC, UA: NEGATIVE
Specific Gravity, UA: 1.01 (ref 1.005–1.030)
Urobilinogen, Ur: 0.2 mg/dL (ref 0.2–1.0)
pH, UA: 7 (ref 5.0–7.5)

## 2022-02-23 MED ORDER — SILDENAFIL CITRATE 100 MG PO TABS: 100.0000 mg | ORAL_TABLET | Freq: Every day | ORAL | 3 refills | Status: DC | PRN

## 2022-02-23 NOTE — Progress Notes (Signed)
Assessment: 1. Elevated PSA; prior negative biopsy 2009   2. Organic impotence   3. BPH with obstruction/lower urinary tract symptoms    Plan: I reviewed the patient's records from [facility] and all available PSA results. Today I had a long discussion with the patient regarding PSA and the rationale and controversies of prostate cancer early detection.  I discussed the pros and cons of further evaluation including TRUS and prostate Bx.  Potential adverse events and complications as well as standard instructions were given.  Patient expressed his understanding of these issues. He has a long history of elevated PSA with a prior negative biopsy.  His recent PSAs have been within his prior PSA range. Iso PSA sent today - will call him with results  Rx for sildenafil 100 mg tabs prn sent  Chief Complaint:  Chief Complaint  Patient presents with   Elevated PSA    History of Present Illness:  Adam Tyler is a 66 y.o. male who is seen in consultation from Catalina Antigua, MD for evaluation of elevated PSA.  He has a long history of elevated PSA.  He was previously evaluated by Dr. Karsten Ro.  He underwent a prostate biopsy in December 2009.  Prostate volume measured 46 mL.  Pathology showed no evidence of malignancy.  He was last seen by Dr. Karsten Ro in October 2019.  PSA results: 8/23 6.11 4/22 8.6 7/21 6.6 10/20 6.72 with 24% free 10/19 5.25 5/18 3.9 4/16 6.03 4/15 6.72 4/14 5.57 4/13 4.71  He has a history of BPH with lower urinary tract symptoms.  His symptoms include urinary frequency, intermittent stream, and some hesitancy.  No dysuria or gross hematuria.  He is currently on tamsulosin. IPSS = 9 today. No history of UTIs or prostatitis.  He has a history of erectile dysfunction and has previously used sildenafil 100 mg with good results.  No side effects.   Past Medical History:  Past Medical History:  Diagnosis Date   Acute medial meniscal injury of knee    left    Anxiety    h/o panic attacks from " stress"   Arthritis    Arthritis, septic, knee ([place])    Blood glucose elevated    Cancer ([place]) 2007   melanoma forehead    Diabetes mellitus without complication ([place])    [id number] 04/20/15 was 7.3    GERD (gastroesophageal reflux disease)    Hypertension    Infection    left knee   Sciatica of left side    per pt  "buldging disc- lumbar"   Sleep apnea    pt denies   Upper respiratory infection 05/17/13   no fever since 05/18/13    Past Surgical History:  Past Surgical History:  Procedure Laterality Date   CHONDROPLASTY Left 03/09/2015   Procedure: CHONDROPLASTY;  Surgeon: Earlie Server, MD;  Location: [facility];  Service: Orthopedics;  Laterality: Left;   HAND SURGERY     HERNIA REPAIR     KNEE ARTHROSCOPY Left 03/18/2015   Procedure: ARTHROSCOPIC, LAVAGE, SYNOVECTOMY LEFT KNEE ;  Surgeon: Earlie Server, MD;  Location: [facility];  Service: Orthopedics;  Laterality: Left;   KNEE ARTHROSCOPY Left 03/23/2015   Procedure: LEFT KNEE I&D WITH LAVAGE;  Surgeon: Earlie Server, MD;  Location: [place];  Service: Orthopedics;  Laterality: Left;   KNEE ARTHROSCOPY Left 04/22/2015   Procedure: LEFT KNEE ARTHROSCOPY WITH LAVAGE AND DRAINING WITH SYNOVECTOMY;  Surgeon: Earlie Server, MD;  Location[facility];  Service: Orthopedics;  Laterality: Left;   KNEE ARTHROSCOPY WITH MEDIAL MENISECTOMY Left 03/09/2015   Procedure: KNEE ARTHROSCOPY WITH  PARTIAL MEDIAL MENISECTOMY;  Surgeon: Earlie Server, MD;  Location: [place];  Service: Orthopedics;  Laterality: Left;   MASS EXCISION Right 06/19/2013   Procedure: EXCISION MASS RIGHT POSTERIOR NECK;  Surgeon: Earnstine Regal, MD;  Location[facility];  Service: General;  Laterality: Right;   SKIN GRAFT Right    TONSILLECTOMY      Allergies:  Allergies  Allergen Reactions   Strawberry Extract Hives and Nausea And Vomiting   Adhesive [Tape] Other (See Comments)    Leg raw and  red, burning from adhesive bandage 03/18/15   No Known Allergies    Other Other (See Comments)    Leg raw and red, burning from adhesive bandage 03/18/15    Family History:  Family History  Problem Relation Age of Onset   Cancer Mother        lymphnode   Stroke Father     Social History:  Social History   Tobacco Use   Smoking status: Every Day    Packs/day: 0.20    Years: 20.00    Total pack years: 4.00    Types: Cigarettes   Smokeless tobacco: Never  Substance Use Topics   Alcohol use: Yes    Alcohol/week: 7.0 standard drinks of alcohol    Types: 7 Standard drinks or equivalent per week    Comment: pt said all he can / 3-4 beers night, only a beer a night now ( 04/28/15)   Drug use: Yes    Types: Marijuana    Comment: uses for pain in knees    Review of symptoms:  Constitutional:  Negative for unexplained weight loss, night sweats, fever, chills ENT:  Negative for nose bleeds, sinus pain, painful swallowing CV:  Negative for chest pain, shortness of breath, exercise intolerance, palpitations, loss of consciousness Resp:  Negative for cough, wheezing, shortness of breath GI:  Negative for nausea, vomiting, diarrhea, bloody stools GU:  Positives noted in HPI; otherwise negative for gross hematuria, dysuria, urinary incontinence Neuro:  Negative for seizures, poor balance, limb weakness, slurred speech Psych:  Negative for lack of energy, depression, anxiety Endocrine:  Negative for polydipsia, polyuria, symptoms of hypoglycemia (dizziness, hunger, sweating) Hematologic:  Negative for anemia, purpura, petechia, prolonged or excessive bleeding, use of anticoagulants  Allergic:  Negative for difficulty breathing or choking as a result of exposure to anything; no shellfish allergy; no allergic response (rash/itch) to materials, foods  Physical exam: BP 135/77   Pulse 80   Ht '5\' 10"'$  (1.778 m)   Wt 195 lb (88.5 kg)   BMI 27.98 kg/m  GENERAL APPEARANCE:  Well appearing,  well developed, well nourished, NAD HEENT: Atraumatic, Normocephalic, oropharynx clear. NECK: Supple without lymphadenopathy or thyromegaly. LUNGS: Clear to auscultation bilaterally. HEART: Regular Rate and Rhythm without murmurs, gallops, or rubs. ABDOMEN: Soft, non-tender, No Masses. EXTREMITIES: Moves all extremities well.  Without clubbing, cyanosis, or edema. NEUROLOGIC:  Alert and oriented x 3, normal gait, CN II-XII grossly intact.  MENTAL STATUS:  Appropriate. BACK:  Non-tender to palpation.  No CVAT SKIN:  Warm, dry and intact.   GU: Penis:  uncircumcised Meatus: Normal Scrotum: normal, small bilateral hydroceles Testis: normal without masses bilateral Epididymis: normal Prostate: 50 g, NT, no nodules Rectum: Normal tone,  no masses or tenderness   Results: U/A:  dipstick negative

## 2022-02-28 ENCOUNTER — Encounter: Payer: Self-pay | Admitting: Urology

## 2022-03-01 ENCOUNTER — Encounter: Payer: Self-pay | Admitting: Urology

## 2022-03-05 ENCOUNTER — Other Ambulatory Visit: Payer: Self-pay

## 2022-03-05 DIAGNOSIS — N529 Male erectile dysfunction, unspecified: Secondary | ICD-10-CM

## 2022-03-05 MED ORDER — SILDENAFIL CITRATE 100 MG PO TABS: 100.0000 mg | ORAL_TABLET | Freq: Every day | ORAL | 3 refills | Status: DC | PRN

## 2022-03-05 NOTE — Progress Notes (Signed)
Request sildenafil rx to be sent to [facility] in [place] due to insurance not covering medication.

## 2022-03-15 ENCOUNTER — Encounter: Payer: Self-pay | Admitting: Urology

## 2022-03-29 ENCOUNTER — Encounter: Payer: Self-pay | Admitting: Urology

## 2022-04-13 ENCOUNTER — Encounter: Payer: Self-pay | Admitting: Urology

## 2022-06-15 ENCOUNTER — Telehealth: Payer: Self-pay

## 2022-06-15 NOTE — Telephone Encounter (Signed)
Spoke with patient's wife and scheduled pt with Dr. Diona Fanti on 01/30.  Apt reminder letter mailed.

## 2022-06-15 NOTE — Telephone Encounter (Signed)
-----   Message from Primus Bravo, MD sent at 06/14/2022 12:13 PM EST ----- Please arrange for this patient to follow-up in [place] for elevated PSA.

## 2022-07-17 ENCOUNTER — Ambulatory Visit: Payer: Medicare HMO | Admitting: Urology

## 2023-04-10 DIAGNOSIS — J432 Centrilobular emphysema: Secondary | ICD-10-CM | POA: Insufficient documentation

## 2023-04-10 DIAGNOSIS — F17210 Nicotine dependence, cigarettes, uncomplicated: Secondary | ICD-10-CM | POA: Insufficient documentation

## 2023-04-10 DIAGNOSIS — F331 Major depressive disorder, recurrent, moderate: Secondary | ICD-10-CM | POA: Insufficient documentation

## 2023-04-10 DIAGNOSIS — Z9181 History of falling: Secondary | ICD-10-CM | POA: Insufficient documentation

## 2023-06-06 ENCOUNTER — Other Ambulatory Visit: Payer: Self-pay | Admitting: Urology

## 2023-06-06 DIAGNOSIS — K760 Fatty (change of) liver, not elsewhere classified: Secondary | ICD-10-CM | POA: Insufficient documentation

## 2023-06-06 DIAGNOSIS — R972 Elevated prostate specific antigen [PSA]: Secondary | ICD-10-CM

## 2023-06-06 DIAGNOSIS — N138 Other obstructive and reflux uropathy: Secondary | ICD-10-CM

## 2023-06-06 DIAGNOSIS — F1010 Alcohol abuse, uncomplicated: Secondary | ICD-10-CM | POA: Insufficient documentation

## 2023-06-06 DIAGNOSIS — Z860100 Personal history of colon polyps, unspecified: Secondary | ICD-10-CM | POA: Insufficient documentation

## 2023-06-06 NOTE — Progress Notes (Deleted)
Name: Adam Tyler DOB: [birth year] MRN: [id number]  History of Present Illness: Adam Tyler is a 67 y.o. male who presents today at [facility] .  ***He is accompanied by ***. - GU history: 1. BPH with LUTS (frequency, intermittent stream, hesitancy). - ***Taking / ***Previously took Flomax. 2. Elevated PSA. - Prior negative prostate biopsy in December 2009 at [facility].   3. Erectile dysfunction. - ***Taking / ***Previously took Sildenafil 100 mg PRN.   PSA values: - 09/2011: 4.71 - 09/2012: 5.57 - 09/2013: 6.72 - 09/2014: 6.03 - 10/2016: 3.9 - 03/2018: 5.25 - 03/2019: 6.72 with 24% free - 12/29/2019: 6.6 - 09/19/2020: 8.6 - 01/30/2022: 6.11 - 02/23/2022: 8.09 with IsoPSA score of 4.1  At last visit with Dr. Pete Glatter on 02/23/2022: - "His recent PSAs have been within his prior PSA range." - PSA = 8.09 with IsoPSA score of 4.1 suggesting a low risk of aggressive prostate cancer. - Rx for sildenafil 100 mg tabs prn sent  Since last visit: > 03/29/2022: Via MyChart message Dr. Pete Glatter advised pt to consider a prostate MRI for further evaluation. ("If the MRI is negative along with this iso PSA test, I think we can feel fairly confident that you do not have aggressive prostate cancer"). No response from patient.  > 04/10/2023: Normal renal function (creatinine 0.91, GFR >90).  > 04/18/2023: CT abdomen/pelvis w/ contrast at [facility] showed severe prostatic enlargement and a right scrotal hydrocele. No GU stones, masses, or hydronephrosis; bladder unremarkable.  > 05/15/2023: MRI abdomen w/wo contrast showed "Small, definitively benign left adrenal myelolipoma, for which no further follow-up or characterization is required. Kidneys are normal, without renal calculi, solid lesion, or hydronephrosis."  > 06/07/2023: PSA = 8.5.  Today: He reports ***  He reports *** urinary stream. He {Actions; denies-reports:120008} urinary hesitancy, urgency, frequency, dysuria,  gross hematuria, straining to void, or sensations of incomplete emptying.   Fall Screening: Do you usually have a device to assist in your mobility? {yes/no:20286} ***cane / ***walker / ***wheelchair   Medications: Current Outpatient Medications  Medication Sig Dispense Refill   albuterol (PROAIR HFA) 108 (90 Base) MCG/ACT inhaler Inhale 2 puffs every 4 hours by inhalation route as needed for 30 days.     atorvastatin (LIPITOR) 10 MG tablet Take 10 mg by mouth daily.     dicyclomine (BENTYL) 20 MG tablet Take 20 mg by mouth every 6 (six) hours.     dimenhyDRINATE (DRAMAMINE) 50 MG tablet Take 50 mg by mouth every 8 (eight) hours as needed.     Dulaglutide 1.5 MG/0.5ML SOPN Inject 1.5 mg into the skin once a week.     EPINEPHrine 0.3 mg/0.3 mL IJ SOAJ injection Inject 0.3 mg into the skin as needed.     FARXIGA 10 MG TABS tablet Take 10 mg by mouth daily. (Patient not taking: Reported on 02/23/2022)     hydrochlorothiazide (HYDRODIURIL) 25 MG tablet Take 25 mg by mouth daily.     linagliptin (TRADJENTA) 5 MG TABS tablet Take 5 mg by mouth daily.     losartan-hydrochlorothiazide (HYZAAR) 100-12.5 MG per tablet Take 1 tablet by mouth daily.     olmesartan (BENICAR) 40 MG tablet Take 40 mg by mouth daily.     ondansetron (ZOFRAN-ODT) 8 MG disintegrating tablet Take 8 mg by mouth as needed.     ONE TOUCH ULTRA TEST test strip      ONETOUCH DELICA LANCETS 33G MISC CHECK BLOOD SUGAR TWICE A DAY  3   pantoprazole (PROTONIX) 40 MG tablet Take 40 mg by mouth 2 (two) times daily.     PENNSAID 2 % SOLN   0   sildenafil (VIAGRA) 100 MG tablet Take 1 tablet (100 mg total) by mouth daily as needed for erectile dysfunction. 18 tablet 3   tamsulosin (FLOMAX) 0.4 MG CAPS capsule Take 0.4 mg by mouth daily.     venlafaxine XR (EFFEXOR-XR) 75 MG 24 hr capsule Take 75 mg by mouth daily after supper.   6   venlafaxine XR (EFFEXOR-XR) 75 MG 24 hr capsule Take 75 mg by mouth daily.     vitamin B-12  (CYANOCOBALAMIN) 1000 MCG tablet Take 5,000 mcg by mouth daily. (Patient not taking: Reported on 02/23/2022)     No current facility-administered medications for this visit.    Allergies: Allergies  Allergen Reactions   Strawberry Extract Hives and Nausea And Vomiting   Adhesive [Tape] Other (See Comments)    Leg raw and red, burning from adhesive bandage 03/18/15   No Known Allergies    Other Other (See Comments)    Leg raw and red, burning from adhesive bandage 03/18/15    Past Medical History:  Diagnosis Date   Acute medial meniscal injury of knee    left   Anxiety    h/o panic attacks from " stress"   Arthritis    Arthritis, septic, knee (HCC)    Blood glucose elevated    Cancer (HCC) 2007   melanoma forehead    Diabetes mellitus without complication (HCC)    HBA1c 04/20/15 was 7.3    GERD (gastroesophageal reflux disease)    Hypertension    Infection    left knee   Sciatica of left side    per pt  "buldging disc- lumbar"   Sleep apnea    pt denies   Upper respiratory infection 05/17/13   no fever since 05/18/13   Past Surgical History:  Procedure Laterality Date   CHONDROPLASTY Left 03/09/2015   Procedure: CHONDROPLASTY;  Surgeon: Frederico Hamman, MD;  Location: [facility];  Service: Orthopedics;  Laterality: Left;   HAND SURGERY     HERNIA REPAIR     KNEE ARTHROSCOPY Left 03/18/2015   Procedure: ARTHROSCOPIC, LAVAGE, SYNOVECTOMY LEFT KNEE ;  Surgeon: Frederico Hamman, MD;  Location: [facility];  Service: Orthopedics;  Laterality: Left;   KNEE ARTHROSCOPY Left 03/23/2015   Procedure: LEFT KNEE I&D WITH LAVAGE;  Surgeon: Frederico Hamman, MD;  Location: [facility];  Service: Orthopedics;  Laterality: Left;   KNEE ARTHROSCOPY Left 04/22/2015   Procedure: LEFT KNEE ARTHROSCOPY WITH LAVAGE AND DRAINING WITH SYNOVECTOMY;  Surgeon: Frederico Hamman, MD;  Location: [facility];  Service: Orthopedics;  Laterality: Left;   KNEE ARTHROSCOPY WITH MEDIAL MENISECTOMY  Left 03/09/2015   Procedure: KNEE ARTHROSCOPY WITH  PARTIAL MEDIAL MENISECTOMY;  Surgeon: Frederico Hamman, MD;  Location: [facility];  Service: Orthopedics;  Laterality: Left;   MASS EXCISION Right 06/19/2013   Procedure: EXCISION MASS RIGHT POSTERIOR NECK;  Surgeon: Velora Heckler, MD;  Location: [facility];  Service: General;  Laterality: Right;   SKIN GRAFT Right    TONSILLECTOMY     Family History  Problem Relation Age of Onset   Cancer Mother        lymphnode   Stroke Father    Social History   Socioeconomic History   Marital status: Married    Spouse name: Not on file   Number of  children: Not on file   Years of education: Not on file   Highest education level: Not on file  Occupational History   Not on file  Tobacco Use   Smoking status: Every Day    Current packs/day: 0.20    Average packs/day: 0.2 packs/day for 20.0 years (4.0 ttl pk-yrs)    Types: Cigarettes   Smokeless tobacco: Never  Substance and Sexual Activity   Alcohol use: Yes    Alcohol/week: 7.0 standard drinks of alcohol    Types: 7 Standard drinks or equivalent per week    Comment: pt said all he can / 3-4 beers night, only a beer a night now ( 04/28/15)   Drug use: Yes    Types: Marijuana    Comment: uses for pain in knees   Sexual activity: Yes  Other Topics Concern   Not on file  Social History Narrative   Not on file   Social Drivers of Health   Financial Resource Strain: Low Risk  (04/10/2023)   Received from [facility]   Overall Financial Resource Strain (CARDIA)    Difficulty of Paying Living Expenses: Not very hard  Food Insecurity: No Food Insecurity (04/10/2023)   Received from [facility]   Hunger Vital Sign    Worried About Running Out of Food in the Last Year: Never true    Ran Out of Food in the Last Year: Never true  Transportation Needs: No Transportation Needs (04/10/2023)   Received from [facility] - Transportation    Lack of Transportation  (Medical): No    Lack of Transportation (Non-Medical): No  Physical Activity: Inactive (04/10/2023)   Received from [facility]   Exercise Vital Sign    Days of Exercise per Week: 0 days    Minutes of Exercise per Session: 0 min  Stress: No Stress Concern Present (04/10/2023)   Received from [facility] - Occupational Stress Questionnaire    Feeling of Stress : Not at all  Social Connections: Socially Integrated (04/10/2023)   Received from [facility]   Social Connection and Isolation Panel [NHANES]    Frequency of Communication with Friends and Family: More than three times a week    Frequency of Social Gatherings with Friends and Family: More than three times a week    Attends Religious Services: 1 to 4 times per year    Active Member of [employer] or Organizations: No    Attends Engineer, structural: 1 to 4 times per year    Marital Status: Married  Catering manager Violence: Not At Risk (04/10/2023)   Received from [facility]   Humiliation, Afraid, Rape, and Kick questionnaire    Fear of Current or Ex-Partner: No    Emotionally Abused: No    Physically Abused: No    Sexually Abused: No    Review of Systems Constitutional: Patient denies any unintentional weight loss or change in strength lntegumentary: Patient denies any rashes or pruritus Cardiovascular: Patient denies chest pain or syncope Respiratory: Patient denies shortness of breath Gastrointestinal: Patient ***denies nausea, vomiting, constipation, or diarrhea Musculoskeletal: Patient denies muscle cramps or weakness Neurologic: Patient denies convulsions or seizures Allergic/Immunologic: Patient denies recent allergic reaction(s) Hematologic/Lymphatic: Patient denies bleeding tendencies Endocrine: Patient denies heat/cold intolerance  GU: As per HPI.  OBJECTIVE There were no vitals filed for this visit. There is no height or weight on file to  calculate  BMI.  Physical Examination Constitutional: No obvious distress; patient is non-toxic appearing  Cardiovascular: No visible lower extremity edema.  Respiratory: The patient does not have audible wheezing/stridor; respirations do not appear labored  Gastrointestinal: Abdomen non-distended Musculoskeletal: Normal ROM of UEs  Skin: No obvious rashes/open sores  Neurologic: CN 2-12 grossly intact Psychiatric: Answered questions appropriately with normal affect  Hematologic/Lymphatic/Immunologic: No obvious bruises or sites of spontaneous bleeding  Urine microscopy: ***negative *** WBC/hpf, *** RBC/hpf, *** bacteria UA: ***negative *** WBC/hpf, *** RBC/hpf, *** bacteria ***with no evidence of UTI ***with no evidence of microscopic hematuria ***otherwise unremarkable  PVR: *** ml  ASSESSMENT No diagnosis found. ***  Will plan for follow up in *** months / ***1 year or sooner if needed. Pt verbalized understanding and agreement. All questions were answered.  PLAN Advised the following: 1. *** 2. ***No follow-ups on file.  No orders of the defined types were placed in this encounter.   It has been explained that the patient is to follow regularly with their PCP in addition to all other providers involved in their care and to follow instructions provided by these respective offices. Patient advised to contact urology clinic if any urologic-pertaining questions, concerns, new symptoms or problems arise in the interim period.  There are no Patient Instructions on file for this visit.  Electronically signed by:  Donnita Falls, FNP   06/06/23    1:23 PM

## 2023-06-07 ENCOUNTER — Other Ambulatory Visit: Payer: Medicare HMO

## 2023-06-08 LAB — PSA: Prostate Specific Ag, Serum: 8.5 ng/mL — ABNORMAL HIGH (ref 0.0–4.0)

## 2023-06-17 ENCOUNTER — Ambulatory Visit: Payer: Medicare HMO | Admitting: Urology

## 2023-06-17 DIAGNOSIS — N529 Male erectile dysfunction, unspecified: Secondary | ICD-10-CM

## 2023-06-17 DIAGNOSIS — R972 Elevated prostate specific antigen [PSA]: Secondary | ICD-10-CM

## 2023-06-17 DIAGNOSIS — N138 Other obstructive and reflux uropathy: Secondary | ICD-10-CM

## 2023-07-02 NOTE — Progress Notes (Addendum)
Name: Adam Tyler DOB: 1956/05/29 MRN: [id number]  History of Present Illness: Adam Tyler is a 68 y.o. male who presents today at [facility]. He is accompanied by his wife Dois Davenport. - GU history: 1. BPH with LUTS (frequency, intermittent stream, hesitancy). - Taking took Flomax 0.4 mg daily. 2. Elevated PSA. - Prior negative prostate biopsy in December 2009 at [facility].   3. Erectile dysfunction. - Taking Sildenafil 100 mg PRN.   PSA values: - 09/2011: 4.71 - 09/2012: 5.57 - 09/2013: 6.72 - 09/2014: 6.03 - 10/2016: 3.9 - 03/2018: 5.25 - 03/2019: 6.72 with 24% free - 12/29/2019: 6.6 - 09/19/2020: 8.6 - 01/30/2022: 6.11 - 02/23/2022: 8.09 with IsoPSA score of 4.1  At last visit with Dr. Pete Glatter on 02/23/2022: - "His recent PSAs have been within his prior PSA range." - PSA = 8.09 with IsoPSA score of 4.1 suggesting a low risk of aggressive prostate cancer. - Rx for sildenafil 100 mg tabs prn sent.  Since last visit: > 03/29/2022: Via MyChart message Dr. Pete Glatter advised pt to consider a prostate MRI for further evaluation. ("If the MRI is negative along with this iso PSA test, I think we can feel fairly confident that you do not have aggressive prostate cancer"). No response from patient.  > 04/10/2023: Normal renal function (creatinine 0.91, GFR >90).  > 04/18/2023: CT abdomen/pelvis w/ contrast at [facility] showed severe prostatic enlargement and a right scrotal hydrocele. No GU stones, masses, or hydronephrosis; bladder unremarkable.  > 05/15/2023: MRI abdomen w/wo contrast showed "Small, definitively benign left adrenal myelolipoma, for which no further follow-up or characterization is required. Kidneys are normal, without renal calculi, solid lesion, or hydronephrosis."  > 06/07/2023: PSA = 8.5.  Today: He reports normal urinary stream. He denies urinary hesitancy, urgency, frequency, dysuria, gross hematuria, straining to void, or sensations of incomplete  emptying.  He reports right scrotal swelling for the past 4-6 weeks. It is tender at times, moreso towards the left side (most noticeable when sitting; has to reposition). Denies scrotal redness, warmth, rash, lesions, or pain.    Fall Screening: Do you usually have a device to assist in your mobility? No   Medications: Current Outpatient Medications  Medication Sig Dispense Refill   albuterol (PROAIR HFA) 108 (90 Base) MCG/ACT inhaler Inhale 2 puffs every 4 hours by inhalation route as needed for 30 days.     atorvastatin (LIPITOR) 10 MG tablet Take 10 mg by mouth daily.     diazepam (VALIUM) 2 MG tablet Take 1 tablet (2 mg total) by mouth once for 1 dose. Take 30-60 minutes prior to MRI for anxiety / claustrophobia. 1 tablet 0   dicyclomine (BENTYL) 20 MG tablet Take 20 mg by mouth every 6 (six) hours.     dimenhyDRINATE (DRAMAMINE) 50 MG tablet Take 50 mg by mouth every 8 (eight) hours as needed.     Dulaglutide 1.5 MG/0.5ML SOPN Inject 1.5 mg into the skin once a week.     EPINEPHrine 0.3 mg/0.3 mL IJ SOAJ injection Inject 0.3 mg into the skin as needed.     hydrochlorothiazide (HYDRODIURIL) 25 MG tablet Take 25 mg by mouth daily.     linagliptin (TRADJENTA) 5 MG TABS tablet Take 5 mg by mouth daily.     losartan-hydrochlorothiazide (HYZAAR) 100-12.5 MG per tablet Take 1 tablet by mouth daily.     olmesartan (BENICAR) 40 MG tablet Take 40 mg by mouth daily.     ondansetron (ZOFRAN-ODT) 8  MG disintegrating tablet Take 8 mg by mouth as needed.     ONE TOUCH ULTRA TEST test strip      ONETOUCH DELICA LANCETS 33G MISC CHECK BLOOD SUGAR TWICE A DAY  3   pantoprazole (PROTONIX) 40 MG tablet Take 40 mg by mouth 2 (two) times daily.     PENNSAID 2 % SOLN   0   sildenafil (VIAGRA) 100 MG tablet Take 1 tablet (100 mg total) by mouth daily as needed for erectile dysfunction. 18 tablet 3   sulfamethoxazole-trimethoprim (BACTRIM DS) 800-160 MG tablet Take 1 tablet by mouth 2 (two) times daily for  7 days. 14 tablet 0   venlafaxine XR (EFFEXOR-XR) 75 MG 24 hr capsule Take 75 mg by mouth daily after supper.   6   venlafaxine XR (EFFEXOR-XR) 75 MG 24 hr capsule Take 75 mg by mouth daily.     vitamin B-12 (CYANOCOBALAMIN) 1000 MCG tablet Take 5,000 mcg by mouth daily.     tamsulosin (FLOMAX) 0.4 MG CAPS capsule Take 1 capsule (0.4 mg total) by mouth daily. 30 capsule 11   No current facility-administered medications for this visit.    Allergies: Allergies  Allergen Reactions   Strawberry Extract Hives and Nausea And Vomiting   Adhesive [Tape] Other (See Comments)    Leg raw and red, burning from adhesive bandage 03/18/15   No Known Allergies    Other Other (See Comments)    Leg raw and red, burning from adhesive bandage 03/18/15    Past Medical History:  Diagnosis Date   Acute medial meniscal injury of knee    left   Anxiety    h/o panic attacks from " stress"   Arthritis    Arthritis, septic, knee (HCC)    Blood glucose elevated    Cancer (HCC) 2007   melanoma forehead    Diabetes mellitus without complication (HCC)    HBA1c 04/20/15 was 7.3    GERD (gastroesophageal reflux disease)    Hypertension    Infection    left knee   Sciatica of left side    per pt  "buldging disc- lumbar"   Sleep apnea    pt denies   Upper respiratory infection 05/17/13   no fever since 05/18/13   Past Surgical History:  Procedure Laterality Date   CHONDROPLASTY Left 03/09/2015   Procedure: CHONDROPLASTY;  Surgeon: Frederico Hamman, MD;  Location: [facility];  Service: Orthopedics;  Laterality: Left;   HAND SURGERY     HERNIA REPAIR     KNEE ARTHROSCOPY Left 03/18/2015   Procedure: ARTHROSCOPIC, LAVAGE, SYNOVECTOMY LEFT KNEE ;  Surgeon: Frederico Hamman, MD;  Location: [facility] OR;  Service: Orthopedics;  Laterality: Left;   KNEE ARTHROSCOPY Left 03/23/2015   Procedure: LEFT KNEE I&D WITH LAVAGE;  Surgeon: Frederico Hamman, MD;  Location: [facility];  Service: Orthopedics;   Laterality: Left;   KNEE ARTHROSCOPY Left 04/22/2015   Procedure: LEFT KNEE ARTHROSCOPY WITH LAVAGE AND DRAINING WITH SYNOVECTOMY;  Surgeon: Frederico Hamman, MD;  Location: [facility];  Service: Orthopedics;  Laterality: Left;   KNEE ARTHROSCOPY WITH MEDIAL MENISECTOMY Left 03/09/2015   Procedure: KNEE ARTHROSCOPY WITH  PARTIAL MEDIAL MENISECTOMY;  Surgeon: Frederico Hamman, MD;  Location:  SURGERY CENTER;  Service: Orthopedics;  Laterality: Left;   MASS EXCISION Right 06/19/2013   Procedure: EXCISION MASS RIGHT POSTERIOR NECK;  Surgeon: Velora Heckler, MD;  Location: [facility];  Service: General;  Laterality: Right;   SKIN GRAFT Right  TONSILLECTOMY     Family History  Problem Relation Age of Onset   Cancer Mother        lymphnode   Stroke Father    Social History   Socioeconomic History   Marital status: Married    Spouse name: Not on file   Number of children: Not on file   Years of education: Not on file   Highest education level: Not on file  Occupational History   Not on file  Tobacco Use   Smoking status: Every Day    Current packs/day: 0.20    Average packs/day: 0.2 packs/day for 20.0 years (4.0 ttl pk-yrs)    Types: Cigarettes   Smokeless tobacco: Never  Substance and Sexual Activity   Alcohol use: Yes    Alcohol/week: 7.0 standard drinks of alcohol    Types: 7 Standard drinks or equivalent per week    Comment: pt said all he can / 3-4 beers night, only a beer a night now ( 04/28/15)   Drug use: Yes    Types: Marijuana    Comment: uses for pain in knees   Sexual activity: Yes  Other Topics Concern   Not on file  Social History Narrative   Not on file   Social Drivers of Health   Financial Resource Strain: Low Risk  (04/10/2023)   Received from [facility]   Overall Financial Resource Strain (CARDIA)    Difficulty of Paying Living Expenses: Not very hard  Food Insecurity: No Food Insecurity (04/10/2023)   Received from [facility]   Hunger Vital Sign     Worried About Running Out of Food in the Last Year: Never true    Ran Out of Food in the Last Year: Never true  Transportation Needs: No Transportation Needs (04/10/2023)   Received from [facility] - Transportation    Lack of Transportation (Medical): No    Lack of Transportation (Non-Medical): No  Physical Activity: Inactive (04/10/2023)   Received from [facility]   Exercise Vital Sign    Days of Exercise per Week: 0 days    Minutes of Exercise per Session: 0 min  Stress: No Stress Concern Present (04/10/2023)   Received from [facility] - Occupational Stress Questionnaire    Feeling of Stress : Not at all  Social Connections: Socially Integrated (04/10/2023)   Received from [facility]   Social Connection and Isolation Panel [NHANES]    Frequency of Communication with Friends and Family: More than three times a week    Frequency of Social Gatherings with Friends and Family: More than three times a week    Attends Religious Services: 1 to 4 times per year    Active Member of [employer] or Organizations: No    Attends Engineer, structural: 1 to 4 times per year    Marital Status: Married  Catering manager Violence: Not At Risk (04/10/2023)   Received from [facility]   Humiliation, Afraid, Rape, and Kick questionnaire    Fear of Current or Ex-Partner: No    Emotionally Abused: No    Physically Abused: No    Sexually Abused: No    Review of Systems Constitutional: Patient denies any unintentional weight loss or change in strength lntegumentary: Patient denies any rashes or pruritus Cardiovascular: Patient denies chest pain or syncope Respiratory: Patient denies shortness of breath Gastrointestinal: Patient denies nausea, vomiting, constipation, or diarrhea  Musculoskeletal: Patient denies muscle cramps or weakness Neurologic: Patient denies convulsions or seizures Allergic/Immunologic: Patient  denies recent allergic reaction(s) Hematologic/Lymphatic: Patient denies bleeding tendencies Endocrine: Patient denies heat/cold intolerance  GU: As per HPI.  OBJECTIVE Vitals:   07/12/23 1237  BP: (!) 147/82  Pulse: 76  Temp: 97.8 F (36.6 C)   There is no height or weight on file to calculate BMI.  Physical Examination Constitutional: No obvious distress; patient is non-toxic appearing  Cardiovascular: No visible lower extremity edema.  Respiratory: The patient does not have audible wheezing/stridor; respirations do not appear labored  Gastrointestinal: Abdomen non-distended Musculoskeletal: Normal ROM of UEs  Skin: No obvious rashes/open sores  Neurologic: CN 2-12 grossly intact Psychiatric: Answered questions appropriately with normal affect  Hematologic/Lymphatic/Immunologic: No obvious bruises or sites of spontaneous bleeding  Genitourinary: Edema and induration to right hemiscrotum cross the midline. Unable to palpate either testicle. No erythema, warmth, rash, or lesions. Minimal tenderness to palpation.  Pelvic exam was chaperoned by patient's wife.  Urine microscopy: negative  PVR: 80 ml  ASSESSMENT BPH with obstruction/lower urinary tract symptoms - Plan: Urinalysis, Routine w reflex microscopic, BLADDER SCAN AMB NON-IMAGING, diazepam (VALIUM) 2 MG tablet, MR PROSTATE W WO CONTRAST, tamsulosin (FLOMAX) 0.4 MG CAPS capsule  Organic impotence  Elevated PSA - Plan: diazepam (VALIUM) 2 MG tablet, MR PROSTATE W WO CONTRAST  Scrotal edema - Plan: sulfamethoxazole-trimethoprim (BACTRIM DS) 800-160 MG tablet, US SCROTUM W/DOPPLER  Right hydrocele - Plan: sulfamethoxazole-trimethoprim (BACTRIM DS) 800-160 MG tablet, US SCROTUM W/DOPPLER  1. BPH with LUTS (frequency, intermittent stream, hesitancy). Well managed with Flomax 0.4 mg daily. Refills sent.  2. Elevated PSA. We reviewed PSA history and prior workup. Advised MRI prostate for further evaluation, to which he  agreed. For anxiety/claustrophobia he was prescribed Valium 2 mg x1 dose to take prior.  3. Erectile dysfunction. OK to continue Sildenafil 100 mg PRN.   4. Right orchitis. We discussed CT evidence of right hydrocele. Today however he appears to have right orchitis. We agreed to treat with Bactrim and proceed with scrotal / testicular ultrasound for further evaluation.   Will plan for follow up in 2 weeks for recheck of scrotal edema / infection or sooner if needed. Pt verbalized understanding and agreement. All questions were answered.  PLAN Advised the following: 1. Bactrim DS 2x/day x7 days. 2. Scrotal / testicular ultrasound next week.  3. MRI prostate.  4. Continue Flomax 0.4 mg daily.  5. Viagra 2 mg x1 for claustrophobia re: MRI. 6. Return in about 2 weeks (around 07/26/2023) for UA, PVR, & f/u with Evette Georges NP.  Orders Placed This Encounter  Procedures   MR PROSTATE W WO CONTRAST    Prescription sent for 1 dose of Valium 2 mg for patient to take prior to MRI due to anxiety / claustrophobia.    Standing Status:   Future    Expiration Date:   07/11/2024    If indicated for the ordered procedure, I authorize the administration of contrast media per Radiology protocol:   Yes    What is the patient's sedation requirement?:   Anti-anxiety    Does the patient have a pacemaker or implanted devices?:   Yes    Manufacturer of pacemake or implanted device?:   metal implant in right hand; had a MRI at [facility] in November 2024 with no problem    Preferred imaging location?:   [facility] (table limit - 550lbs)   US SCROTUM W/DOPPLER  Standing Status:   Future    Expected Date:   07/13/2023    Expiration Date:   07/11/2024    Reason for Exam (SYMPTOM  OR DIAGNOSIS REQUIRED):   right scrotal edema    Preferred imaging location?:   [facility]   Urinalysis, Routine w reflex microscopic   BLADDER SCAN AMB NON-IMAGING   Total time spent caring for the patient today was over  40 minutes. This includes time spent on the date of the visit reviewing the patient's chart before the visit, time spent during the visit, and time spent after the visit on documentation. Over 50% of that time was spent in face-to-face time with this patient for direct counseling. E&M based on time and complexity of medical decision making.  It has been explained that the patient is to follow regularly with their PCP in addition to all other providers involved in their care and to follow instructions provided by these respective offices. Patient advised to contact urology clinic if any urologic-pertaining questions, concerns, new symptoms or problems arise in the interim period.  There are no Patient Instructions on file for this visit.  Electronically signed by:  Donnita Falls, FNP   07/12/23    1:19 PM

## 2023-07-12 ENCOUNTER — Encounter: Payer: Self-pay | Admitting: Urology

## 2023-07-12 ENCOUNTER — Ambulatory Visit (INDEPENDENT_AMBULATORY_CARE_PROVIDER_SITE_OTHER): Payer: Medicare HMO | Admitting: Urology

## 2023-07-12 VITALS — BP 147/82 | HR 76 | Temp 97.8°F

## 2023-07-12 DIAGNOSIS — R972 Elevated prostate specific antigen [PSA]: Secondary | ICD-10-CM | POA: Diagnosis not present

## 2023-07-12 DIAGNOSIS — N529 Male erectile dysfunction, unspecified: Secondary | ICD-10-CM

## 2023-07-12 DIAGNOSIS — N433 Hydrocele, unspecified: Secondary | ICD-10-CM

## 2023-07-12 DIAGNOSIS — N401 Enlarged prostate with lower urinary tract symptoms: Secondary | ICD-10-CM

## 2023-07-12 DIAGNOSIS — N138 Other obstructive and reflux uropathy: Secondary | ICD-10-CM

## 2023-07-12 DIAGNOSIS — N5089 Other specified disorders of the male genital organs: Secondary | ICD-10-CM | POA: Diagnosis not present

## 2023-07-12 LAB — URINALYSIS, ROUTINE W REFLEX MICROSCOPIC
Bilirubin, UA: NEGATIVE
Ketones, UA: NEGATIVE
Leukocytes,UA: NEGATIVE
Nitrite, UA: NEGATIVE
Protein,UA: NEGATIVE
RBC, UA: NEGATIVE
Specific Gravity, UA: 1.015 (ref 1.005–1.030)
Urobilinogen, Ur: 0.2 mg/dL (ref 0.2–1.0)
pH, UA: 6 (ref 5.0–7.5)

## 2023-07-12 MED ORDER — SULFAMETHOXAZOLE-TRIMETHOPRIM 800-160 MG PO TABS: 1.0000 | ORAL_TABLET | Freq: Two times a day (BID) | ORAL | 0 refills | Status: AC

## 2023-07-12 MED ORDER — DIAZEPAM 2 MG PO TABS: 2.0000 mg | ORAL_TABLET | Freq: Once | ORAL | 0 refills | Status: AC

## 2023-07-12 MED ORDER — TAMSULOSIN HCL 0.4 MG PO CAPS: 0.4000 mg | ORAL_CAPSULE | Freq: Every day | ORAL | 11 refills | Status: DC

## 2023-07-12 NOTE — Progress Notes (Signed)
PVR

## 2023-07-16 ENCOUNTER — Telehealth: Payer: Self-pay

## 2023-07-16 ENCOUNTER — Ambulatory Visit (HOSPITAL_COMMUNITY)
Admission: RE | Admit: 2023-07-16 | Discharge: 2023-07-16 | Disposition: A | Discharge status: Home / Self Care | Payer: Medicare HMO | Source: Ambulatory Visit | Attending: Urology | Admitting: Urology

## 2023-07-16 DIAGNOSIS — N433 Hydrocele, unspecified: Secondary | ICD-10-CM | POA: Diagnosis present

## 2023-07-16 DIAGNOSIS — N401 Enlarged prostate with lower urinary tract symptoms: Secondary | ICD-10-CM | POA: Insufficient documentation

## 2023-07-16 DIAGNOSIS — R972 Elevated prostate specific antigen [PSA]: Secondary | ICD-10-CM | POA: Diagnosis present

## 2023-07-16 DIAGNOSIS — N138 Other obstructive and reflux uropathy: Secondary | ICD-10-CM | POA: Diagnosis present

## 2023-07-16 DIAGNOSIS — N5089 Other specified disorders of the male genital organs: Secondary | ICD-10-CM | POA: Insufficient documentation

## 2023-07-16 MED ORDER — GADOBUTROL 1 MMOL/ML IV SOLN
9.0000 mL | Freq: Once | INTRAVENOUS | Status: AC | PRN
  Administered 2023-07-16: 9 mL via INTRAVENOUS

## 2023-07-16 NOTE — Telephone Encounter (Signed)
Patient's wife Dois Davenport was made aware and voiced understanding.

## 2023-07-16 NOTE — Telephone Encounter (Signed)
-----   Message from Adam Tyler sent at 07/16/2023 11:18 AM EST ----- Please let pt know his scrotal / testicular ultrasound showed no acute findings. He has bilateral hydroceles - larger on right side than left side. Advised to follow up as planned.

## 2023-07-17 ENCOUNTER — Telehealth: Payer: Self-pay

## 2023-07-17 NOTE — Telephone Encounter (Signed)
Cover my meds Pending [id number]

## 2023-07-22 ENCOUNTER — Telehealth: Payer: Self-pay

## 2023-07-22 NOTE — Telephone Encounter (Signed)
-----   Message from Nurse Jill Side sent at 07/15/2023 10:25 AM EST ----- [email or web address]

## 2023-07-22 NOTE — Telephone Encounter (Signed)
 OPEN IN ERROR

## 2023-07-22 NOTE — Telephone Encounter (Signed)
Cover my Meds Key: [id number] (APPROVED)

## 2023-07-30 DIAGNOSIS — N433 Hydrocele, unspecified: Secondary | ICD-10-CM | POA: Insufficient documentation

## 2023-07-30 NOTE — Progress Notes (Unsigned)
Name: Adam Tyler DOB: 1955/08/29 MRN: [id number]  History of Present Illness: Adam Tyler is a 68 y.o. male who presents today for follow up visit at [facility] [place]. He is accompanied by his wife Adam Tyler. - GU history: 1. BPH with LUTS (frequency, intermittent stream, hesitancy). - Taking took Flomax 0.4 mg daily. 2. Elevated PSA. - Prior negative prostate biopsy in December 2009 at [facility].   3. Bilateral hydroceles. 4. Erectile dysfunction. - Taking Sildenafil 100 mg PRN.    PSA values: - 09/2011: 4.71 - 09/2012: 5.57 - 09/2013: 6.72 - 09/2014: 6.03 - 10/2016: 3.9 - 03/2018: 5.25 - 03/2019: 6.72 with 24% free - 12/29/2019: 6.6 - 09/19/2020: 8.6 - 01/30/2022: 6.11 - 02/23/2022: 8.09 with IsoPSA score of 4.1 - 06/07/2023: 8.5  Per chart review: > 04/10/2023: Normal renal function (creatinine 0.91, GFR >90).   > 04/18/2023: CT abdomen/pelvis w/ contrast at [facility] showed severe prostatic enlargement and a right scrotal hydrocele. No GU stones, masses, or hydronephrosis; bladder unremarkable.   > 05/15/2023: MRI abdomen w/wo contrast showed "Small, definitively benign left adrenal myelolipoma, for which no further follow-up or characterization is required. Kidneys are normal, without renal calculi, solid lesion, or hydronephrosis."  At last visit on 07/12/2023: - Denies LUTS. - Reported right scrotal swelling x4-6 weeks. It is tender at times, moreso towards the left side (most noticeable when sitting; has to reposition). Denies scrotal redness, warmth, rash, lesions, or pain.  - Advised the following: 1. Bactrim DS 2x/day x7 days for right orchitis. 2. Scrotal / testicular ultrasound next week.  3. MRI prostate.  4. Continue Flomax 0.4 mg daily. 5. Valium 2 mg x1 for claustrophobia re: MRI. 6. Return in about 2 weeks (around 07/26/2023) for UA, PVR, & f/u with Adam Georges NP.  Since last visit: 07/16/2023: > Scrotal / testicular ultrasound showed no acute  findings. He has bilateral hydroceles (R > L). > MRI prostate showed no evidence of prostate cancer.   Today: He reports intermittent urinary stream with terminal straining to empty bladder completely. Denies urinary urgency, frequency, nocturia, dysuria, gross hematuria, or hesitancy.  He reports persistent scrotal tenderness / sensitivity. Denies any noticeable change with taking Bactrim course after last visit. Denies scrotal redness or warmth. Denies recent fevers.    Fall Screening: Do you usually have a device to assist in your mobility? No   Medications: Current Outpatient Medications  Medication Sig Dispense Refill   albuterol (PROAIR HFA) 108 (90 Base) MCG/ACT inhaler Inhale 2 puffs every 4 hours by inhalation route as needed for 30 days.     atorvastatin (LIPITOR) 10 MG tablet Take 10 mg by mouth daily.     dicyclomine (BENTYL) 20 MG tablet Take 20 mg by mouth every 6 (six) hours.     dimenhyDRINATE (DRAMAMINE) 50 MG tablet Take 50 mg by mouth every 8 (eight) hours as needed.     Dulaglutide 1.5 MG/0.5ML SOPN Inject 1.5 mg into the skin once a week.     EPINEPHrine 0.3 mg/0.3 mL IJ SOAJ injection Inject 0.3 mg into the skin as needed.     hydrochlorothiazide (HYDRODIURIL) 25 MG tablet Take 25 mg by mouth daily.     linagliptin (TRADJENTA) 5 MG TABS tablet Take 5 mg by mouth daily.     losartan-hydrochlorothiazide (HYZAAR) 100-12.5 MG per tablet Take 1 tablet by mouth daily.     olmesartan (BENICAR) 40 MG tablet Take 40 mg by mouth daily.     ondansetron (ZOFRAN-ODT)  8 MG disintegrating tablet Take 8 mg by mouth as needed.     ONE TOUCH ULTRA TEST test strip      ONETOUCH DELICA LANCETS 33G MISC CHECK BLOOD SUGAR TWICE A DAY  3   pantoprazole (PROTONIX) 40 MG tablet Take 40 mg by mouth 2 (two) times daily.     PENNSAID 2 % SOLN   0   sildenafil (VIAGRA) 100 MG tablet Take 1 tablet (100 mg total) by mouth daily as needed for erectile dysfunction. 18 tablet 3   tamsulosin  (FLOMAX) 0.4 MG CAPS capsule Take 1 capsule (0.4 mg total) by mouth daily. 30 capsule 11   venlafaxine XR (EFFEXOR-XR) 75 MG 24 hr capsule Take 75 mg by mouth daily after supper.   6   venlafaxine XR (EFFEXOR-XR) 75 MG 24 hr capsule Take 75 mg by mouth daily.     vitamin B-12 (CYANOCOBALAMIN) 1000 MCG tablet Take 5,000 mcg by mouth daily.     No current facility-administered medications for this visit.    Allergies: Allergies  Allergen Reactions   Strawberry Extract Hives and Nausea And Vomiting   Adhesive [Tape] Other (See Comments)    Leg raw and red, burning from adhesive bandage 03/18/15   No Known Allergies    Other Other (See Comments)    Leg raw and red, burning from adhesive bandage 03/18/15    Past Medical History:  Diagnosis Date   Acute medial meniscal injury of knee    left   Anxiety    h/o panic attacks from " stress"   Arthritis    Arthritis, septic, knee (HCC)    Blood glucose elevated    Cancer (HCC) 2007   melanoma forehead    Diabetes mellitus without complication (HCC)    HBA1c 04/20/15 was 7.3    GERD (gastroesophageal reflux disease)    Hypertension    Infection    left knee   Sciatica of left side    per pt  "buldging disc- lumbar"   Sleep apnea    pt denies   Upper respiratory infection 05/17/13   no fever since 05/18/13   Past Surgical History:  Procedure Laterality Date   CHONDROPLASTY Left 03/09/2015   Procedure: CHONDROPLASTY;  Surgeon: Frederico Hamman, MD;  Location: [facility];  Service: Orthopedics;  Laterality: Left;   HAND SURGERY     HERNIA REPAIR     KNEE ARTHROSCOPY Left 03/18/2015   Procedure: ARTHROSCOPIC, LAVAGE, SYNOVECTOMY LEFT KNEE ;  Surgeon: Frederico Hamman, MD;  Location: [facility] OR;  Service: Orthopedics;  Laterality: Left;   KNEE ARTHROSCOPY Left 03/23/2015   Procedure: LEFT KNEE I&D WITH LAVAGE;  Surgeon: Frederico Hamman, MD;  Location: [facility];  Service: Orthopedics;  Laterality: Left;   KNEE  ARTHROSCOPY Left 04/22/2015   Procedure: LEFT KNEE ARTHROSCOPY WITH LAVAGE AND DRAINING WITH SYNOVECTOMY;  Surgeon: Frederico Hamman, MD;  Location: [facility];  Service: Orthopedics;  Laterality: Left;   KNEE ARTHROSCOPY WITH MEDIAL MENISECTOMY Left 03/09/2015   Procedure: KNEE ARTHROSCOPY WITH  PARTIAL MEDIAL MENISECTOMY;  Surgeon: Frederico Hamman, MD;  Location: [facility];  Service: Orthopedics;  Laterality: Left;   MASS EXCISION Right 06/19/2013   Procedure: EXCISION MASS RIGHT POSTERIOR NECK;  Surgeon: Velora Heckler, MD;  Location: [facility];  Service: General;  Laterality: Right;   SKIN GRAFT Right    TONSILLECTOMY     Family History  Problem Relation Age of Onset   Cancer Mother  lymphnode   Stroke Father    Social History   Socioeconomic History   Marital status: Married    Spouse name: Not on file   Number of children: Not on file   Years of education: Not on file   Highest education level: Not on file  Occupational History   Not on file  Tobacco Use   Smoking status: Every Day    Current packs/day: 0.20    Average packs/day: 0.2 packs/day for 20.0 years (4.0 ttl pk-yrs)    Types: Cigarettes   Smokeless tobacco: Never  Substance and Sexual Activity   Alcohol use: Yes    Alcohol/week: 7.0 standard drinks of alcohol    Types: 7 Standard drinks or equivalent per week    Comment: pt said all he can / 3-4 beers night, only a beer a night now ( 04/28/15)   Drug use: Yes    Types: Marijuana    Comment: uses for pain in knees   Sexual activity: Yes  Other Topics Concern   Not on file  Social History Narrative   Not on file   Social Drivers of Health   Financial Resource Strain: Low Risk  (04/10/2023)   Received [facility]   Overall Financial Resource Strain (CARDIA)    Difficulty of Paying Living Expenses: Not very hard  Food Insecurity: No Food Insecurity (04/10/2023)   Received from [facility]   Hunger Vital Sign    Worried About Running  Out of Food in the Last Year: Never true    Ran Out of Food in the Last Year: Never true  Transportation Needs: No Transportation Needs (04/10/2023)   Received from [facility] - Transportation    Lack of Transportation (Medical): No    Lack of Transportation (Non-Medical): No  Physical Activity: Inactive (04/10/2023)   Received from [facility]   Exercise Vital Sign    Days of Exercise per Week: 0 days    Minutes of Exercise per Session: 0 min  Stress: No Stress Concern Present (04/10/2023)   Received from [facility] - Occupational Stress Questionnaire    Feeling of Stress : Not at all  Social Connections: Socially Integrated (04/10/2023)   Received from [facility]   Social Connection and Isolation Panel [NHANES]    Frequency of Communication with Friends and Family: More than three times a week    Frequency of Social Gatherings with Friends and Family: More than three times a week    Attends Religious Services: 1 to 4 times per year    Active Member of [employer] or Organizations: No    Attends Engineer, structural: 1 to 4 times per year    Marital Status: Married  Catering manager Violence: Not At Risk (04/10/2023)   Received from [facility]   Humiliation, Afraid, Rape, and Kick questionnaire    Fear of Current or Ex-Partner: No    Emotionally Abused: No    Physically Abused: No    Sexually Abused: No    Review of Systems Constitutional: Patient denies any unintentional weight loss or change in strength lntegumentary: Patient denies any rashes or pruritus Cardiovascular: Patient denies chest pain or syncope Respiratory: Patient denies shortness of breath Gastrointestinal: Patient denies nausea, vomiting, constipation, or diarrhea Musculoskeletal: Patient denies muscle cramps or weakness Neurologic: Patient reports chronic dizziness Hematologic/Lymphatic: Patient denies bleeding  tendencies Endocrine: Patient denies heat/cold intolerance  GU: As per HPI.  OBJECTIVE Vitals:   07/31/23 1140  BP: (!) 148/84  Pulse: 78   There is no height or weight on file to calculate BMI.  Physical Examination Constitutional: No obvious distress; patient is non-toxic appearing  Cardiovascular: No visible lower extremity edema.  Respiratory: The patient does not have audible wheezing/stridor; respirations do not appear labored  Gastrointestinal: Abdomen non-distended Musculoskeletal: Normal ROM of UEs  Skin: No obvious rashes/open sores  Neurologic: CN 2-12 grossly intact Psychiatric: Answered questions appropriately with normal affect  Hematologic/Lymphatic/Immunologic: No obvious bruises or sites of spontaneous bleeding  UA: negative for leukocytes, blood, nitrites. Trace protein & 1+ glucosuria.  PVR: 82 ml  ASSESSMENT BPH with obstruction/lower urinary tract symptoms - Plan: Urinalysis, Routine w reflex microscopic, BLADDER SCAN AMB NON-IMAGING, PSA  Elevated PSA - Plan: PSA  Organic impotence  Bilateral hydrocele  We reviewed recent imaging results. No acute findings.   For bilateral hydroceles: Advised scrotal support throughout the day with supportive underwear and/or jock strap and scrotal elevation when at rest for comfort and to promote drainage.   For BPH: Will continue Flomax.  For ED: Can continue Viagra PRN.  Will plan for follow up in 3 months with Dr. Ronne Binning with PSA recheck and possible surgical consultation for hydrocelectomy if symptoms become more bothersome. Patient verbalized understanding of and agreement with current plan. All questions were answered.  PLAN Advised the following: 1. Continue Flomax (Tamsulosin) 0.4 mg daily for BPH. 2. Viagra (Sildenafil) 100 mg PRN for ED. 3. Return in about 3 months (around 10/28/2023) for f/u with Dr. Ronne Binning with PSA prior; possible surgical consultation re: symptomatic hydrocele.  Orders Placed  This Encounter  Procedures   Urinalysis, Routine w reflex microscopic   PSA    Standing Status:   Future    Expected Date:   10/28/2023    Expiration Date:   07/30/2024   BLADDER SCAN AMB NON-IMAGING   Total time spent caring for the patient today was over 30 minutes. This includes time spent on the date of the visit reviewing the patient's chart before the visit, time spent during the visit, and time spent after the visit on documentation. Over 50% of that time was spent in face-to-face time with this patient for direct counseling. E&M based on time and complexity of medical decision making.  It has been explained that the patient is to follow regularly with their PCP in addition to all other providers involved in their care and to follow instructions provided by these respective offices. Patient advised to contact urology clinic if any urologic-pertaining questions, concerns, new symptoms or problems arise in the interim period.  There are no Patient Instructions on file for this visit.  Electronically signed by:  Donnita Falls, FNP   07/31/23    12:24 PM

## 2023-07-31 ENCOUNTER — Encounter: Payer: Self-pay | Admitting: Urology

## 2023-07-31 ENCOUNTER — Ambulatory Visit (INDEPENDENT_AMBULATORY_CARE_PROVIDER_SITE_OTHER): Payer: Medicare HMO | Admitting: Urology

## 2023-07-31 VITALS — BP 148/84 | HR 78

## 2023-07-31 DIAGNOSIS — N401 Enlarged prostate with lower urinary tract symptoms: Secondary | ICD-10-CM

## 2023-07-31 DIAGNOSIS — R972 Elevated prostate specific antigen [PSA]: Secondary | ICD-10-CM | POA: Diagnosis not present

## 2023-07-31 DIAGNOSIS — N138 Other obstructive and reflux uropathy: Secondary | ICD-10-CM

## 2023-07-31 DIAGNOSIS — N433 Hydrocele, unspecified: Secondary | ICD-10-CM

## 2023-07-31 DIAGNOSIS — N529 Male erectile dysfunction, unspecified: Secondary | ICD-10-CM

## 2023-07-31 LAB — URINALYSIS, ROUTINE W REFLEX MICROSCOPIC
Bilirubin, UA: NEGATIVE
Leukocytes,UA: NEGATIVE
Nitrite, UA: NEGATIVE
RBC, UA: NEGATIVE
Specific Gravity, UA: 1.02 (ref 1.005–1.030)
Urobilinogen, Ur: 0.2 mg/dL (ref 0.2–1.0)
pH, UA: 6 (ref 5.0–7.5)

## 2023-07-31 NOTE — Progress Notes (Signed)
Bladder Scan completed today.  Patient can void prior to the bladder scan. Bladder scan result: 82  Performed By: Alfonse Spruce. CMA  Additional notes-

## 2023-08-27 ENCOUNTER — Other Ambulatory Visit: Payer: Self-pay

## 2023-08-27 ENCOUNTER — Emergency Department (HOSPITAL_COMMUNITY)

## 2023-08-27 ENCOUNTER — Emergency Department (HOSPITAL_COMMUNITY)
Admission: EM | Admit: 2023-08-27 | Discharge: 2023-08-28 | Disposition: A | Discharge status: Home / Self Care | Attending: Emergency Medicine | Admitting: Emergency Medicine

## 2023-08-27 DIAGNOSIS — S42402A Unspecified fracture of lower end of left humerus, initial encounter for closed fracture: Secondary | ICD-10-CM

## 2023-08-27 DIAGNOSIS — S59902A Unspecified injury of left elbow, initial encounter: Secondary | ICD-10-CM | POA: Diagnosis present

## 2023-08-27 DIAGNOSIS — Z23 Encounter for immunization: Secondary | ICD-10-CM | POA: Insufficient documentation

## 2023-08-27 DIAGNOSIS — W1839XA Other fall on same level, initial encounter: Secondary | ICD-10-CM | POA: Diagnosis not present

## 2023-08-27 DIAGNOSIS — S62102A Fracture of unspecified carpal bone, left wrist, initial encounter for closed fracture: Secondary | ICD-10-CM

## 2023-08-27 DIAGNOSIS — S53105A Unspecified dislocation of left ulnohumeral joint, initial encounter: Secondary | ICD-10-CM

## 2023-08-27 DIAGNOSIS — S53125A Posterior dislocation of left ulnohumeral joint, initial encounter: Secondary | ICD-10-CM | POA: Diagnosis not present

## 2023-08-27 DIAGNOSIS — S53025A Posterior dislocation of left radial head, initial encounter: Secondary | ICD-10-CM | POA: Diagnosis not present

## 2023-08-27 DIAGNOSIS — S52125A Nondisplaced fracture of head of left radius, initial encounter for closed fracture: Secondary | ICD-10-CM | POA: Diagnosis not present

## 2023-08-27 MED ORDER — PROPOFOL 10 MG/ML IV BOLUS
0.5000 mg/kg | Freq: Once | INTRAVENOUS | Status: DC
  Filled 2023-08-27: qty 20

## 2023-08-27 MED ORDER — TETANUS-DIPHTH-ACELL PERTUSSIS 5-2.5-18.5 LF-MCG/0.5 IM SUSY
0.5000 mL | PREFILLED_SYRINGE | Freq: Once | INTRAMUSCULAR | Status: AC
  Administered 2023-08-28: 0.5 mL via INTRAMUSCULAR
  Filled 2023-08-27: qty 0.5

## 2023-08-27 MED ORDER — PROPOFOL 10 MG/ML IV BOLUS
100.0000 mg | Freq: Once | INTRAVENOUS | Status: AC
  Administered 2023-08-27: 100 mg via INTRAVENOUS

## 2023-08-27 MED ORDER — HYDROMORPHONE HCL 1 MG/ML IJ SOLN
1.0000 mg | Freq: Once | INTRAMUSCULAR | Status: AC
  Administered 2023-08-27: 1 mg via INTRAVENOUS
  Filled 2023-08-27: qty 1

## 2023-08-27 MED ORDER — PROPOFOL 10 MG/ML IV BOLUS: 90.0000 mg | Freq: Once | INTRAVENOUS | Status: DC

## 2023-08-27 NOTE — ED Triage Notes (Signed)
 Pt states he fell of his porch landing on his left side. Pt unable to lift left arm, has multiple laceration to right hand, bleeding controlled at this time. Denies hitting head, no LOC. 10/10 pain.

## 2023-08-27 NOTE — Progress Notes (Signed)
 Patient came into ED after having a fall with dislocated elbow and possible fractured wrist.  Conscious sedation done with capnography.  Patient did well through procedure with levels within normal range during procedure.  RN at bedside with patient as well as radiology.

## 2023-08-27 NOTE — ED Provider Notes (Signed)
 [place] EMERGENCY DEPARTMENT AT [facility] Provider Note   CSN: [id number] Arrival date & time: 08/27/23  1939     History {Add pertinent medical, surgical, social history, OB history to HPI:1} Chief Complaint  Patient presents with   Fall   Arm Injury    Adam Tyler is a 68 y.o. male.  HPI    68 year old male comes in with chief complaint of mechanical fall.  Patient has a history of vertigo.  He indicates that he fell on his porch, landing on his left side.  In the process, he has injured his left upper extremity and he has severe pain over the elbow, wrist.  Patient also has bleeding from his hand.  He is unsure about his tetanus status.  Home Medications Prior to Admission medications   Medication Sig Start Date End Date Taking? Authorizing Provider  albuterol (PROAIR HFA) 108 (90 Base) MCG/ACT inhaler Inhale 2 puffs every 4 hours by inhalation route as needed for 30 days. 10/23/13   [provider]  atorvastatin (LIPITOR) 10 MG tablet Take 10 mg by mouth daily. 08/24/19   [provider]  dicyclomine (BENTYL) 20 MG tablet Take 20 mg by mouth every 6 (six) hours.    [provider]  dimenhyDRINATE (DRAMAMINE) 50 MG tablet Take 50 mg by mouth every 8 (eight) hours as needed.    [provider]  Dulaglutide 1.5 MG/0.5ML SOPN Inject 1.5 mg into the skin once a week.    [provider]  EPINEPHrine 0.3 mg/0.3 mL IJ SOAJ injection Inject 0.3 mg into the skin as needed.    [provider]  hydrochlorothiazide (HYDRODIURIL) 25 MG tablet Take 25 mg by mouth daily. 08/12/19   [provider]  linagliptin (TRADJENTA) 5 MG TABS tablet Take 5 mg by mouth daily.    [provider]  losartan-hydrochlorothiazide (HYZAAR) 100-12.5 MG per tablet Take 1 tablet by mouth daily.    [provider]  olmesartan (BENICAR) 40 MG tablet Take 40 mg by mouth daily. 12/03/20   [provider]  ondansetron  (ZOFRAN-ODT) 8 MG disintegrating tablet Take 8 mg by mouth as needed.    [provider]  ONE TOUCH ULTRA TEST test strip  06/03/16   [provider]  Dola Argyle LANCETS 33G MISC CHECK BLOOD SUGAR TWICE A DAY 08/12/17   [provider]  pantoprazole (PROTONIX) 40 MG tablet Take 40 mg by mouth 2 (two) times daily. 12/23/20   [provider]  PENNSAID 2 % SOLN  07/14/16   [provider]  sildenafil (VIAGRA) 100 MG tablet Take 1 tablet (100 mg total) by mouth daily as needed for erectile dysfunction. 03/05/22   Stoneking, Danford Bad., MD  tamsulosin (FLOMAX) 0.4 MG CAPS capsule Take 1 capsule (0.4 mg total) by mouth daily. 07/12/23   Donnita Falls, FNP  venlafaxine XR (EFFEXOR-XR) 75 MG 24 hr capsule Take 75 mg by mouth daily after supper.  02/08/15   [provider]  venlafaxine XR (EFFEXOR-XR) 75 MG 24 hr capsule Take 75 mg by mouth daily.    [provider]  vitamin B-12 (CYANOCOBALAMIN) 1000 MCG tablet Take 5,000 mcg by mouth daily.    [provider]      Allergies    Strawberry extract, Adhesive [tape], No known allergies, and Other    Review of Systems   Review of Systems  All other systems reviewed and are negative.   Physical Exam Updated Vital  Signs BP (!) 173/95   Pulse 80   Temp 98.6 F (37 C) (Oral)   Resp 20   Wt 90.6 kg   SpO2 94%   BMI 28.65 kg/m  Physical Exam Vitals and nursing note reviewed.  Constitutional:      General: He is in acute distress.     Appearance: He is well-developed.  HENT:     Head: Atraumatic.  Cardiovascular:     Rate and Rhythm: Normal rate.  Pulmonary:     Effort: Pulmonary effort is normal.  Musculoskeletal:        General: Swelling, tenderness and deformity present.     Cervical back: Neck supple.     Comments: Patient has tenderness over the left elbow, edema over the left hand/wrist. Patient also has superficial laceration over the palmar aspect of the left  hand  Skin:    General: Skin is warm.  Neurological:     Mental Status: He is alert and oriented to person, place, and time.     Comments: Numbness over the hand in its entirety dorsally. Able to abduct, adduct and flex and extend all the digits     ED Results / Procedures / Treatments   Labs (all labs ordered are listed, but only abnormal results are displayed) Labs Reviewed - No data to display  EKG None  Radiology DG Humerus Left Result Date: 08/27/2023 CLINICAL DATA:  Larey Seat off porch EXAM: LEFT HUMERUS - 2+ VIEW COMPARISON:  None Available. FINDINGS: No visible fracture or malalignment involving the humerus. Posterior dislocation of the olecranon and radius with respect to the distal humerus. Acute nondisplaced fracture involving the radial head. Probable fracture deformity of the olecranon on the AP view. IMPRESSION: Posterior dislocation of the elbow. Nondisplaced radial head fracture and suspected fracture deformity of the olecranon, suggest dedicated views of the elbow for further assessment Electronically Signed   By: Jasmine Pang M.D.   On: 08/27/2023 22:01    Procedures .Reduction of dislocation  Date/Time: 08/27/2023 11:04 PM  Performed by: Derwood Kaplan, MD Authorized by: Derwood Kaplan, MD  Consent: The procedure was performed in an emergent situation. Written consent obtained. Risks and benefits: risks, benefits and alternatives were discussed Consent given by: patient and spouse Patient understanding: patient states understanding of the procedure being performed Patient consent: the patient's understanding of the procedure matches consent given Procedure consent: procedure consent matches procedure scheduled Relevant documents: relevant documents present and verified Test results: test results available and properly labeled Site marked: the operative site was marked Imaging studies: imaging studies available Patient identity confirmed: arm band Time out:  Immediately prior to procedure a "time out" was called to verify the correct patient, procedure, equipment, support staff and site/side marked as required. Preparation: Patient was prepped and draped in the usual sterile fashion. Local anesthesia used: no  Anesthesia: Local anesthesia used: no Patient tolerance: patient tolerated the procedure well with no immediate complications Comments: Elbow has fracture and dislocation, reduction #1 -improved alignment, but reduction not complete   .Sedation  Date/Time: 08/27/2023 10:35 PM  Performed by: Derwood Kaplan, MD Authorized by: Derwood Kaplan, MD   Consent:    Consent obtained:  Written   Consent given by:  Patient   Risks discussed:  Allergic reaction, prolonged sedation necessitating reversal, dysrhythmia, prolonged hypoxia resulting in organ damage, respiratory compromise necessitating ventilatory assistance and intubation, inadequate sedation, nausea and vomiting Universal protocol:    Procedure explained and questions answered to patient or proxy's satisfaction: yes  Immediately prior to procedure, a time out was called: yes     Patient identity confirmed:  Arm band Indications:    Procedure performed:  Fracture reduction   Procedure necessitating sedation performed by:  Physician performing sedation Pre-sedation assessment:    Time since last food or drink:  8 hours   ASA classification: class 3 - patient with severe systemic disease     Mouth opening:  3 or more finger widths   Thyromental distance:  4 finger widths   Mallampati score:  III - soft palate, base of uvula visible   Neck mobility: normal     Pre-sedation assessments completed and reviewed: airway patency, cardiovascular function, hydration status, mental status, nausea/vomiting, pain level, respiratory function and temperature   A pre-sedation assessment was completed prior to the start of the procedure Immediate pre-procedure details:    Reassessment:  Patient reassessed immediately prior to procedure     Reviewed: vital signs, relevant labs/tests and NPO status     Verified: bag valve mask available, emergency equipment available, intubation equipment available, IV patency confirmed and oxygen available   Procedure details (see MAR for exact dosages):    Preoxygenation:  Nasal cannula   Sedation:  Propofol   Intended level of sedation: deep   Intra-procedure monitoring:  Blood pressure monitoring, frequent vital sign checks, frequent LOC assessments, continuous pulse oximetry, continuous capnometry and cardiac monitor   Intra-procedure events: none     Total Provider sedation time (minutes):  24 Post-procedure details:   A post-sedation assessment was completed following the completion of the procedure.   Attendance: Constant attendance by certified staff until patient recovered     Recovery: Patient returned to pre-procedure baseline     Post-sedation assessments completed and reviewed: airway patency, cardiovascular function, mental status, nausea/vomiting, pain level, respiratory function and temperature     Patient is stable for discharge or admission: yes     Procedure completion:  Tolerated well, no immediate complications   {Document cardiac monitor, telemetry assessment procedure when appropriate:1}  Medications Ordered in ED Medications  propofol (DIPRIVAN) 10 mg/mL bolus/IV push 45.3 mg (has no administration in time range)  HYDROmorphone (DILAUDID) injection 1 mg (1 mg Intravenous Given 08/27/23 2042)    ED Course/ Medical Decision Making/ A&P   {   Click here for ABCD2, HEART and other calculatorsREFRESH Note before signing :1}                              Medical Decision Making Amount and/or Complexity of Data Reviewed Radiology: ordered.  Risk Prescription drug management.     Final Clinical Impression(s) / ED Diagnoses Final diagnoses:  None    Rx / DC Orders ED Discharge Orders     None

## 2023-08-28 ENCOUNTER — Emergency Department (HOSPITAL_COMMUNITY)

## 2023-08-28 DIAGNOSIS — S62245A Nondisplaced fracture of shaft of first metacarpal bone, left hand, initial encounter for closed fracture: Secondary | ICD-10-CM

## 2023-08-28 DIAGNOSIS — S52532A Colles' fracture of left radius, initial encounter for closed fracture: Secondary | ICD-10-CM

## 2023-08-28 DIAGNOSIS — S53125A Posterior dislocation of left ulnohumeral joint, initial encounter: Secondary | ICD-10-CM

## 2023-08-28 MED ORDER — PROPOFOL 10 MG/ML IV BOLUS
0.5000 mg/kg | Freq: Once | INTRAVENOUS | Status: AC
  Administered 2023-08-28: 45.3 mg via INTRAVENOUS
  Filled 2023-08-28: qty 20

## 2023-08-28 MED ORDER — TETANUS-DIPHTH-ACELL PERTUSSIS 5-2.5-18.5 LF-MCG/0.5 IM SUSY: 0.5000 mL | PREFILLED_SYRINGE | Freq: Once | INTRAMUSCULAR | Status: DC

## 2023-08-28 MED ORDER — PROPOFOL 10 MG/ML IV BOLUS
100.0000 mg | Freq: Once | INTRAVENOUS | Status: AC
  Administered 2023-08-28: 100 mg via INTRAVENOUS

## 2023-08-28 MED ORDER — PROPOFOL 10 MG/ML IV BOLUS
40.0000 mg | Freq: Once | INTRAVENOUS | Status: AC
  Administered 2023-08-27: 40 mg via INTRAVENOUS

## 2023-08-28 MED ORDER — PROPOFOL 10 MG/ML IV BOLUS
INTRAVENOUS | Status: AC | PRN
  Administered 2023-08-28 (×2): 30 mg via INTRAVENOUS
  Administered 2023-08-28: 45 mg via INTRAVENOUS

## 2023-08-28 MED ORDER — CEFAZOLIN SODIUM-DEXTROSE 2-4 GM/100ML-% IV SOLN
2.0000 g | Freq: Once | INTRAVENOUS | Status: AC
  Administered 2023-08-28: 2 g via INTRAVENOUS
  Filled 2023-08-28: qty 100

## 2023-08-28 MED ORDER — FENTANYL CITRATE (PF) 100 MCG/2ML IJ SOLN
100.0000 ug | Freq: Once | INTRAMUSCULAR | Status: AC
  Administered 2023-08-28: 100 ug via INTRAVENOUS
  Filled 2023-08-28: qty 2

## 2023-08-28 MED ORDER — CEPHALEXIN 500 MG PO CAPS
500.0000 mg | ORAL_CAPSULE | Freq: Two times a day (BID) | ORAL | 0 refills | Status: AC
Start: 2023-08-28 — End: ?

## 2023-08-28 MED ORDER — HYDROCODONE-ACETAMINOPHEN 5-325 MG PO TABS: 1.0000 | ORAL_TABLET | Freq: Four times a day (QID) | ORAL | 0 refills | Status: DC | PRN

## 2023-08-28 NOTE — Sedation Documentation (Signed)
 Xray bedside.

## 2023-08-28 NOTE — Sedation Documentation (Addendum)
 Dr. Burt Knack at bedside

## 2023-08-28 NOTE — Progress Notes (Signed)
 Patient underwent another conscious sedation for reduction attempt of left elbow.  Patient tolerated procedure well.

## 2023-08-28 NOTE — ED Provider Notes (Signed)
 Patient feels back to baseline.  He is able to walk without any new symptoms.  No vomiting.  He is neurovascular intact after the splint. Patient safer discharge home, follow-up with orthopedics   Zadie Rhine, MD 08/28/23 [phone]

## 2023-08-28 NOTE — ED Provider Notes (Signed)
.  Sedation  Date/Time: 08/28/2023 1:20 AM  Performed by: Zadie Rhine, MD Authorized by: Zadie Rhine, MD   Consent:    Consent obtained:  Written   Consent given by:  Patient Universal protocol:    Immediately prior to procedure, a time out was called: yes   Pre-sedation assessment:    Time since last food or drink:  8   ASA classification: class 3 - patient with severe systemic disease     Mallampati score:  III - soft palate, base of uvula visible   Neck mobility: normal     Pre-sedation assessments completed and reviewed: airway patency and mental status   A pre-sedation assessment was completed prior to the start of the procedure Immediate pre-procedure details:    Reviewed: vital signs, relevant labs/tests and NPO status     Verified: bag valve mask available and oxygen available   Procedure details (see MAR for exact dosages):    Preoxygenation:  Nasal cannula   Sedation:  Propofol   Intended level of sedation: deep   Intra-procedure monitoring:  Cardiac monitor and blood pressure monitoring   Intra-procedure events: none     Total Provider sedation time (minutes):  20 Post-procedure details:   A post-sedation assessment was completed following the completion of the procedure.   Attendance: Constant attendance by certified staff until patient recovered     Recovery: Patient returned to pre-procedure baseline     Post-sedation assessments completed and reviewed: mental status and respiratory function     Patient is stable for discharge or admission: yes     Procedure completion:  Tolerated well, no immediate complications I assumed care of patient from Dr. Rhunette Croft.  Patient already had 2 unsuccessful sedation and reduction attempts for his left elbow dislocation. Sedation was done in conjunction with Dr. Dallas Schimke with Orthopedics. Reduction was successful by Dr. Dallas Schimke and splint applied by him Patient tolerated sedation well.  Patient will need to be monitored.   Will also start antibiotics and give tetanus booster    Zadie Rhine, MD 08/28/23 [phone]

## 2023-08-28 NOTE — Consult Note (Signed)
 ORTHOPAEDIC CONSULTATION  REQUESTING PHYSICIAN: Zadie Rhine, MD  ASSESSMENT AND PLAN: 68 y.o. male with the following:  Left elbow dislocation Left distal radius fracture Left 1st metacarpal fracture   Required a closed reduction in the emergency department.  Please see below for more details.  He was placed in a double sugar-tong splint, to maintain stability of the elbow, as well as the distal radius fracture.  The fracture of the first metacarpal is nondisplaced.  This is amenable to nonoperative management.  The elbow will have to be monitored closely.  Ultimately, I think given the extent of his injuries, the distal radius fracture will likely require open reduction and internal fixation.  This can be coordinated through clinic.  I will see him in clinic within the next week.   - Weight Bearing Status/Activity: Non weight bearing LUE.  Keep splint clean, dry and intact.  Sling for comfort.   - Additional recommended labs/tests: None  -VTE Prophylaxis: As needed  - Pain control: As needed  - Follow-up plan: Within 1 week  -Procedures: Closed reduction of the left elbow dislocation  Procedure -left elbow dislocation  The emergency department provided conscious sedation. Once adequate analgesia was confirmed, the left elbow was manipulated. The radial head, as well as the olecranon was palpable.  Using traction and countertraction, as well as manipulation of the radial head, there was a palpable clunk. At this point, the range of motion of the elbow with more fluid. The left elbow was stable from 30-100 degrees. Bedside fluoroscopy confirmed the elbow had been reduced. A well-padded double sugar-tong splint was placed on the left upper extremity. He remained neurovascularly intact.  Chief Complaint: Left arm pain  HPI: Adam Tyler is a 68 y.o. male with past medical history as listed below.  He states he has chronic vertigo.  He was at home, earlier in the day.  He  lost his balance and fell off his porch.  He had immediate pain.  He is unable to move his left elbow.  He is right-hand dominant.  No issues with his left elbow prior.  The emergency department attempted multiple times to complete a closed reduction.  Ultimately, I was consulted for additional assistance.  Upon my evaluation, he did have some lacerations to the hand, as well as the left thumb.  There is a superficial abrasion over the volar aspect of the left forearm.  The left elbow was swollen.  He was not complaining of any numbness or tingling.  He did not hit his head.  Past Medical History:  Diagnosis Date   Acute medial meniscal injury of knee    left   Anxiety    h/o panic attacks from " stress"   Arthritis    Arthritis, septic, knee (HCC)    Blood glucose elevated    Cancer (HCC) 2007   melanoma forehead    Diabetes mellitus without complication (HCC)    HBA1c 04/20/15 was 7.3    GERD (gastroesophageal reflux disease)    Hypertension    Infection    left knee   Sciatica of left side    per pt  "buldging disc- lumbar"   Sleep apnea    pt denies   Upper respiratory infection 05/17/13   no fever since 05/18/13   Past Surgical History:  Procedure Laterality Date   CHONDROPLASTY Left 03/09/2015   Procedure: CHONDROPLASTY;  Surgeon: Frederico Hamman, MD;  Location: [facility];  Service: Orthopedics;  Laterality: Left;  HAND SURGERY     HERNIA REPAIR     KNEE ARTHROSCOPY Left 03/18/2015   Procedure: ARTHROSCOPIC, LAVAGE, SYNOVECTOMY LEFT KNEE ;  Surgeon: Frederico Hamman, MD;  Location: [facility] OR;  Service: Orthopedics;  Laterality: Left;   KNEE ARTHROSCOPY Left 03/23/2015   Procedure: LEFT KNEE I&D WITH LAVAGE;  Surgeon: Frederico Hamman, MD;  Location: [facility];  Service: Orthopedics;  Laterality: Left;   KNEE ARTHROSCOPY Left 04/22/2015   Procedure: LEFT KNEE ARTHROSCOPY WITH LAVAGE AND DRAINING WITH SYNOVECTOMY;  Surgeon: Frederico Hamman, MD;  Location: [facility]  OR;  Service: Orthopedics;  Laterality: Left;   KNEE ARTHROSCOPY WITH MEDIAL MENISECTOMY Left 03/09/2015   Procedure: KNEE ARTHROSCOPY WITH  PARTIAL MEDIAL MENISECTOMY;  Surgeon: Frederico Hamman, MD;  Location: [facility];  Service: Orthopedics;  Laterality: Left;   MASS EXCISION Right 06/19/2013   Procedure: EXCISION MASS RIGHT POSTERIOR NECK;  Surgeon: Velora Heckler, MD;  Location: [facility];  Service: General;  Laterality: Right;   SKIN GRAFT Right    TONSILLECTOMY     Social History   Socioeconomic History   Marital status: Married    Spouse name: Not on file   Number of children: Not on file   Years of education: Not on file   Highest education level: Not on file  Occupational History   Not on file  Tobacco Use   Smoking status: Every Day    Current packs/day: 0.20    Average packs/day: 0.2 packs/day for 20.0 years (4.0 ttl pk-yrs)    Types: Cigarettes   Smokeless tobacco: Never  Substance and Sexual Activity   Alcohol use: Yes    Alcohol/week: 7.0 standard drinks of alcohol    Types: 7 Standard drinks or equivalent per week    Comment: pt said all he can / 3-4 beers night, only a beer a night now ( 04/28/15)   Drug use: Yes    Types: Marijuana    Comment: uses for pain in knees   Sexual activity: Yes  Other Topics Concern   Not on file  Social History Narrative   Not on file   Social Drivers of Health   Financial Resource Strain: Low Risk  (04/10/2023)   Received from [facility]   Overall Financial Resource Strain (CARDIA)    Difficulty of Paying Living Expenses: Not very hard  Food Insecurity: No Food Insecurity (04/10/2023)   [facility]   Hunger Vital Sign    Worried About Running Out of Food in the Last Year: Never true    Ran Out of Food in the Last Year: Never true  Transportation Needs: No Transportation Needs (04/10/2023)   Received from [facility] - Transportation    Lack of Transportation (Medical): No     Lack of Transportation (Non-Medical): No  Physical Activity: Inactive (04/10/2023)   Received from [facility]   Exercise Vital Sign    Days of Exercise per Week: 0 days    Minutes of Exercise per Session: 0 min  Stress: No Stress Concern Present (04/10/2023)   Received from [facility] - Occupational Stress Questionnaire    Feeling of Stress : Not at all  Social Connections: Socially Integrated (04/10/2023)   Received from [facility]   Social Connection and Isolation Panel [NHANES]    Frequency of Communication with Friends and Family: More than three times a week  Frequency of Social Gatherings with Friends and Family: More than three times a week    Attends Religious Services: 1 to 4 times per year    Active Member of [employer] or Organizations: No    Attends Engineer, structural: 1 to 4 times per year    Marital Status: Married   Family History  Problem Relation Age of Onset   Cancer Mother        lymphnode   Stroke Father    Allergies  Allergen Reactions   Strawberry Extract Hives and Nausea And Vomiting   Adhesive [Tape] Other (See Comments)    Leg raw and red, burning from adhesive bandage 03/18/15   No Known Allergies    Other Other (See Comments)    Leg raw and red, burning from adhesive bandage 03/18/15   Prior to Admission medications   Medication Sig Start Date End Date Taking? Authorizing Provider  albuterol (PROAIR HFA) 108 (90 Base) MCG/ACT inhaler Inhale 2 puffs every 4 hours by inhalation route as needed for 30 days. 10/23/13   [provider]  atorvastatin (LIPITOR) 10 MG tablet Take 10 mg by mouth daily. 08/24/19   [provider]  dicyclomine (BENTYL) 20 MG tablet Take 20 mg by mouth every 6 (six) hours.    [provider]  dimenhyDRINATE (DRAMAMINE) 50 MG tablet Take 50 mg by mouth every 8 (eight) hours as needed.    [provider]  Dulaglutide 1.5 MG/0.5ML  SOPN Inject 1.5 mg into the skin once a week.    [provider]  EPINEPHrine 0.3 mg/0.3 mL IJ SOAJ injection Inject 0.3 mg into the skin as needed.    [provider]  hydrochlorothiazide (HYDRODIURIL) 25 MG tablet Take 25 mg by mouth daily. 08/12/19   [provider]  linagliptin (TRADJENTA) 5 MG TABS tablet Take 5 mg by mouth daily.    [provider]  losartan-hydrochlorothiazide (HYZAAR) 100-12.5 MG per tablet Take 1 tablet by mouth daily.    [provider]  olmesartan (BENICAR) 40 MG tablet Take 40 mg by mouth daily. 12/03/20   [provider]  ondansetron (ZOFRAN-ODT) 8 MG disintegrating tablet Take 8 mg by mouth as needed.    [provider]  ONE TOUCH ULTRA TEST test strip  06/03/16   [provider]  Dola Argyle LANCETS 33G MISC CHECK BLOOD SUGAR TWICE A DAY 08/12/17   [provider]  pantoprazole (PROTONIX) 40 MG tablet Take 40 mg by mouth 2 (two) times daily. 12/23/20   [provider]  PENNSAID 2 % SOLN  07/14/16   [provider]  sildenafil (VIAGRA) 100 MG tablet Take 1 tablet (100 mg total) by mouth daily as needed for erectile dysfunction. 03/05/22   Stoneking, Danford Bad., MD  tamsulosin (FLOMAX) 0.4 MG CAPS capsule Take 1 capsule (0.4 mg total) by mouth daily. 07/12/23   Donnita Falls, FNP  venlafaxine XR (EFFEXOR-XR) 75 MG 24 hr capsule Take 75 mg by mouth daily after supper.  02/08/15   [provider]  venlafaxine XR (EFFEXOR-XR) 75 MG 24 hr capsule Take 75 mg by mouth daily.    [provider]  vitamin B-12 (CYANOCOBALAMIN) 1000 MCG tablet Take 5,000 mcg by mouth daily.    [provider]   DG Humerus Left Result Date: 08/27/2023 CLINICAL DATA:  Larey Seat off porch EXAM: LEFT HUMERUS - 2+ VIEW COMPARISON:  None Available. FINDINGS: No visible fracture or malalignment involving the humerus. Posterior dislocation  of the olecranon and radius with respect to the  distal humerus. Acute nondisplaced fracture involving the radial head. Probable fracture deformity of the olecranon on the AP view. IMPRESSION: Posterior dislocation of the elbow. Nondisplaced radial head fracture and suspected fracture deformity of the olecranon, suggest dedicated views of the elbow for further assessment Electronically Signed   By: Jasmine Pang M.D.   On: 08/27/2023 22:01   Family History Reviewed and non-contributory, no pertinent history of problems with bleeding or anesthesia    Review of Systems No fevers or chills No numbness or tingling No chest pain No shortness of breath No bowel or bladder dysfunction No GI distress No headaches + Dizzy    OBJECTIVE  Vitals:Patient Vitals for the past 8 hrs:  BP Temp Temp src Pulse Resp SpO2 Weight  08/28/23 0015 (!) 147/83 -- -- 84 15 96 % --  08/28/23 0006 (!) 152/91 -- -- 84 20 98 % --  08/27/23 2355 110/73 -- -- 84 20 96 % --  08/27/23 2354 131/66 -- -- 78 (!) 1 97 % --  08/27/23 2345 132/82 -- -- 83 14 98 % --  08/27/23 2330 137/79 -- -- 82 (!) 25 95 % --  08/27/23 2315 (!) 145/73 -- -- 75 16 98 % --  08/27/23 2239 (!) 173/95 -- -- 80 20 94 % --  08/27/23 2237 120/88 -- -- 80 15 97 % --  08/27/23 2235 133/73 -- -- 79 20 99 % --  08/27/23 2230 133/73 -- -- 73 19 97 % --  08/27/23 2204 -- -- -- -- -- -- 90.6 kg  08/27/23 2050 127/76 -- -- 67 14 96 % --  08/27/23 1952 94/75 98.6 F (37 C) Oral 61 16 94 % --   General: Alert, no acute distress Cardiovascular: Warm extremities noted Respiratory: No cyanosis, no use of accessory musculature GI: No organomegaly, abdomen is soft and non-tender Skin: No lesions in the area of chief complaint other than those listed below in MSK exam.  Neurologic: Sensation intact distally save for the below mentioned MSK exam Psychiatric: Patient is competent for consent with normal mood and affect Lymphatic: No swelling obvious and reported other than the area involved in the  exam below Extremities   LUE: Evaluation of left upper extremity demonstrates multiple superficial lacerations to the palm, as well as the left thumb.  He is tenderness to palpation at the base of the thumb.  There is a gross deformity of the distal radius.  There is a superficial laceration over the volar aspect of the forearm.  He is able to wiggle his fingers.  He has sensation intact throughout the left hand.  There is a deformity to the left elbow.  There is some swelling.  There is some mild bruising.   Test Results Imaging  Left elbow dislocation, without obvious fracture.  Distal radius fracture, with loss of radial height and mild radial translation.  Comminuted fracture of the left first metacarpal.   No labs were obtained.

## 2023-08-30 ENCOUNTER — Ambulatory Visit: Admitting: Orthopedic Surgery

## 2023-08-30 ENCOUNTER — Encounter: Payer: Self-pay | Admitting: Orthopedic Surgery

## 2023-08-30 VITALS — BP 125/84 | HR 102 | Ht 70.0 in | Wt 199.0 lb

## 2023-08-30 DIAGNOSIS — S52532A Colles' fracture of left radius, initial encounter for closed fracture: Secondary | ICD-10-CM

## 2023-08-30 DIAGNOSIS — S53105D Unspecified dislocation of left ulnohumeral joint, subsequent encounter: Secondary | ICD-10-CM

## 2023-08-30 DIAGNOSIS — S62245A Nondisplaced fracture of shaft of first metacarpal bone, left hand, initial encounter for closed fracture: Secondary | ICD-10-CM

## 2023-08-30 DIAGNOSIS — Z01818 Encounter for other preprocedural examination: Secondary | ICD-10-CM

## 2023-08-30 MED ORDER — ONDANSETRON HCL 4 MG PO TABS: 4.0000 mg | ORAL_TABLET | Freq: Three times a day (TID) | ORAL | 0 refills | Status: DC | PRN

## 2023-08-30 MED ORDER — HYDROCODONE-ACETAMINOPHEN 5-325 MG PO TABS: 1.0000 | ORAL_TABLET | Freq: Four times a day (QID) | ORAL | 0 refills | Status: DC | PRN

## 2023-08-30 NOTE — H&P (View-Only) (Signed)
 New Patient Visit  Assessment: Adam Tyler is a 68 y.o. male with the following: 1. Dislocation of left elbow, subsequent encounter 2. Closed Colles' fracture of left radius, initial encounter 3. Closed nondisplaced fracture of shaft of first metacarpal bone of left hand, initial encounter  Plan: Adam Tyler fell and sustained multiple injuries to his left arm.  He had a dislocated elbow, which required closed reduction in the emergency department.  He also has a distal radius fracture, with residual displacement.  I think he would benefit from ORIF.  I have recommended this.  We discussed the risks and benefits.  Will plan to proceed with surgery on March 24.  In addition, the elbow will be evaluated under anesthesia.  The first metacarpal fracture will also be immobilized.  Risks and benefits of surgery, including, but not limited to infection, bleeding, persistent pain, damage to surrounding structures, need for further surgery, malunion, nonunion and more severe complications associated with anesthesia were discussed.  All questions have been answered and they have elected to proceed with surgery.    Follow-up: Return for After surgery.  Subjective:  Chief Complaint  Patient presents with   Elbow Injury    Left elbow dislocation, DOI 08-27-23.    History of Present Illness: Adam Tyler is a 68 y.o. male who presents for evaluation of arm pain.  He was seen in the emergency department for left arm pain after falling off his front porch.  He had a closed reduction of an elbow dislocation, as well as a wrist fracture and a first metacarpal fracture.  He has done well since the procedure in the emergency department.  He has been taking pain medications which is helping, this allows him to rest.  He has some nausea.  Splint remains intact.  He is here to discuss spinal care.   Review of Systems: No fevers or chills No numbness or tingling No chest pain No shortness of breath No  bowel or bladder dysfunction No GI distress No headaches + nausea   Medical History:  Past Medical History:  Diagnosis Date   Acute medial meniscal injury of knee    left   Anxiety    h/o panic attacks from " stress"   Arthritis    Arthritis, septic, knee (HCC)    Blood glucose elevated    Cancer (HCC) 2007   melanoma forehead    Diabetes mellitus without complication (HCC)    HBA1c 04/20/15 was 7.3    GERD (gastroesophageal reflux disease)    Hypertension    Infection    left knee   Sciatica of left side    per pt  "buldging disc- lumbar"   Sleep apnea    pt denies   Upper respiratory infection 05/17/13   no fever since 05/18/13    Past Surgical History:  Procedure Laterality Date   CHONDROPLASTY Left 03/09/2015   Procedure: CHONDROPLASTY;  Surgeon: Frederico Hamman, MD;  Location: [facility];  Service: Orthopedics;  Laterality: Left;   HAND SURGERY     HERNIA REPAIR     KNEE ARTHROSCOPY Left 03/18/2015   Procedure: ARTHROSCOPIC, LAVAGE, SYNOVECTOMY LEFT KNEE ;  Surgeon: Frederico Hamman, MD;  Location: [facility] OR;  Service: Orthopedics;  Laterality: Left;   KNEE ARTHROSCOPY Left 03/23/2015   Procedure: LEFT KNEE I&D WITH LAVAGE;  Surgeon: Frederico Hamman, MD;  Location: [facility];  Service: Orthopedics;  Laterality: Left;   KNEE ARTHROSCOPY Left 04/22/2015   Procedure:  LEFT KNEE ARTHROSCOPY WITH LAVAGE AND DRAINING WITH SYNOVECTOMY;  Surgeon: Frederico Hamman, MD;  Location: [facility] OR;  Service: Orthopedics;  Laterality: Left;   KNEE ARTHROSCOPY WITH MEDIAL MENISECTOMY Left 03/09/2015   Procedure: KNEE ARTHROSCOPY WITH  PARTIAL MEDIAL MENISECTOMY;  Surgeon: Frederico Hamman, MD;  Location: [facility];  Service: Orthopedics;  Laterality: Left;   MASS EXCISION Right 06/19/2013   Procedure: EXCISION MASS RIGHT POSTERIOR NECK;  Surgeon: Velora Heckler, MD;  Location: [facility];  Service: General;  Laterality: Right;   SKIN GRAFT Right    TONSILLECTOMY       Family History  Problem Relation Age of Onset   Cancer Mother        lymphnode   Stroke Father    Social History   Tobacco Use   Smoking status: Every Day    Current packs/day: 0.20    Average packs/day: 0.2 packs/day for 20.0 years (4.0 ttl pk-yrs)    Types: Cigarettes   Smokeless tobacco: Never  Substance Use Topics   Alcohol use: Yes    Alcohol/week: 7.0 standard drinks of alcohol    Types: 7 Standard drinks or equivalent per week    Comment: pt said all he can / 3-4 beers night, only a beer a night now ( 04/28/15)   Drug use: Yes    Types: Marijuana    Comment: uses for pain in knees    Allergies  Allergen Reactions   Strawberry Extract Hives and Nausea And Vomiting   Adhesive [Tape] Other (See Comments)    Leg raw and red, burning from adhesive bandage 03/18/15   No Known Allergies    Other Other (See Comments)    Leg raw and red, burning from adhesive bandage 03/18/15    Current Meds  Medication Sig   HYDROcodone-acetaminophen (NORCO/VICODIN) 5-325 MG tablet Take 1 tablet by mouth every 6 (six) hours as needed.   ondansetron (ZOFRAN) 4 MG tablet Take 1 tablet (4 mg total) by mouth every 8 (eight) hours as needed for up to 14 days for nausea or vomiting.    Objective: BP 125/84   Pulse (!) 102   Ht 5\' 10"  (1.778 m)   Wt 199 lb (90.3 kg)   BMI 28.55 kg/m   Physical Exam:  General: Alert and oriented. and No acute distress. Gait: Normal gait.  Evaluation left arm demonstrates a splint that is clean, dry and intact.  Fingers are warm and well-perfused.  He has some abrasions which are visible on his thumb, as well as his fingers.  There is some dried blood.  He is wearing a sling.  IMAGING: I personally reviewed images previously obtained from the ED  Elbow dislocation, without obvious fracture  Comminuted fracture of distal radius, with loss of radial height, and radial translation.  Comminuted fracture of the first metacarpal   New Medications:   Meds ordered this encounter  Medications   HYDROcodone-acetaminophen (NORCO/VICODIN) 5-325 MG tablet    Sig: Take 1 tablet by mouth every 6 (six) hours as needed.    Dispense:  20 tablet    Refill:  0   ondansetron (ZOFRAN) 4 MG tablet    Sig: Take 1 tablet (4 mg total) by mouth every 8 (eight) hours as needed for up to 14 days for nausea or vomiting.    Dispense:  15 tablet    Refill:  0      Oliver Barre, MD  08/30/2023 9:33 AM

## 2023-08-30 NOTE — Progress Notes (Signed)
 New Patient Visit  Assessment: Adam Tyler is a 68 y.o. male with the following: 1. Dislocation of left elbow, subsequent encounter 2. Closed Colles' fracture of left radius, initial encounter 3. Closed nondisplaced fracture of shaft of first metacarpal bone of left hand, initial encounter  Plan: Adam Tyler fell and sustained multiple injuries to his left arm.  He had a dislocated elbow, which required closed reduction in the emergency department.  He also has a distal radius fracture, with residual displacement.  I think he would benefit from ORIF.  I have recommended this.  We discussed the risks and benefits.  Will plan to proceed with surgery on March 24.  In addition, the elbow will be evaluated under anesthesia.  The first metacarpal fracture will also be immobilized.  Risks and benefits of surgery, including, but not limited to infection, bleeding, persistent pain, damage to surrounding structures, need for further surgery, malunion, nonunion and more severe complications associated with anesthesia were discussed.  All questions have been answered and they have elected to proceed with surgery.    Follow-up: Return for After surgery.  Subjective:  Chief Complaint  Patient presents with   Elbow Injury    Left elbow dislocation, DOI 08-27-23.    History of Present Illness: Adam Tyler is a 68 y.o. male who presents for evaluation of arm pain.  He was seen in the emergency department for left arm pain after falling off his front porch.  He had a closed reduction of an elbow dislocation, as well as a wrist fracture and a first metacarpal fracture.  He has done well since the procedure in the emergency department.  He has been taking pain medications which is helping, this allows him to rest.  He has some nausea.  Splint remains intact.  He is here to discuss spinal care.   Review of Systems: No fevers or chills No numbness or tingling No chest pain No shortness of breath No  bowel or bladder dysfunction No GI distress No headaches + nausea   Medical History:  Past Medical History:  Diagnosis Date   Acute medial meniscal injury of knee    left   Anxiety    h/o panic attacks from " stress"   Arthritis    Arthritis, septic, knee (HCC)    Blood glucose elevated    Cancer (HCC) 2007   melanoma forehead    Diabetes mellitus without complication (HCC)    HBA1c 04/20/15 was 7.3    GERD (gastroesophageal reflux disease)    Hypertension    Infection    left knee   Sciatica of left side    per pt  "buldging disc- lumbar"   Sleep apnea    pt denies   Upper respiratory infection 05/17/13   no fever since 05/18/13    Past Surgical History:  Procedure Laterality Date   CHONDROPLASTY Left 03/09/2015   Procedure: CHONDROPLASTY;  Surgeon: Frederico Hamman, MD;  Location: [facility];  Service: Orthopedics;  Laterality: Left;   HAND SURGERY     HERNIA REPAIR     KNEE ARTHROSCOPY Left 03/18/2015   Procedure: ARTHROSCOPIC, LAVAGE, SYNOVECTOMY LEFT KNEE ;  Surgeon: Frederico Hamman, MD;  Location: [facility] OR;  Service: Orthopedics;  Laterality: Left;   KNEE ARTHROSCOPY Left 03/23/2015   Procedure: LEFT KNEE I&D WITH LAVAGE;  Surgeon: Frederico Hamman, MD;  Location: [facility];  Service: Orthopedics;  Laterality: Left;   KNEE ARTHROSCOPY Left 04/22/2015   Procedure:  LEFT KNEE ARTHROSCOPY WITH LAVAGE AND DRAINING WITH SYNOVECTOMY;  Surgeon: Frederico Hamman, MD;  Location: [facility] OR;  Service: Orthopedics;  Laterality: Left;   KNEE ARTHROSCOPY WITH MEDIAL MENISECTOMY Left 03/09/2015   Procedure: KNEE ARTHROSCOPY WITH  PARTIAL MEDIAL MENISECTOMY;  Surgeon: Frederico Hamman, MD;  Location: [facility];  Service: Orthopedics;  Laterality: Left;   MASS EXCISION Right 06/19/2013   Procedure: EXCISION MASS RIGHT POSTERIOR NECK;  Surgeon: Velora Heckler, MD;  Location: [facility];  Service: General;  Laterality: Right;   SKIN GRAFT Right    TONSILLECTOMY       Family History  Problem Relation Age of Onset   Cancer Mother        lymphnode   Stroke Father    Social History   Tobacco Use   Smoking status: Every Day    Current packs/day: 0.20    Average packs/day: 0.2 packs/day for 20.0 years (4.0 ttl pk-yrs)    Types: Cigarettes   Smokeless tobacco: Never  Substance Use Topics   Alcohol use: Yes    Alcohol/week: 7.0 standard drinks of alcohol    Types: 7 Standard drinks or equivalent per week    Comment: pt said all he can / 3-4 beers night, only a beer a night now ( 04/28/15)   Drug use: Yes    Types: Marijuana    Comment: uses for pain in knees    Allergies  Allergen Reactions   Strawberry Extract Hives and Nausea And Vomiting   Adhesive [Tape] Other (See Comments)    Leg raw and red, burning from adhesive bandage 03/18/15   No Known Allergies    Other Other (See Comments)    Leg raw and red, burning from adhesive bandage 03/18/15    Current Meds  Medication Sig   HYDROcodone-acetaminophen (NORCO/VICODIN) 5-325 MG tablet Take 1 tablet by mouth every 6 (six) hours as needed.   ondansetron (ZOFRAN) 4 MG tablet Take 1 tablet (4 mg total) by mouth every 8 (eight) hours as needed for up to 14 days for nausea or vomiting.    Objective: BP 125/84   Pulse (!) 102   Ht 5\' 10"  (1.778 m)   Wt 199 lb (90.3 kg)   BMI 28.55 kg/m   Physical Exam:  General: Alert and oriented. and No acute distress. Gait: Normal gait.  Evaluation left arm demonstrates a splint that is clean, dry and intact.  Fingers are warm and well-perfused.  He has some abrasions which are visible on his thumb, as well as his fingers.  There is some dried blood.  He is wearing a sling.  IMAGING: I personally reviewed images previously obtained from the ED  Elbow dislocation, without obvious fracture  Comminuted fracture of distal radius, with loss of radial height, and radial translation.  Comminuted fracture of the first metacarpal   New Medications:   Meds ordered this encounter  Medications   HYDROcodone-acetaminophen (NORCO/VICODIN) 5-325 MG tablet    Sig: Take 1 tablet by mouth every 6 (six) hours as needed.    Dispense:  20 tablet    Refill:  0   ondansetron (ZOFRAN) 4 MG tablet    Sig: Take 1 tablet (4 mg total) by mouth every 8 (eight) hours as needed for up to 14 days for nausea or vomiting.    Dispense:  15 tablet    Refill:  0      Oliver Barre, MD  08/30/2023 9:33 AM

## 2023-08-30 NOTE — Patient Instructions (Signed)
 Your surgery will be at [facility], scheduled with Dr Thane Edu   The hospital will contact you with a preoperative appointment to discuss Anesthesia. The phone number is [phone]   Please bring your medications with you for the appointment.  They will tell you the arrival time and medication instructions when you have your preoperative evaluation.  Do not wear nail polish the day of your surgery and if you take Phentermine you need to stop this medication ONE WEEK prior to your surgery.    If you take an blood thinning medication, we will need to stop this prior to surgery.  Typically, we stop this medicine at least 5 days prior to surgery.  We will need to confirm this with the doctor who prescribes this medication.  If you are taking medications or an injection for diabetes, or for weight management, this medicine will need to be stopped at least 7 days prior to surgery.     Surgery will be scheduled for 09/09/2023 pending authorization by your insurance company.

## 2023-09-04 NOTE — Patient Instructions (Signed)
 Adam Tyler  09/04/2023     @PREFPERIOPPHARMACY @   Your procedure is scheduled on  09/09/2023.    Report to [facility] at  0900 A.M.   Call this number if you have problems the morning of surgery:  [phone]  If you experience any cold or flu symptoms such as cough, fever, chills, shortness of breath, etc. between now and your scheduled surgery, please notify us at the above number.   Remember:  Do not eat after midnight.   You may drink clear liquids until 0700 am on 09/09/2023.    Clear liquids allowed are:                    Water, Juice (No red color; non-citric and without pulp; diabetics please choose diet or no sugar options), Carbonated beverages (diabetics please choose diet or no sugar options), Clear Tea (No creamer, milk, or cream, including half & half and powdered creamer), Black Coffee Only (No creamer, milk or cream, including half & half and powdered creamer), and Clear Sports drink (No red color; diabetics please choose diet or no sugar options)    Take these medicines the morning of surgery with A SIP OF WATER         carvedilol, hydrocodone (if needed), pantoprazole, tamsulosin, verenicline.     Do not wear jewelry, make-up or nail polish, including gel polish,  artificial nails, or any other type of covering on natural nails (fingers and  toes).  Do not wear lotions, powders, or perfumes, or deodorant.  Do not shave 48 hours prior to surgery.  Men may shave face and neck.  Do not bring valuables to the hospital.  [facility] is not responsible for any belongings or valuables.  Contacts, dentures or bridgework may not be worn into surgery.  Leave your suitcase in the car.  After surgery it may be brought to your room.  For patients admitted to the hospital, discharge time will be determined by your treatment team.  Patients discharged the day of surgery will not be allowed to drive home and must have someone with them for 24 hours.     Special instructions:   DO NOT smoke tobacco or vape for 24 hours before your procedure.  Please read over the following fact sheets that you were given. Coughing and Deep Breathing, Surgical Site Infection Prevention, Anesthesia Post-op Instructions, and Care and Recovery After Surgery        ORIF Surgery for a Broken Wrist: What to Know After After an ORIF surgery for a broken wrist, you may have pain, swelling, bruising, and stiffness. You may also see a small amount of fluid or blood in your bandage. ORIF is s a type of surgery that is used to repair a break in a bone and keep the bones stable. Follow these instructions at home:  Medicines Take your medicines only as told. You may need to take steps to help treat or prevent trouble pooping (constipation), such as: Taking medicines to help you poop. Eating foods high in fiber, like beans, whole grains, and fresh fruits and vegetables. Drinking more fluids as told. Ask your provider if it's safe to drive or use machines while taking your medicine. If you have a splint and compression bandage: Do not remove the splint, fluffy gauze, and bandage. Your provider will usually remove this on your first follow-up visit. If the bandage feels too tight and causes a lot of pain,  you can loosen it as told. Rewrap it again so that it feels snug but not painful. Keep the splint and bandage clean and dry. If you have a cast: Do not put pressure on any part of the cast until it's hard. This may take a few hours. Do not stick anything inside it to scratch your skin. Doing this can lead to infection. Check the skin around the cast every day. Tell your provider if you see problems. It's OK to put lotion on dry skin around the cast. Keep the cast clean and dry. If you have a sling: Wear the sling as told. Take it off only if your provider says you can. Check the skin around it every day. Tell your provider if you see problems. Loosen the sling  if your fingers tingle, are numb, or turn cold and blue. Keep the sling clean and dry. Bathing Do not take baths, swim, or use a hot tub until you're told it is OK. Ask if you can shower. If you have a splint and compression bandage, or a cast that is not waterproof: Do not let it get wet. Cover it when you take a bath or shower. Use a cover that doesn't let any water in. Incision care  After your first visit, take care of your cut from surgery as told. Make sure you: Wash your hands with soap and water for at least 20 seconds before and after you change your bandage. If you can't use soap and water, use hand sanitizer. Change your bandage. Leave stitches or skin glue alone. Leave tape strips alone unless you're told to take them off. You may trim the edges of the tape strips if they curl up. Check the area around your cut from surgery every day for signs of infection. Check for: More redness, swelling, or pain. More fluid or blood. Warmth. Pus or a bad smell. Managing pain, stiffness, and swelling  Use ice or an ice pack as told. If you have a sling that you can take off, remove it only as told. Place a towel between your skin and the ice or between your cast and the ice. Leave the ice on for 20 minutes, 2-3 times a day. If your skin turns bright red, take off the ice right away to prevent skin damage. The risk of damage is higher if you can't feel pain, heat, or cold. Move your fingers often to reduce stiffness and swelling. Raise your wrist above the level of your heart while you're sitting or lying down. Use pillows as needed. Activity Rest as told. Ask what things are safe for you to do at home. Ask when you can go back to work or school. Ask your provider when it's safe to drive. Ask if it's OK for you to lift. Avoid pulling and pushing. Exercise as told. You will work with a physical therapist for a few months. General instructions Do not smoke, vape or use nicotine or  tobacco. Keep all follow-up visits. Your provider needs to make sure your wrist is healing correctly. Contact a health care provider if: You have any signs of infection. You have a fever or chills. Your pain is very bad and medicine is not helping. Your cast is damaged. Your cast feels too tight or too loose. Your arm feels cold or numb. Your skin or fingers on your injured arm turn blue or gray. If you can't reach your provider, go to an urgent care or emergency room. Get help right  away if: You have trouble breathing. You feel faint or light-headed. These symptoms may be an emergency. Call 911 right away. Do not wait to see if the symptoms will go away. Do not drive yourself to the hospital. This information is not intended to replace advice given to you by your health care provider. Make sure you discuss any questions you have with your health care provider. Document Revised: 01/28/2023 Document Reviewed: 01/28/2023 Elsevier Patient Education  2024 [employer].General Anesthesia, Adult, Care After The following information offers guidance on how to care for yourself after your procedure. Your health care provider may also give you more specific instructions. If you have problems or questions, contact your health care provider. What can I expect after the procedure? After the procedure, it is common for people to: Have pain or discomfort at the IV site. Have nausea or vomiting. Have a sore throat or hoarseness. Have trouble concentrating. Feel cold or chills. Feel weak, sleepy, or tired (fatigue). Have soreness and body aches. These can affect parts of the body that were not involved in surgery. Follow these instructions at home: For the time period you were told by your health care provider:  Rest. Do not participate in activities where you could fall or become injured. Do not drive or use machinery. Do not drink alcohol. Do not take sleeping pills or medicines that cause  drowsiness. Do not make important decisions or sign legal documents. Do not take care of children on your own. General instructions Drink enough fluid to keep your urine pale yellow. If you have sleep apnea, surgery and certain medicines can increase your risk for breathing problems. Follow instructions from your health care provider about wearing your sleep device: Anytime you are sleeping, including during daytime naps. While taking prescription pain medicines, sleeping medicines, or medicines that make you drowsy. Return to your normal activities as told by your health care provider. Ask your health care provider what activities are safe for you. Take over-the-counter and prescription medicines only as told by your health care provider. Do not use any products that contain nicotine or tobacco. These products include cigarettes, chewing tobacco, and vaping devices, such as e-cigarettes. These can delay incision healing after surgery. If you need help quitting, ask your health care provider. Contact a health care provider if: You have nausea or vomiting that does not get better with medicine. You vomit every time you eat or drink. You have pain that does not get better with medicine. You cannot urinate or have bloody urine. You develop a skin rash. You have a fever. Get help right away if: You have trouble breathing. You have chest pain. You vomit blood. These symptoms may be an emergency. Get help right away. Call 911. Do not wait to see if the symptoms will go away. Do not drive yourself to the hospital. Summary After the procedure, it is common to have a sore throat, hoarseness, nausea, vomiting, or to feel weak, sleepy, or fatigue. For the time period you were told by your health care provider, do not drive or use machinery. Get help right away if you have difficulty breathing, have chest pain, or vomit blood. These symptoms may be an emergency. This information is not intended to  replace advice given to you by your health care provider. Make sure you discuss any questions you have with your health care provider. Document Revised: 09/01/2021 Document Reviewed: 09/01/2021 Elsevier Patient Education  2024 Elsevier Inc.How to Use Chlorhexidine at Home in the  Shower Chlorhexidine gluconate (CHG) is a germ-killing (antiseptic) wash that's used to clean the skin. It can get rid of the germs that normally live on the skin and can keep them away for about 24 hours. If you're having surgery, you may be told to shower with CHG at home the night before surgery. This can help lower your risk for infection. To use CHG wash in the shower, follow the steps below. Supplies needed: CHG body wash. Clean washcloth. Clean towel. How to use CHG in the shower Follow these steps unless you're told to use CHG in a different way: Start the shower. Use your normal soap and shampoo to wash your face and hair. Turn off the shower or move out of the shower stream. Pour CHG onto a clean washcloth. Do not use any type of brush or rough sponge. Start at your neck, washing your body down to your toes. Make sure you: Wash the part of your body where the surgery will be done for at least 1 minute. Do not scrub. Do not use CHG on your head or face unless your health care provider tells you to. If it gets into your ears or eyes, rinse them well with water. Do not wash your genitals with CHG. Wash your back and under your arms. Make sure to wash skin folds. Let the CHG sit on your skin for 1-2 minutes or as long as told. Rinse your entire body in the shower, including all body creases and folds. Turn off the shower. Dry off with a clean towel. Do not put anything on your skin afterward, such as powder, lotion, or perfume. Put on clean clothes or pajamas. If it's the night before surgery, sleep in clean sheets. General tips Use CHG only as told, and follow the instructions on the label. Use the full  amount of CHG as told. This is often one bottle. Do not smoke and stay away from flames after using CHG. Your skin may feel sticky after using CHG. This is normal. The sticky feeling will go away as the CHG dries. Do not use CHG: If you have a chlorhexidine allergy or have reacted to chlorhexidine in the past. On open wounds or areas of skin that have broken skin, cuts, or scrapes. On babies younger than 49 months of age. Contact a health care provider if: You have questions about using CHG. Your skin gets irritated or itchy. You have a rash after using CHG. You swallow any CHG. Call your local poison control center [phone] in the [place] Your eyes itch badly, or they become very red or swollen. Your hearing changes. You have trouble seeing. If you can't reach your provider, go to an urgent care or emergency room. Do not drive yourself. Get help right away if: You have swelling or tingling in your mouth or throat. You make high-pitched whistling sounds when you breathe, most often when you breathe out (wheeze). You have trouble breathing. These symptoms may be an emergency. Call 911 right away. Do not wait to see if the symptoms will go away. Do not drive yourself to the hospital. This information is not intended to replace advice given to you by your health care provider. Make sure you discuss any questions you have with your health care provider. Document Revised: 12/18/2022 Document Reviewed: 12/14/2021 Elsevier Patient Education  2024 [employer].

## 2023-09-05 ENCOUNTER — Encounter (HOSPITAL_COMMUNITY): Payer: Self-pay

## 2023-09-05 ENCOUNTER — Encounter (HOSPITAL_COMMUNITY)
Admission: RE | Admit: 2023-09-05 | Discharge: 2023-09-05 | Disposition: A | Discharge status: Home / Self Care | Source: Ambulatory Visit | Attending: Orthopedic Surgery | Admitting: Orthopedic Surgery

## 2023-09-05 ENCOUNTER — Other Ambulatory Visit: Payer: Self-pay

## 2023-09-05 VITALS — BP 125/84 | HR 102 | Temp 98.6°F | Resp 22

## 2023-09-05 DIAGNOSIS — F17200 Nicotine dependence, unspecified, uncomplicated: Secondary | ICD-10-CM | POA: Diagnosis not present

## 2023-09-05 DIAGNOSIS — S52532A Colles' fracture of left radius, initial encounter for closed fracture: Secondary | ICD-10-CM

## 2023-09-05 DIAGNOSIS — E1165 Type 2 diabetes mellitus with hyperglycemia: Secondary | ICD-10-CM | POA: Diagnosis not present

## 2023-09-05 DIAGNOSIS — S62245A Nondisplaced fracture of shaft of first metacarpal bone, left hand, initial encounter for closed fracture: Secondary | ICD-10-CM

## 2023-09-05 DIAGNOSIS — Z01810 Encounter for preprocedural cardiovascular examination: Secondary | ICD-10-CM | POA: Diagnosis present

## 2023-09-05 DIAGNOSIS — Z01818 Encounter for other preprocedural examination: Secondary | ICD-10-CM | POA: Insufficient documentation

## 2023-09-05 DIAGNOSIS — Z01812 Encounter for preprocedural laboratory examination: Secondary | ICD-10-CM | POA: Diagnosis present

## 2023-09-05 DIAGNOSIS — S53105D Unspecified dislocation of left ulnohumeral joint, subsequent encounter: Secondary | ICD-10-CM

## 2023-09-05 LAB — CBC
HCT: 35.8 % — ABNORMAL LOW (ref 39.0–52.0)
Hemoglobin: 12.3 g/dL — ABNORMAL LOW (ref 13.0–17.0)
MCH: 32.3 pg (ref 26.0–34.0)
MCHC: 34.4 g/dL (ref 30.0–36.0)
MCV: 94 fL (ref 80.0–100.0)
Platelets: 421 10*3/uL — ABNORMAL HIGH (ref 150–400)
RBC: 3.81 MIL/uL — ABNORMAL LOW (ref 4.22–5.81)
RDW: 12.8 % (ref 11.5–15.5)
WBC: 8.7 10*3/uL (ref 4.0–10.5)
nRBC: 0 % (ref 0.0–0.2)

## 2023-09-05 LAB — BASIC METABOLIC PANEL
Anion gap: 15 (ref 5–15)
BUN: 16 mg/dL (ref 8–23)
CO2: 21 mmol/L — ABNORMAL LOW (ref 22–32)
Calcium: 8.9 mg/dL (ref 8.9–10.3)
Chloride: 92 mmol/L — ABNORMAL LOW (ref 98–111)
Creatinine, Ser: 0.76 mg/dL (ref 0.61–1.24)
GFR, Estimated: >60 mL/min (ref >60)
Glucose, Bld: 401 mg/dL — ABNORMAL HIGH (ref 70–99)
Potassium: 3.9 mmol/L (ref 3.5–5.1)
Sodium: 128 mmol/L — ABNORMAL LOW (ref 135–145)

## 2023-09-05 LAB — HEMOGLOBIN A1C
Hgb A1c MFr Bld: 10.6 % — ABNORMAL HIGH (ref 4.8–5.6)
Mean Plasma Glucose: 257.52 mg/dL

## 2023-09-06 NOTE — Progress Notes (Signed)
 Non-fasting GLU 401, HGB A1C 10.6 reported to Dr Dallas Schimke.  Will obtain fasting CBG am of surgery.  No orders given.

## 2023-09-09 ENCOUNTER — Ambulatory Visit (HOSPITAL_COMMUNITY)

## 2023-09-09 ENCOUNTER — Ambulatory Visit (HOSPITAL_COMMUNITY): Admitting: Anesthesiology

## 2023-09-09 ENCOUNTER — Ambulatory Visit (HOSPITAL_COMMUNITY)
Admission: RE | Admit: 2023-09-09 | Discharge: 2023-09-09 | Disposition: A | Discharge status: Home / Self Care | Attending: Orthopedic Surgery | Admitting: Orthopedic Surgery

## 2023-09-09 ENCOUNTER — Encounter (HOSPITAL_COMMUNITY): Payer: Self-pay | Admitting: Orthopedic Surgery

## 2023-09-09 ENCOUNTER — Other Ambulatory Visit: Payer: Self-pay

## 2023-09-09 ENCOUNTER — Ambulatory Visit (HOSPITAL_BASED_OUTPATIENT_CLINIC_OR_DEPARTMENT_OTHER): Admitting: Anesthesiology

## 2023-09-09 ENCOUNTER — Encounter (HOSPITAL_COMMUNITY)
Admission: RE | Disposition: A | Discharge status: Home / Self Care | Payer: Self-pay | Source: Home / Self Care | Attending: Orthopedic Surgery

## 2023-09-09 DIAGNOSIS — F17210 Nicotine dependence, cigarettes, uncomplicated: Secondary | ICD-10-CM | POA: Diagnosis not present

## 2023-09-09 DIAGNOSIS — S52532A Colles' fracture of left radius, initial encounter for closed fracture: Secondary | ICD-10-CM | POA: Diagnosis present

## 2023-09-09 DIAGNOSIS — S53105A Unspecified dislocation of left ulnohumeral joint, initial encounter: Secondary | ICD-10-CM | POA: Insufficient documentation

## 2023-09-09 DIAGNOSIS — S52502A Unspecified fracture of the lower end of left radius, initial encounter for closed fracture: Secondary | ICD-10-CM | POA: Diagnosis not present

## 2023-09-09 DIAGNOSIS — S62245A Nondisplaced fracture of shaft of first metacarpal bone, left hand, initial encounter for closed fracture: Secondary | ICD-10-CM | POA: Diagnosis not present

## 2023-09-09 DIAGNOSIS — I10 Essential (primary) hypertension: Secondary | ICD-10-CM | POA: Insufficient documentation

## 2023-09-09 DIAGNOSIS — E119 Type 2 diabetes mellitus without complications: Secondary | ICD-10-CM | POA: Insufficient documentation

## 2023-09-09 DIAGNOSIS — Z7984 Long term (current) use of oral hypoglycemic drugs: Secondary | ICD-10-CM | POA: Diagnosis not present

## 2023-09-09 DIAGNOSIS — S53105D Unspecified dislocation of left ulnohumeral joint, subsequent encounter: Secondary | ICD-10-CM | POA: Diagnosis not present

## 2023-09-09 DIAGNOSIS — W19XXXA Unspecified fall, initial encounter: Secondary | ICD-10-CM | POA: Insufficient documentation

## 2023-09-09 HISTORY — PX: OPEN REDUCTION INTERNAL FIXATION (ORIF) DISTAL RADIAL FRACTURE: SHX5989

## 2023-09-09 LAB — GLUCOSE, CAPILLARY
Glucose-Capillary: 283 mg/dL — ABNORMAL HIGH (ref 70–99)
Glucose-Capillary: 306 mg/dL — ABNORMAL HIGH (ref 70–99)

## 2023-09-09 SURGERY — OPEN REDUCTION INTERNAL FIXATION (ORIF) DISTAL RADIUS FRACTURE
Anesthesia: General | Site: Arm Upper | Laterality: Left

## 2023-09-09 MED ORDER — MIDAZOLAM HCL 2 MG/2ML IJ SOLN
INTRAMUSCULAR | Status: AC
Start: 2023-09-09 — End: ?
  Filled 2023-09-09: qty 2

## 2023-09-09 MED ORDER — OXYCODONE HCL 5 MG/5ML PO SOLN: 5.0000 mg | Freq: Once | ORAL | Status: AC | PRN

## 2023-09-09 MED ORDER — SODIUM CHLORIDE 0.9 % IR SOLN
Status: DC | PRN
  Administered 2023-09-09: 1000 mL

## 2023-09-09 MED ORDER — HYDROMORPHONE HCL 1 MG/ML IJ SOLN
INTRAMUSCULAR | Status: AC
  Filled 2023-09-09: qty 0.5

## 2023-09-09 MED ORDER — HYDROMORPHONE HCL 1 MG/ML IJ SOLN
INTRAMUSCULAR | Status: DC | PRN
  Administered 2023-09-09: .5 mg via INTRAVENOUS

## 2023-09-09 MED ORDER — BUPIVACAINE-EPINEPHRINE (PF) 0.5% -1:200000 IJ SOLN
INTRAMUSCULAR | Status: AC
  Filled 2023-09-09: qty 30

## 2023-09-09 MED ORDER — ROCURONIUM BROMIDE 10 MG/ML (PF) SYRINGE
PREFILLED_SYRINGE | INTRAVENOUS | Status: DC | PRN
  Administered 2023-09-09 (×3): 10 mg via INTRAVENOUS
  Administered 2023-09-09: 40 mg via INTRAVENOUS

## 2023-09-09 MED ORDER — ORAL CARE MOUTH RINSE: 15.0000 mL | Freq: Once | OROMUCOSAL | Status: AC

## 2023-09-09 MED ORDER — LIDOCAINE HCL (CARDIAC) PF 100 MG/5ML IV SOSY
PREFILLED_SYRINGE | INTRAVENOUS | Status: DC | PRN
  Administered 2023-09-09: 80 mg via INTRATRACHEAL

## 2023-09-09 MED ORDER — ROCURONIUM BROMIDE 10 MG/ML (PF) SYRINGE
PREFILLED_SYRINGE | INTRAVENOUS | Status: AC
Start: 2023-09-09 — End: ?
  Filled 2023-09-09: qty 10

## 2023-09-09 MED ORDER — CHLORHEXIDINE GLUCONATE 0.12 % MT SOLN
15.0000 mL | Freq: Once | OROMUCOSAL | Status: AC
  Administered 2023-09-09: 15 mL via OROMUCOSAL

## 2023-09-09 MED ORDER — ONDANSETRON HCL 4 MG/2ML IJ SOLN
INTRAMUSCULAR | Status: AC
Start: 2023-09-09 — End: ?
  Filled 2023-09-09: qty 2

## 2023-09-09 MED ORDER — PROPOFOL 10 MG/ML IV BOLUS
INTRAVENOUS | Status: DC | PRN
  Administered 2023-09-09: 120 mg via INTRAVENOUS

## 2023-09-09 MED ORDER — OXYCODONE HCL 5 MG PO TABS
5.0000 mg | ORAL_TABLET | ORAL | 0 refills | Status: AC | PRN
Start: 2023-09-09 — End: 2023-09-16

## 2023-09-09 MED ORDER — PROPOFOL 10 MG/ML IV BOLUS
INTRAVENOUS | Status: AC
  Filled 2023-09-09: qty 20

## 2023-09-09 MED ORDER — LACTATED RINGERS IV SOLN: INTRAVENOUS | Status: DC

## 2023-09-09 MED ORDER — CEFAZOLIN SODIUM-DEXTROSE 2-4 GM/100ML-% IV SOLN
2.0000 g | INTRAVENOUS | Status: AC
  Administered 2023-09-09: 2 g via INTRAVENOUS

## 2023-09-09 MED ORDER — PHENYLEPHRINE 80 MCG/ML (10ML) SYRINGE FOR IV PUSH (FOR BLOOD PRESSURE SUPPORT)
PREFILLED_SYRINGE | INTRAVENOUS | Status: AC
  Filled 2023-09-09: qty 10

## 2023-09-09 MED ORDER — CELECOXIB 100 MG PO CAPS: 100.0000 mg | ORAL_CAPSULE | Freq: Every day | ORAL | 0 refills | Status: AC

## 2023-09-09 MED ORDER — MIDAZOLAM HCL 5 MG/5ML IJ SOLN
INTRAMUSCULAR | Status: DC | PRN
  Administered 2023-09-09: 2 mg via INTRAVENOUS

## 2023-09-09 MED ORDER — FENTANYL CITRATE (PF) 100 MCG/2ML IJ SOLN
INTRAMUSCULAR | Status: AC
  Filled 2023-09-09: qty 2

## 2023-09-09 MED ORDER — ONDANSETRON HCL 4 MG/2ML IJ SOLN: 4.0000 mg | Freq: Once | INTRAMUSCULAR | Status: DC | PRN

## 2023-09-09 MED ORDER — KETAMINE HCL 50 MG/5ML IJ SOSY
PREFILLED_SYRINGE | INTRAMUSCULAR | Status: AC
  Filled 2023-09-09: qty 5

## 2023-09-09 MED ORDER — FENTANYL CITRATE (PF) 100 MCG/2ML IJ SOLN
INTRAMUSCULAR | Status: DC | PRN
  Administered 2023-09-09: 50 ug via INTRAVENOUS
  Administered 2023-09-09: 100 ug via INTRAVENOUS
  Administered 2023-09-09 (×3): 50 ug via INTRAVENOUS

## 2023-09-09 MED ORDER — CEFAZOLIN SODIUM-DEXTROSE 2-4 GM/100ML-% IV SOLN
INTRAVENOUS | Status: AC
  Filled 2023-09-09: qty 100

## 2023-09-09 MED ORDER — EPHEDRINE SULFATE-NACL 50-0.9 MG/10ML-% IV SOSY
PREFILLED_SYRINGE | INTRAVENOUS | Status: DC | PRN
  Administered 2023-09-09: 10 mg via INTRAVENOUS

## 2023-09-09 MED ORDER — ONDANSETRON HCL 4 MG PO TABS: 4.0000 mg | ORAL_TABLET | Freq: Three times a day (TID) | ORAL | 0 refills | Status: DC | PRN

## 2023-09-09 MED ORDER — STERILE WATER FOR IRRIGATION IR SOLN
Status: DC | PRN
  Administered 2023-09-09: 1000 mL

## 2023-09-09 MED ORDER — FENTANYL CITRATE PF 50 MCG/ML IJ SOSY: 25.0000 ug | PREFILLED_SYRINGE | INTRAMUSCULAR | Status: DC | PRN

## 2023-09-09 MED ORDER — LIDOCAINE HCL (PF) 2 % IJ SOLN
INTRAMUSCULAR | Status: AC
  Filled 2023-09-09: qty 5

## 2023-09-09 MED ORDER — GLYCOPYRROLATE PF 0.2 MG/ML IJ SOSY
PREFILLED_SYRINGE | INTRAMUSCULAR | Status: DC | PRN
  Administered 2023-09-09: .2 mg via INTRAVENOUS

## 2023-09-09 MED ORDER — OXYCODONE HCL 5 MG PO TABS
5.0000 mg | ORAL_TABLET | Freq: Once | ORAL | Status: AC | PRN
  Administered 2023-09-09: 5 mg via ORAL
  Filled 2023-09-09: qty 1

## 2023-09-09 MED ORDER — PHENYLEPHRINE 80 MCG/ML (10ML) SYRINGE FOR IV PUSH (FOR BLOOD PRESSURE SUPPORT)
PREFILLED_SYRINGE | INTRAVENOUS | Status: DC | PRN
  Administered 2023-09-09: 80 ug via INTRAVENOUS

## 2023-09-09 MED ORDER — EPHEDRINE 5 MG/ML INJ
INTRAVENOUS | Status: AC
  Filled 2023-09-09: qty 5

## 2023-09-09 MED ORDER — ONDANSETRON HCL 4 MG/2ML IJ SOLN
INTRAMUSCULAR | Status: DC | PRN
  Administered 2023-09-09: 4 mg via INTRAVENOUS

## 2023-09-09 MED ORDER — KETAMINE HCL 50 MG/5ML IJ SOSY
PREFILLED_SYRINGE | INTRAMUSCULAR | Status: DC | PRN
  Administered 2023-09-09: 50 mg via INTRAVENOUS

## 2023-09-09 MED ORDER — BUPIVACAINE-EPINEPHRINE (PF) 0.5% -1:200000 IJ SOLN
INTRAMUSCULAR | Status: DC | PRN
  Administered 2023-09-09: 30 mL via PERINEURAL

## 2023-09-09 MED ORDER — SUGAMMADEX SODIUM 200 MG/2ML IV SOLN
INTRAVENOUS | Status: AC
Start: 2023-09-09 — End: ?
  Filled 2023-09-09: qty 2

## 2023-09-09 MED ORDER — SUGAMMADEX SODIUM 200 MG/2ML IV SOLN
INTRAVENOUS | Status: DC | PRN
  Administered 2023-09-09: 200 mg via INTRAVENOUS

## 2023-09-09 MED ORDER — ACETAMINOPHEN 500 MG PO TABS
1000.0000 mg | ORAL_TABLET | Freq: Three times a day (TID) | ORAL | 0 refills | Status: AC
Start: 2023-09-09 — End: 2023-09-23

## 2023-09-09 SURGICAL SUPPLY — 52 items
BANDAGE ESMARK 4X12 BL STRL LF (DISPOSABLE) ×1 IMPLANT
BIT DRILL 1.7 (BIT) IMPLANT
BIT DRILL 2.5 CANN REUSE (DRILL) IMPLANT
BLADE SURG 15 STRL LF DISP TIS (BLADE) ×1 IMPLANT
BNDG COHESIVE 4X5 TAN STRL (GAUZE/BANDAGES/DRESSINGS) ×1 IMPLANT
BNDG ELASTIC 3X5.8 VLCR NS LF (GAUZE/BANDAGES/DRESSINGS) ×2 IMPLANT
BNDG ESMARK 4X12 BLUE STRL LF (DISPOSABLE) ×1 IMPLANT
CHLORAPREP W/TINT 26 (MISCELLANEOUS) ×1 IMPLANT
CLOTH BEACON ORANGE TIMEOUT ST (SAFETY) ×1 IMPLANT
COVER LIGHT HANDLE STERIS (MISCELLANEOUS) ×2 IMPLANT
CUFF TOURN SGL QUICK 18X4 (TOURNIQUET CUFF) ×1 IMPLANT
DRAPE C-ARM FOLDED MOBILE STRL (DRAPES) ×1 IMPLANT
DRAPE HALF SHEET 40X57 (DRAPES) IMPLANT
ELECT REM PT RETURN 9FT ADLT (ELECTROSURGICAL) ×1 IMPLANT
ELECTRODE REM PT RTRN 9FT ADLT (ELECTROSURGICAL) ×1 IMPLANT
GAUZE SPONGE 4X4 12PLY STRL (GAUZE/BANDAGES/DRESSINGS) IMPLANT
GAUZE XEROFORM 1X8 LF (GAUZE/BANDAGES/DRESSINGS) ×1 IMPLANT
GLOVE BIO SURGEON STRL SZ7 (GLOVE) IMPLANT
GLOVE BIO SURGEON STRL SZ8 (GLOVE) ×3 IMPLANT
GLOVE BIOGEL PI IND STRL 7.0 (GLOVE) ×2 IMPLANT
GLOVE BIOGEL PI IND STRL 7.5 (GLOVE) IMPLANT
GLOVE BIOGEL PI IND STRL 8 (GLOVE) ×1 IMPLANT
GLOVE SURG SS PI 7.5 STRL IVOR (GLOVE) IMPLANT
GOWN STRL REUS W/TWL LRG LVL3 (GOWN DISPOSABLE) ×1 IMPLANT
GOWN STRL REUS W/TWL XL LVL3 (GOWN DISPOSABLE) ×3 IMPLANT
GUIDEWIRE 1.35MM (WIRE) IMPLANT
KIT TURNOVER KIT A (KITS) ×1 IMPLANT
MANIFOLD NEPTUNE II (INSTRUMENTS) ×1 IMPLANT
NDL HYPO 21X1.5 SAFETY (NEEDLE) IMPLANT
NEEDLE HYPO 21X1.5 SAFETY (NEEDLE) ×1 IMPLANT
NS IRRIG 1000ML POUR BTL (IV SOLUTION) ×1 IMPLANT
PACK BASIC LIMB (CUSTOM PROCEDURE TRAY) ×1 IMPLANT
PAD ARMBOARD POSITIONER FOAM (MISCELLANEOUS) ×1 IMPLANT
PADDING CAST ABS COTTON 4X4 ST (CAST SUPPLIES) IMPLANT
PLATE DIST STD 5H LT (Plate) IMPLANT
POSITIONER HEAD 8X9X4 ADT (SOFTGOODS) ×1 IMPLANT
SCREW CORTEX LP 2.4 X 16 (Screw) IMPLANT
SCREW CORTEX LP 2.4X18 (Screw) IMPLANT
SCREW CORTICAL 2.4X24 (Screw) IMPLANT
SCREW KREULOCK 2.4X20 (Screw) IMPLANT
SCREW LOCK 18X2.4XVA NS LF TI (Screw) IMPLANT
SCREW LP TI 3.5X14MM (Screw) IMPLANT
SET BASIN LINEN APH (SET/KITS/TRAYS/PACK) ×1 IMPLANT
SLING ARM FOAM STRAP LRG (SOFTGOODS) IMPLANT
SPLINT J IMMOBILIZER 3X20FT (CAST SUPPLIES) ×1 IMPLANT
SUT 3-0 BLK 1X30 PSL (SUTURE) IMPLANT
SUT MON AB 2-0 SH27 (SUTURE) ×1 IMPLANT
SUT VIC AB 2-0 CT2 27 (SUTURE) IMPLANT
SYR 30ML LL (SYRINGE) IMPLANT
SYR BULB IRRIG 60ML STRL (SYRINGE) ×1 IMPLANT
TUBE SUCTION HIGH CAP CLEAR NV (SUCTIONS) IMPLANT
WATER STERILE IRR 1000ML POUR (IV SOLUTION) ×1 IMPLANT

## 2023-09-09 NOTE — Transfer of Care (Signed)
 Immediate Anesthesia Transfer of Care Note  Patient: Adam Tyler  Procedure(s) Performed: OPEN REDUCTION INTERNAL FIXATION (ORIF) DISTAL RADIUS FRACTURE (Left: Arm Upper)  Patient Location: PACU  Anesthesia Type:General  Level of Consciousness: sedated  Airway & Oxygen Therapy: Patient Spontanous Breathing and Patient connected to face mask oxygen  Post-op Assessment: Report given to RN and Post -op Vital signs reviewed and stable  Post vital signs: Reviewed and stable  Last Vitals:  Vitals Value Taken Time  BP 130/74 09/09/23 1406  Temp 98.5   Pulse 80 09/09/23 1408  Resp 15 09/09/23 1408  SpO2 98 % 09/09/23 1408  Vitals shown include unfiled device data.  Last Pain:  Vitals:   09/09/23 0929  TempSrc: Oral  PainSc: 7       Patients Stated Pain Goal: 9 (09/09/23 0929)  Complications: No notable events documented.

## 2023-09-09 NOTE — Anesthesia Procedure Notes (Signed)
 Procedure Name: Intubation Date/Time: 09/09/2023 11:35 AM  Performed by: Jeanette Caprice, CRNAPre-anesthesia Checklist: Patient identified, Emergency Drugs available, Suction available and Patient being monitored Patient Re-evaluated:Patient Re-evaluated prior to induction Oxygen Delivery Method: Circle system utilized Preoxygenation: Pre-oxygenation with 100% oxygen Induction Type: IV induction Laryngoscope Size: Mac and 3 Grade View: Grade II Tube type: Oral Tube size: 8.0 mm Number of attempts: 1 Airway Equipment and Method: Stylet Placement Confirmation: ETT inserted through vocal cords under direct vision, positive ETCO2 and breath sounds checked- equal and bilateral Secured at: 22 cm Tube secured with: Tape Dental Injury: Teeth and Oropharynx as per pre-operative assessment

## 2023-09-09 NOTE — Op Note (Signed)
 Orthopaedic Surgery Operative Note (CSN: [id number])  [name]  1956/01/09 Date of Surgery: 09/09/2023   Diagnoses:  Left distal radius fracture Closed left elbow dislocation Left first metacarpal shaft fracture  Procedure: ORIF of left distal radius fracture Manipulation of left elbow under anesthesia Splinting of left first metacarpal shaft fracture   Operative Finding Successful completion of the planned procedure.  Elbow was manipulated under anesthesia.  No further reduction was required.  Elbow remained reduced throughout the procedure.  This was confirmed under fluoroscopy before and after surgery.  Open reduction internal fixation of distal radius fracture, using a single volar base plate.  Closed reduction and splinting of left first metacarpal shaft fracture, without manipulation.   Post-Op Diagnosis: Same Surgeons:Primary: Adam Barre, MD Assistants: Rebeca Alert Location: [facility] OR ROOM 4 Anesthesia: General with local anesthesia Antibiotics: Ancef 2 g Tourniquet time:  Total Tourniquet Time Documented: Upper Arm (Left) - 101 minutes Total: Upper Arm (Left) - 101 minutes  Estimated Blood Loss: 30 cc Complications: None Specimens: None  Implants: Implant Name Type Inv. Item Serial No. Manufacturer Lot No. LRB No. Used Action  SCREW CORTEX LP 2.4 X 16 - [id number] Screw SCREW CORTEX LP 2.4 X 16  [employer] STERILE ON SET Left 1 Implanted  SCREW CORTEX LP 2.4X18 - [id number] Screw SCREW CORTEX LP 2.4X18  [employer] STERILE ON SET Left 1 Implanted  SCREW LP TI 3.5X14MM - [id number] Screw SCREW LP TI 3.5X14MM  [employer] STERILE ON SET Left 1 Implanted  SCREW CORTICAL 2.4X24 - [id number] Screw SCREW CORTICAL 2.4X24  [employer] STERILE ON SET Left 1 Implanted  5 HOLE LEFT VOLAR PLATE Plate   [employer] STERILE ON SET Left 1 Implanted  SCREW KREULOCK 2.4X20 - [id number] Screw SCREW KREULOCK 2.4X20  [employer] STERILE ON SET Left 1 Implanted  SCREW LOCK 18X2.4XVA  NS LF TI - [id number] Screw SCREW LOCK 18X2.4XVA NS LF TI  [employer] STERILE ON SET Left 2 Implanted  SCREW LP TI 3.5X14MM - [id number] Screw SCREW LP TI 3.5X14MM  [employer] STERILE ON SET Left 2 Implanted    Indications for Surgery:   Adam Tyler is a 68 y.o. male who fell and sustained multiple injuries to his left upper extremity.  He was evaluated the emergency department.  The left elbow dislocation was reduced under sedation in the emergency department.  He remained in a splint.  I saw him back in clinic in follow-up.  He had a left elbow dislocation, distal radius fracture, as well as a left first metacarpal shaft fracture.  Given the nature of the injury to the left arm, the distal radius fracture was length unstable.  As such, I recommended operative fixation.  Benefits and risks of operative and nonoperative management were discussed prior to surgery with the patient and informed consent form was completed.  Specific risks including infection, need for additional surgery, bleeding, nonunion, malunion, recurrence of dislocation of the elbow, persistent pain, stiffness, blood clots and more severe complications associated with anesthesia.  All questions were answered.  He elected to proceed.   Procedure:   The patient was identified properly. Informed consent was obtained and the surgical site was marked. The patient was taken to the OR where general anesthesia was induced.  The patient was positioned supine, with the arm on a hand table.    Prior to sterile prep and drape, fluoroscopy was brought in to fully evaluate the left arm.  The elbow remained  reduced, on both AP and lateral views.  These were confirmed and saved.  The wrist and first metacarpal shaft fracture were also evaluated.  The distal radius fracture was lengthened stable.  The metacarpal shaft fracture was stable, with minimal displacement.   The left arm was prepped and draped in the usual sterile fashion.  Timeout was  performed before the beginning of the case.  Tourniquet was used for the above duration.  He received 2 g of Ancef prior to making incision.  We plan to proceed with open reduction and internal fixation of a left distal radius fracture.  The FCR tendon was identified, we made along contusional incision through skin only, directly in line with the FCR tendon.  We dissected bluntly through subcutaneous tissue.  There were some small crossing veins which had to be cauterized.  The FCR tendon was identified, and we used a knife to incise the fascia overlying the tendon.  Once this incision had been extended proximally and distally, the tendon was mobilized ulnar.  We then dissected bluntly to the floor of the fascia, and once again split the fascia with a knife.  This was extended proximally distally.  At this point, we had obtained access to the volar radial shaft, and the mid forearm area.  Retractors were placed.  The quadratus musculature was identified.  This was dissected bluntly off the radial aspect of the distal radius.  We used a wood elevator to expose the distal radius.  The most distal fragment was shortened, and displaced dorsally.  We encountered some scar tissue, as well as fracture callus.  This was removed with a combination of rongeurs, curette and irrigation.  There were some small fragments of bone which were free of attachment, and these were removed.  This made our overall reduction a little bit more challenging.  Given the unstable nature of the fracture, I then proceeded to place traction using a digit grip.  This improved our alignment, and ability to maintain our fracture.  We held the fracture with traction and clamps, and evaluated our reduction under fluoroscopy.  Once we are satisfied with the overall appearance, we selected a 5 hole plate from the back table.  This was provisionally placed on the volar aspect of the distal radius.  The distal fragment wanted to displace dorsally.   As such, I made the decision to place our distal screws first, and then kickstand the plate in order to achieve our overall reduction, and volar angulation of the joint line.  The plate was held in place provisionally with a couple of K wires.  We then proceeded to place 2 nonlocking screws in the distal fragment.  We had good control of the distal fragment at this point.  We performed a final reduction maneuver, placed the plate down to the volar aspect of the shaft.  This was provisionally held in place.  Placement of the plate, as well as the initial screws was critically evaluated.  This did not breach the joint.  We then proceeded to place a single bicortical screw in the shaft, achieving excellent purchase.  Once again, fluoroscopy was introduced, and confirmed the overall positioning of the plate, screws as well as our reduction.  We were satisfied with reduction at this point.  We then turned our attention to the distal cluster.  We placed several additional locking screws, to achieve excellent fixation.  Finally, we placed multiple bicortical screws in the shaft to complete our fixation.  All retractors  and K wires were removed.  The distal radius was evaluated very closely under fluoroscopy.  We are satisfied with her improvements in alignment, and there were no screws which were too long.  We irrigated the wound copiously.  We closed the incision in a multilayer fashion with absorbable suture and 3-0 nylon.  Sterile dressing was placed.  Once again, fluoroscopy was brought into evaluate the elbow, as well as the left first metacarpal.  AP and lateral views of the elbow confirmed that the elbow had not dislocated again.  The first metacarpal shaft fracture remained in stable position.  We then proceeded to place a posterior slab splint, as well as a thumb spica splint to secure all 3 injuries.  He was placed in a sling.  Patient was awoken taken to PACU in stable condition.   Post-operative plan:   The patient will be NWB on the operative extremity Discharge home from the PACU once they have recovered DVT prophylaxis not indicated in this ambulatory upper extremity patient without significant risk factors.   He will resume his 81 mg of aspirin daily. Pain control with PRN pain medication preferring oral medicines.   Follow up plan will be scheduled in approximately 10-14 days for incision check and XR.

## 2023-09-09 NOTE — Anesthesia Preprocedure Evaluation (Signed)
 Anesthesia Evaluation  Patient identified by MRN, date of birth, ID band Patient awake    Reviewed: Allergy & Precautions, H&P , NPO status , Patient's Chart, lab work & pertinent test results, reviewed documented beta blocker date and time   Airway Mallampati: II  TM Distance: >3 FB Neck ROM: full    Dental no notable dental hx.    Pulmonary sleep apnea , COPD, Current Smoker   Pulmonary exam normal breath sounds clear to auscultation       Cardiovascular Exercise Tolerance: Good hypertension,  Rhythm:regular Rate:Normal     Neuro/Psych  PSYCHIATRIC DISORDERS Anxiety Depression     Neuromuscular disease    GI/Hepatic Neg liver ROS,GERD  ,,  Endo/Other  diabetes    Renal/GU negative Renal ROS  negative genitourinary   Musculoskeletal   Abdominal   Peds  Hematology negative hematology ROS (+)   Anesthesia Other Findings   Reproductive/Obstetrics negative OB ROS                             Anesthesia Physical Anesthesia Plan  ASA: 3  Anesthesia Plan: General and General LMA   Post-op Pain Management:    Induction:   PONV Risk Score and Plan: Ondansetron  Airway Management Planned:   Additional Equipment:   Intra-op Plan:   Post-operative Plan:   Informed Consent: I have reviewed the patients History and Physical, chart, labs and discussed the procedure including the risks, benefits and alternatives for the proposed anesthesia with the patient or authorized representative who has indicated his/her understanding and acceptance.     Dental Advisory Given  Plan Discussed with: CRNA  Anesthesia Plan Comments:        Anesthesia Quick Evaluation

## 2023-09-09 NOTE — Interval H&P Note (Signed)
 History and Physical Interval Note:  09/09/2023 11:17 AM  Adam Tyler  has presented today for surgery, with the diagnosis of Left distal radius fracture.  The various methods of treatment have been discussed with the patient and family. After consideration of risks, benefits and other options for treatment, the patient has consented to  Procedure(s): OPEN REDUCTION INTERNAL FIXATION (ORIF) DISTAL RADIUS FRACTURE (Left) as a surgical intervention.  The patient's history has been reviewed, patient examined, no change in status, stable for surgery.  I have reviewed the patient's chart and labs.  Questions were answered to the patient's satisfaction.    ORIF of left distal radius fracture Evaluation of left elbow under anesthesia Closed reduction and splinting of left 1st metacarpal shaft fracture   Oliver Barre

## 2023-09-09 NOTE — Discharge Instructions (Addendum)
 Mark A. Dallas Schimke, MD MS [facility] [address] [place],  [place]  [address] Phone: [phone] Fax: [phone]   POST-OPERATIVE INSTRUCTIONS - UPPER EXTREMITY   WOUND CARE Please keep splint clean dry and intact until followup.  You may shower on Post-Op Day #2.  You must keep splint dry during this process and may find that a plastic bag taped around the arm or alternatively a towel based bath may be a better option.   If you get your splint wet or if it is damaged please contact our clinic.  EXERCISES Due to your splint being in place you will not be able to bear weight through your extremity.   DO NOT PUT ANY WEIGHT ON YOUR OPERATIVE ARM   REGIONAL ANESTHESIA (NERVE BLOCKS) The anesthesia team may have performed a nerve block for you if safe in the setting of your care.  This is a great tool used to minimize pain.  Typically the block may start wearing off overnight but the long acting medicine may last for 3-4 days.  The nerve block wearing off can be a challenging period but please utilize your as needed pain medications to try and manage this period.    POST-OP MEDICATIONS- Multimodal approach to pain control  In general your pain will be controlled with a combination of substances.  Prescriptions unless otherwise discussed are electronically sent to your pharmacy.  This is a carefully made plan we use to minimize narcotic use.     - Meloxicam OR Celebrex - Anti-inflammatory medication taken on a scheduled basis  - Acetaminophen - Non-narcotic pain medicine taken on a scheduled basis   - Oxycodone - This is a strong narcotic, to be used only on an "as needed" basis for pain.  -  Zofran - take as needed for nausea   FOLLOW-UP If you develop a Fever (>101.5), Redness or Drainage from the surgical incision site, please call our office to arrange for an evaluation. Please call the office to schedule a follow-up appointment for your incision check  if you do not already have one, 10-14 days post-operatively.  IF YOU HAVE ANY QUESTIONS, PLEASE FEEL FREE TO CALL OUR OFFICE.  HELPFUL INFORMATION  If you had a block, it will wear off between 8-24 hrs postop typically.  This is period when your pain may go from nearly zero to the pain you would have had postop without the block.  This is an abrupt transition but nothing dangerous is happening.  You may take an extra dose of narcotic when this happens.  You should wean off your narcotic medicines as soon as you are able.  Most patients will be off or using minimal narcotics before their first postop appointment.   Elevating your leg will help with swelling and pain control.  You are encouraged to elevate your leg as much as possible in the first couple of weeks following surgery.  Imagine a drop of water on your toe, and your goal is to get that water back to your heart.  We suggest you use the pain medication the first night prior to going to bed, in order to ease any pain when the anesthesia wears off. You should avoid taking pain medications on an empty stomach as it will make you nauseous.  Do not drink alcoholic beverages or take illicit drugs when taking pain medications.  In most states it is against the law to drive while you are in a splint or  sling.  And certainly against the law to drive while taking narcotics.  You may return to work/school in the next couple of days when you feel up to it.   Pain medication may make you constipated.  Below are a few solutions to try in this order: Decrease the amount of pain medication if you aren't having pain. Drink lots of decaffeinated fluids. Drink prune juice and/or each dried prunes  If the first 3 don't work start with additional solutions Take Colace - an over-the-counter stool softener Take Senokot - an over-the-counter laxative Take Miralax - a stronger over-the-counter laxative   Post Anesthesia Home Care  Instructions  Activity: Get plenty of rest for the remainder of the day. A responsible individual must stay with you for 24 hours following the procedure.  For the next 24 hours, DO NOT: -Drive a car -Advertising copywriter -Drink alcoholic beverages -Take any medication unless instructed by your physician -Make any legal decisions or sign important papers.  Meals: Start with liquid foods such as gelatin or soup. Progress to regular foods as tolerated. Avoid greasy, spicy, heavy foods. If nausea and/or vomiting occur, drink only clear liquids until the nausea and/or vomiting subsides. Call your physician if vomiting continues.  Special Instructions/Symptoms: Your throat may feel dry or sore from the anesthesia or the breathing tube placed in your throat during surgery. If this causes discomfort, gargle with warm salt water. The discomfort should disappear within 24 hours.

## 2023-09-11 ENCOUNTER — Encounter (HOSPITAL_COMMUNITY): Payer: Self-pay | Admitting: Orthopedic Surgery

## 2023-09-13 NOTE — Anesthesia Postprocedure Evaluation (Signed)
 Anesthesia Post Note  Patient: JANCARLO BIERMANN  Procedure(s) Performed: OPEN REDUCTION INTERNAL FIXATION (ORIF) DISTAL RADIUS FRACTURE (Left: Arm Upper)  Patient location during evaluation: Phase II Anesthesia Type: General Level of consciousness: awake Pain management: pain level controlled Vital Signs Assessment: post-procedure vital signs reviewed and stable Respiratory status: spontaneous breathing and respiratory function stable Cardiovascular status: blood pressure returned to baseline and stable Postop Assessment: no headache and no apparent nausea or vomiting Anesthetic complications: no Comments: Late entry   No notable events documented.   Last Vitals:  Vitals:   09/09/23 1500 09/09/23 1505  BP: (!) 164/74 (!) 166/84  Pulse: 92 89  Resp: 18 12  Temp: 36.9 C   SpO2: 95% 94%    Last Pain:  Vitals:   09/10/23 1506  TempSrc:   PainSc: 5                  Windell Norfolk

## 2023-09-17 DIAGNOSIS — F419 Anxiety disorder, unspecified: Secondary | ICD-10-CM | POA: Insufficient documentation

## 2023-09-17 DIAGNOSIS — E1169 Type 2 diabetes mellitus with other specified complication: Secondary | ICD-10-CM | POA: Insufficient documentation

## 2023-09-20 ENCOUNTER — Other Ambulatory Visit (INDEPENDENT_AMBULATORY_CARE_PROVIDER_SITE_OTHER): Payer: Self-pay

## 2023-09-20 ENCOUNTER — Ambulatory Visit: Admitting: Orthopedic Surgery

## 2023-09-20 ENCOUNTER — Encounter: Payer: Self-pay | Admitting: Orthopedic Surgery

## 2023-09-20 DIAGNOSIS — S53105D Unspecified dislocation of left ulnohumeral joint, subsequent encounter: Secondary | ICD-10-CM

## 2023-09-20 DIAGNOSIS — S62245D Nondisplaced fracture of shaft of first metacarpal bone, left hand, subsequent encounter for fracture with routine healing: Secondary | ICD-10-CM

## 2023-09-20 DIAGNOSIS — S52532D Colles' fracture of left radius, subsequent encounter for closed fracture with routine healing: Secondary | ICD-10-CM

## 2023-09-20 DIAGNOSIS — S52532A Colles' fracture of left radius, initial encounter for closed fracture: Secondary | ICD-10-CM | POA: Diagnosis not present

## 2023-09-20 NOTE — Progress Notes (Signed)
 Orthopaedic Postop Note  Assessment: Adam Tyler is a 68 y.o. male s/p ORIF of left distal radius fracture, closed treatment of a first metacarpal shaft fracture and closed treatment of left elbow dislocation  DOS: 09/09/2023  Plan: He is recovering well.  His pain is improving.  He has some numbness to the left hand, primarily in the ulnar side of his hand.  Radiographs of the wrist remained stable.  The elbow remains reduced.  He has stiffness about the elbow, as well as the wrist.  He was transition to a thumb spica cast today, as well as a left elbow brace.  Would like to see him back in approximately 2-3 weeks.   Follow-up: Return in about 19 days (around 10/09/2023). XR at next visit: Left wrist and left elbow  Subjective:  Chief Complaint  Patient presents with   Follow-up for left wrist and elbow    ORIF left wrist, closed treatment of first metacarpal shaft fracture and closed elbow dislocation    History of Present Illness: Adam Tyler is a 68 y.o. male who presents following the above stated procedure.  He is doing well overall.  He is not taking any pain medicines.  He has some pain in the upper arm.  He also has some numbness in the small finger.  He notes stiffness in his left elbow.  Review of Systems: No fevers or chills + numbness or tingling No Chest Pain No shortness of breath   Objective: There were no vitals taken for this visit.  Physical Exam:  Alert and oriented.  No acute distress.  Ambulates with the assistance of a cane.  Evaluation of left elbow demonstrates diffuse swelling and bruising.  He has some tenderness to palpation.  He tolerates motion from 30-110 degrees with some discomfort.  He also has some swelling to the thumb, diffuse swelling and bruising around the left wrist.  Volar-based wrist incision is healing well.  No surrounding erythema or drainage.  Decreased sensation to the small finger and part of the ring finger.  Fingers warm and  well-perfused.  IMAGING: I personally ordered and reviewed the following images:  X-rays of the left elbow were obtained in clinic today.  These are compared to prior x-rays.  No acute injuries are noted.  No obvious fractures noted on available x-rays.  The elbow is reduced.  Impression: Normal-appearing left elbow x-ray, without subluxation or apparent fractures   X-rays of the left wrist were obtained in clinic today.  These are compared to prior x-rays.  Well-positioned distal radius fracture, with volar base plate.  Screws are not backing out.  There has been no subsidence of the fracture.  There is also a comminuted fracture of the first metacarpal, with minimal displacement.  No change in overall alignment, compared to prior x-rays.  Impression: Healing left distal radius fracture, without hardware failure.  Stable appearing left first metacarpal shaft fracture   Oliver Barre, MD 09/20/2023 10:46 AM

## 2023-09-20 NOTE — Patient Instructions (Signed)
General Cast Instructions  1.  You were placed in a cast in clinic today.  Please keep the cast material clean, dry and intact.  Please do not use anything to itch the under the cast.  If it gets itchy, you can consider taking benadryl, or similar medication.  If the cast material gets wet, place it on a towel and use a hair dryer on a low setting. 2.  Tylenol or Ibuprofen/Naproxen as needed.   3.  Recommend elevating your extremity as much as possible to help with swelling. 4.  F/u 2-3 weeks, cast off and repeat XR  

## 2023-10-09 ENCOUNTER — Other Ambulatory Visit (INDEPENDENT_AMBULATORY_CARE_PROVIDER_SITE_OTHER): Payer: Self-pay

## 2023-10-09 ENCOUNTER — Ambulatory Visit: Admitting: Orthopedic Surgery

## 2023-10-09 ENCOUNTER — Encounter: Payer: Self-pay | Admitting: Orthopedic Surgery

## 2023-10-09 DIAGNOSIS — S62245D Nondisplaced fracture of shaft of first metacarpal bone, left hand, subsequent encounter for fracture with routine healing: Secondary | ICD-10-CM

## 2023-10-09 DIAGNOSIS — S53105D Unspecified dislocation of left ulnohumeral joint, subsequent encounter: Secondary | ICD-10-CM

## 2023-10-09 DIAGNOSIS — S52532D Colles' fracture of left radius, subsequent encounter for closed fracture with routine healing: Secondary | ICD-10-CM

## 2023-10-09 NOTE — Patient Instructions (Signed)

## 2023-10-09 NOTE — Progress Notes (Signed)
 Orthopaedic Postop Note  Assessment: Adam Tyler is a 68 y.o. male s/p ORIF of left distal radius fracture, closed treatment of a first metacarpal shaft fracture and closed treatment of left elbow dislocation  DOS: 09/09/2023  Plan: Radiographs of the wrist, hand and elbow are stable.  He has stopped using his brace on his own.  He denies pain or issues with his function with the elbow.  He does continue have pain in the left wrist, as well as the hand.  He has some stiffness of the hand.  Due to the fact he is having a lot of pain, I would recommend another cast.  This was done in clinic today.  He will follow-up in 2 weeks.   Cast application - Left short arm cast   Verbal consent was obtained and the correct extremity was identified. A well padded, appropriately molded short arm cast was applied to the Left arm Fingers remained warm and well perfused.   There were no sharp edges Patient tolerated the procedure well Cast care instructions were provided     Follow-up: Return in about 2 weeks (around 10/23/2023). XR at next visit: Left wrist and left elbow  Subjective:  Chief Complaint  Patient presents with   Routine Post Op    DOS 09/09/23    History of Present Illness: Adam Tyler is a 68 y.o. male who presents following the above stated procedure.  He is doing well overall.  He states he has no problem with his left elbow.  He continues to have a lot of pain in the left wrist.  He has stopped using the elbow brace, but states he has no problems.  Occasional pain with some motions.   Review of Systems: No fevers or chills No numbness or tingling No Chest Pain No shortness of breath   Objective: There were no vitals taken for this visit.  Physical Exam:  Alert and oriented.  No acute distress.  Ambulates with the assistance of a cane.  Left elbow with minimal swelling.  No appreciable bruising.  Mild tenderness over the olecranon.  He has good range of motion  from 10-130 degrees with minimal discomfort.  Volar base surgical incision of the left wrist is healing.  No surrounding erythema or drainage.  Some residual bruising and swelling in the area.  No tenderness to palpation of the left thumb.  IMAGING: I personally ordered and reviewed the following images:  X-rays left elbow were obtained in clinic today.  These are compared to available x-rays.  Elbow is reduced.  There are some small avulsion fractures over the lateral elbow.  Some ossification over the medial elbow.  No evidence of callus formation.  Impression: Stable left elbow x-ray   X-rays of the left wrist were obtained in clinic today.  These are compared to prior x-rays.  Volar-based plate and screw construct over the distal radius remains in good position.  On the lateral, there has been no change in the joint line.  Screws are not backing out.  No subsidence of the fracture.  Dorsal comminution is in stable alignment.  Comminuted fracture of first metatarsal remains in unchanged alignment.  No dislocation.  Impression: Stable left distal radius fracture without hardware failure; stable nondisplaced comminuted fracture of the 1st metatarsal  Adam Frater, MD 10/09/2023 11:18 AM

## 2023-10-23 ENCOUNTER — Other Ambulatory Visit (INDEPENDENT_AMBULATORY_CARE_PROVIDER_SITE_OTHER): Payer: Self-pay

## 2023-10-23 ENCOUNTER — Encounter: Payer: Self-pay | Admitting: Orthopedic Surgery

## 2023-10-23 ENCOUNTER — Ambulatory Visit (INDEPENDENT_AMBULATORY_CARE_PROVIDER_SITE_OTHER): Admitting: Orthopedic Surgery

## 2023-10-23 DIAGNOSIS — S62245D Nondisplaced fracture of shaft of first metacarpal bone, left hand, subsequent encounter for fracture with routine healing: Secondary | ICD-10-CM

## 2023-10-23 DIAGNOSIS — S52532D Colles' fracture of left radius, subsequent encounter for closed fracture with routine healing: Secondary | ICD-10-CM

## 2023-10-23 DIAGNOSIS — S53105D Unspecified dislocation of left ulnohumeral joint, subsequent encounter: Secondary | ICD-10-CM

## 2023-10-23 NOTE — Progress Notes (Signed)
 Orthopaedic Postop Note  Assessment: YOUSSEF VELDE is a 68 y.o. male s/p ORIF of left distal radius fracture, closed treatment of a first metacarpal shaft fracture and closed treatment of left elbow dislocation  DOS: 09/09/2023  Plan: Overall, he feels better.  Radiographs all remain stable.  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  At this point, we will transition him to a brace.  Continue to use the brace at all times for the next 2 weeks.  Okay to remove for hygiene, and to work on gentle range of motion exercises for the next 2 weeks.  He states understanding.  No lifting heavier than a coffee cup.  I would like see him back in 1 month for repeat evaluation.  He can discontinue use of the elbow brace.    Follow-up: Return in about 4 weeks (around 11/20/2023). XR at next visit: Left wrist and left elbow  Subjective:  Chief Complaint  Patient presents with   Routine Post Op    L wrist DOS 09/09/23    History of Present Illness: SHASTA CAPSTICK is a 68 y.o. male who presents following the above stated procedure.  He has been wearing his left elbow brace.  He has tolerated the cast.  He notes stiffness in the hand.  Upon removal of the cast, he continues to have stiffness in the wrist.  He denies numbness and tingling.  He is not taking medicines.   Review of Systems: No fevers or chills No numbness or tingling No Chest Pain No shortness of breath   Objective: There were no vitals taken for this visit.  Physical Exam:  Alert and oriented.  No acute distress.  Ambulates with the assistance of a cane.  Left elbow without swelling.  No bruising.  Mild tenderness to palpation around the elbow.  Range of motion from 10-130 degrees.  Near full supination and pronation.  Left wrist and vision is healed.  No surrounding erythema or drainage.  Approximately 60 degree arc of motion in the wrist.  Fingers are stiff.  He is not able to make a fist.  Fingers are warm and  well-perfused.  Sensation is intact throughout the left hand.  IMAGING: I personally ordered and reviewed the following images:  Trays of the left elbow are obtained in clinic today.  These are compared to prior x-rays.  Elbow remains reduced.  Small avulsion fracture of the lateral elbow.  These have not changed in overall appearance.  No dislocations.  No bony lesions.  Impression: Stable left elbow x-ray following dislocation   X-rays of the left wrist were obtained in clinic today.  These are compared available x-rays.  Plate and screw construct in a volar distal radius remains intact.  Screws are not backing out.  There is disuse osteopenia at the distal aspect of the distal radius.  Fracture remains in stable alignment.  There has been no subsidence.  Screws are not backing out.  Comminuted fracture of the first metacarpal shaft is in stable alignment.  There is no further displacement.  Impression: Stable left distal radius fracture following ORIF, without hardware failure; stable first metacarpal shaft fracture  [name], MD 10/23/2023 11:45 AM

## 2023-10-23 NOTE — Patient Instructions (Signed)
 Use the brace at all times for the next 2 weeks.  Okay to remove for hygiene and exercises.  He should be removing the brace a couple of times a day to work on range of motion.  In 2 weeks, he can start transitioning out of the wrist brace.  Continue to work on range of motion of the left elbow.   Follow-up in 1 month.  If you continue to struggle with your range of motion, we can consider a hand therapist.   Hand Exercises  Hand exercises can be helpful for almost anyone. These exercises can strengthen the hands, improve flexibility and movement, and increase blood flow to the hands. These results can make work and daily tasks easier. Hand exercises can be especially helpful for people who have joint pain from arthritis or have nerve damage from overuse (carpal tunnel syndrome). These exercises can also help people who have injured a hand.  Exercises Most of these hand exercises are gentle stretching and motion exercises. It is usually safe to do them often throughout the day. Warming up your hands before exercise may help to reduce stiffness. You can do this with gentle massage or by placing your hands in warm water  for 10-15 minutes. It is normal to feel some stretching, pulling, tightness, or mild discomfort as you begin new exercises. This will gradually improve. Stop an exercise right away if you feel sudden, severe pain or your pain gets worse. Ask your health care provider which exercises are best for you. Knuckle bend or "claw" fist Stand or sit with your arm, hand, and all five fingers pointed straight up. Make sure to keep your wrist straight during the exercise. Gently bend your fingers down toward your palm until the tips of your fingers are touching the top of your palm. Keep your big knuckle straight and just bend the small knuckles in your fingers. Hold this position for 10 seconds. Straighten (extend) your fingers back to the starting position. Repeat this exercise 5-10 times  with each hand. Full finger fist Stand or sit with your arm, hand, and all five fingers pointed straight up. Make sure to keep your wrist straight during the exercise. Gently bend your fingers into your palm until the tips of your fingers are touching the middle of your palm. Hold this position for 10 seconds. Extend your fingers back to the starting position, stretching every joint fully. Repeat this exercise 5-10 times with each hand. Straight fist Stand or sit with your arm, hand, and all five fingers pointed straight up. Make sure to keep your wrist straight during the exercise. Gently bend your fingers at the big knuckle, where your fingers meet your hand, and the middle knuckle. Keep the knuckle at the tips of your fingers straight and try to touch the bottom of your palm. Hold this position for 10 seconds. Extend your fingers back to the starting position, stretching every joint fully. Repeat this exercise 5-10 times with each hand. Tabletop Stand or sit with your arm, hand, and all five fingers pointed straight up. Make sure to keep your wrist straight during the exercise. Gently bend your fingers at the big knuckle, where your fingers meet your hand, as far down as you can while keeping the small knuckles in your fingers straight. Think of forming a tabletop with your fingers. Hold this position for 10 seconds. Extend your fingers back to the starting position, stretching every joint fully. Repeat this exercise 5-10 times with each hand. Finger spread  Place your hand flat on a table with your palm facing down. Make sure your wrist stays straight as you do this exercise. Spread your fingers and thumb apart from each other as far as you can until you feel a gentle stretch. Hold this position for 10 seconds. Bring your fingers and thumb tight together again. Hold this position for 10 seconds. Repeat this exercise 5-10 times with each hand. Making circles Stand or sit with your arm,  hand, and all five fingers pointed straight up. Make sure to keep your wrist straight during the exercise. Make a circle by touching the tip of your thumb to the tip of your index finger. Hold for 10 seconds. Then open your hand wide. Repeat this motion with your thumb and each finger on your hand. Repeat this exercise 5-10 times with each hand. Thumb motion Sit with your forearm resting on a table and your wrist straight. Your thumb should be facing up toward the ceiling. Keep your fingers relaxed as you move your thumb. Lift your thumb up as high as you can toward the ceiling. Hold for 10 seconds. Bend your thumb across your palm as far as you can, reaching the tip of your thumb for the small finger (pinkie) side of your palm. Hold for 10 seconds. Repeat this exercise 5-10 times with each hand. Grip strengthening Hold a stress ball or other soft ball in the middle of your hand. Slowly increase the pressure, squeezing the ball as much as you can without causing pain. Think of bringing the tips of your fingers into the middle of your palm. All of your finger joints should bend when doing this exercise. Hold your squeeze for 10 seconds, then relax. Repeat this exercise 5-10 times with each hand.   Contact a health care provider if: Your hand pain or discomfort gets much worse when you do an exercise. Your hand pain or discomfort does not improve within 2 hours after you exercise. If you have any of these problems, stop doing these exercises right away. Do not do them again unless your health care provider says that you can.    Get help right away if: You develop sudden, severe hand pain or swelling. If this happens, stop doing these exercises right away. Do not do them again unless your health care provider says that you can. This information is not intended to replace advice given to you by your health care provider. Make sure you discuss any questions you have with your health care  provider.   Wrist Fracture Rehab Ask your health care provider which exercises are safe for you. Do exercises exactly as told by your health care provider and adjust them as directed. It is normal to feel mild stretching, pulling, tightness, or discomfort as you do these exercises. Stop right away if you feel sudden pain or your pain gets worse. Do not begin these exercises until told by your health care provider. Stretching and range-of-motion exercises These exercises warm up your muscles and joints and improve the movement and flexibility of your wrist and hand. These exercises also help to relieve pain,numbness, and tingling. Finger flexion and extension Sit or stand with your elbow at your side. Open and stretch your left / right fingers as wide as you can (extension). Hold this position for 10 seconds. Close your left / right fingers into a gentle fist (flexion). Hold this position for 10 seconds. Slowly return to the starting position. Repeat 10 times. Complete this exercise 1-2  times a day. Wrist flexion Bend your left / right elbow to a 90-degree angle (right angle) with your palm facing the floor. Bend your wrist forward so your fingers point toward the floor (flexion). Hold this position for 10 seconds. Slowly return to the starting position. Repeat 10 times. Complete this exercise 1-2 times a day. Wrist extension Bend your left / right elbow to a 90-degree angle (right angle) with your palm facing the floor. Bend your wrist backward so your fingers point toward the ceiling (extension). Hold this position for 10 seconds. Slowly return to the starting position. Repeat 10 times. Complete this exercise 1-2 times a day. Ulnar deviation Bend your left / right elbow to a 90-degree angle (right angle), and rest your forearm on a table with your palm facing down. Keeping your hand flat on the table, bend your left / right wrist toward your small finger (pinkie). This is ulnar  deviation. Hold this position for 10 seconds. Slowly return to the starting position. Repeat 10 times. Complete this exercise 1-2 times a day. Radial deviation Bend your left / right elbow to a 90-degree angle (right angle), and rest your forearm on a table with your palm facing down. Keeping your hand flat on the table, bend your left / right wrist toward your thumb. This is radial deviation. Hold this position for 10 seconds. Slowly return to the starting position. Repeat 10 times. Complete this exercise 1-2 times a day. Forearm rotation, supination Stand or sit with your left / right elbow bent to a 90-degree angle (right angle) at your side. Position your forearm so that the thumb is facing the ceiling (neutral position). Turn (rotate) your palm up toward the ceiling (supination), stopping when you feel a gentle stretch. Hold this position for 10 seconds. Slowly return to the starting position. Repeat 10 times. Complete this exercise 1-2 times a day. Forearm rotation, pronation Stand or sit with your left / right elbow bent to a 90-degree angle (right angle) at your side. Position your forearm so that the thumb is facing the ceiling (neutral position). Turn (rotate) your palm down toward the floor (pronation), stopping when you feel a gentle stretch. Hold this position for 10 seconds. Slowly return to the starting position. Repeat 10 times. Complete this exercise 1-2 times a day. Wrist flexion stretch  Extend your left / right arm in front of you and turn your palm down toward the floor. If told by your health care provider, bend your left / right arm to a 90-degree angle (right angle) at your side. Using your uninjured hand, gently press over the back of your left / right hand to bend your wrist and fingers toward the floor (flexion). Go as far as you can to feel a stretch without causing pain. Hold this position for 10 seconds. Slowly return to the starting position. Repeat 10  times. Complete this exercise 1-2 times a day. Wrist extension stretch  Extend your left / right arm in front of you and turn your palm up toward the ceiling. If told by your health care provider, bend your left / right arm to a 90-degree angle (right angle) at your side. Using your uninjured hand, gently press over the palm of your left / right hand to bend your wrist and fingers toward the floor (extension). Go as far as you can to feel a stretch without causing pain. Hold this position for 10 seconds. Slowly return to the starting position. Repeat 10 times. Complete this  exercise 1-2 times a day. Forearm rotation stretch, supination Stand or sit with your arms at your sides. Bend your left / right elbow to a 90-degree angle (right angle). Using your uninjured hand, turn your left / right palm up toward the ceiling (assisted supination) until you feel a gentle stretch in the inside of your forearm. Hold this position for 10 seconds. Slowly return to the starting position. Repeat 10 times. Complete this exercise 1-2 times a day. Forearm rotation stretch, pronation Stand or sit with your arms at your sides. Bend your left / right elbow to a 90-degree angle (right angle). Using your uninjured hand, turn your left / right palm down toward the floor (assisted pronation) until you feel a gentle stretch in the top of your forearm. Hold this position for 10 seconds. Slowly return to the starting position. Repeat 10 times. Complete this exercise 1-2 times a day. Strengthening exercises These exercises build strength and endurance in your wrist and hand. Enduranceis the ability to use your muscles for a long time, even after they get tired. Wrist flexion Sit with your left / right forearm supported on a table. Your elbow should be at waist height. Rest your hand over the edge of the table, palm up. Gently grasp a 5 lb / kg weight (can of soup). Or, hold an exercise band or tube in both hands,  keeping your hands at the same level and hip distance apart. There should be slight tension in the exercise band or tube. Without moving your forearm or elbow, slowly bend your wrist up toward the ceiling (wrist flexion). Hold this position for 10 seconds. Slowly return to the starting position. Repeat 10 times. Complete this exercise 1-2 times a day. Wrist extension Sit with your left / right forearm supported on a table. Your elbow should be at waist height. Rest your hand over the edge of the table, palm down. Gently grasp a 5 lb / kg weight. Or, hold an exercise band or tube in both hands, keeping your hands at the same level and hip distance apart. There should be slight tension in the exercise band or tube. Without moving your forearm or elbow, slowly curl your hand up toward the ceiling (extension). Hold this position for 10 seconds. Slowly return to the starting position. Repeat 10 times. Complete this exercise 1-2 times a day. Forearm rotation, supination  Sit with your left / right forearm supported on a table. Your elbow should be at waist height. Rest your hand over the edge of the table, palm down. Gently grasp a lightweight hammer near the head. As this exercise gets easier for you, try holding the hammer farther down the handle. Without moving your elbow, slowly turn (rotate) your palm up toward the ceiling (supination). Hold this position for 10 seconds. Slowly return to the starting position. Repeat 10 times. Complete this exercise 1-2 times a day. Forearm rotation, pronation  Sit with your left / right forearm supported on a table. Your elbow should be at waist height. Rest your hand over the edge of the table, palm up. Gently grasp a lightweight hammer near the head. As this exercise gets easier for you, try holding the hammer farther down the handle. Without moving your elbow, slowly turn (rotate) your palm down toward the floor (pronation). Hold this position for 10  seconds. Slowly return to the starting position. Repeat 10 times. Complete this exercise 1-2 times a day. Grip strengthening  Hold one of these items in your  left / right hand: a dense sponge, a stress ball, or a large, rolled sock. Slowly squeeze the object as hard as you can without increasing any pain. Hold your squeeze for 10 seconds. Slowly release your grip. Repeat 10 times. Complete this exercise 1-2 times a day. This information is not intended to replace advice given to you by your health care provider. Make sure you discuss any questions you have with your healthcare provider. Document Revised: 10/15/2019 Document Reviewed: 10/15/2019 [employer] Patient Education  2022 [employer].

## 2023-11-04 ENCOUNTER — Other Ambulatory Visit: Payer: Medicare HMO

## 2023-11-04 DIAGNOSIS — N138 Other obstructive and reflux uropathy: Secondary | ICD-10-CM

## 2023-11-04 DIAGNOSIS — R972 Elevated prostate specific antigen [PSA]: Secondary | ICD-10-CM

## 2023-11-05 LAB — PSA: Prostate Specific Ag, Serum: 7.7 ng/mL — ABNORMAL HIGH (ref 0.0–4.0)

## 2023-11-08 ENCOUNTER — Ambulatory Visit: Payer: Medicare HMO | Admitting: Urology

## 2023-11-08 ENCOUNTER — Encounter: Payer: Self-pay | Admitting: Urology

## 2023-11-08 VITALS — BP 128/81 | HR 80

## 2023-11-08 DIAGNOSIS — N401 Enlarged prostate with lower urinary tract symptoms: Secondary | ICD-10-CM

## 2023-11-08 DIAGNOSIS — R972 Elevated prostate specific antigen [PSA]: Secondary | ICD-10-CM

## 2023-11-08 DIAGNOSIS — N138 Other obstructive and reflux uropathy: Secondary | ICD-10-CM

## 2023-11-08 DIAGNOSIS — N529 Male erectile dysfunction, unspecified: Secondary | ICD-10-CM

## 2023-11-08 LAB — URINALYSIS, ROUTINE W REFLEX MICROSCOPIC
Bilirubin, UA: NEGATIVE
Ketones, UA: NEGATIVE
Leukocytes,UA: NEGATIVE
Nitrite, UA: NEGATIVE
RBC, UA: NEGATIVE
Specific Gravity, UA: 1.015 (ref 1.005–1.030)
Urobilinogen, Ur: 0.2 mg/dL (ref 0.2–1.0)
pH, UA: 6 (ref 5.0–7.5)

## 2023-11-08 MED ORDER — TAMSULOSIN HCL 0.4 MG PO CAPS: 0.4000 mg | ORAL_CAPSULE | Freq: Every day | ORAL | 11 refills | Status: AC

## 2023-11-08 NOTE — Progress Notes (Signed)
 11/08/2023 12:06 PM   Adam Tyler 01-06-1956 [id number]  Referring provider: Vilinda Grays [address] [name],  [place] [address]  Elevated PSA   HPI: Mr Adam Tyler is a 68yo here for followup for BPH, elevated PSA and hydroceles. He is not bothered by his hydroceles. IPSS 10 QOl 1 on flomax  0.4mg  daily. Urine stream strong. PSA decreased to 7.7 from 8.5.    PMH: Past Medical History:  Diagnosis Date   Acute medial meniscal injury of knee    left   Anxiety    h/o panic attacks from " stress"   Arthritis    Arthritis, septic, knee (HCC)    Blood glucose elevated    Cancer (HCC) 2007   melanoma forehead    Diabetes mellitus without complication (HCC)    HBA1c 04/20/15 was 7.3    GERD (gastroesophageal reflux disease)    Hypertension    Infection    left knee   Sciatica of left side    per pt  "buldging disc- lumbar"   Sleep apnea    pt denies   Upper respiratory infection 05/17/13   no fever since 05/18/13    Surgical History: Past Surgical History:  Procedure Laterality Date   CHONDROPLASTY Left 03/09/2015   Procedure: CHONDROPLASTY;  Surgeon: Marlena Sima, MD;  Location: [facility];  Service: Orthopedics;  Laterality: Left;   HAND SURGERY     HERNIA REPAIR     KNEE ARTHROSCOPY Left 03/18/2015   Procedure: ARTHROSCOPIC, LAVAGE, SYNOVECTOMY LEFT KNEE ;  Surgeon: Marlena Sima, MD;  Location: [facility] OR;  Service: Orthopedics;  Laterality: Left;   KNEE ARTHROSCOPY Left 03/23/2015   Procedure: LEFT KNEE I&D WITH LAVAGE;  Surgeon: Marlena Sima, MD;  Location: [facility];  Service: Orthopedics;  Laterality: Left;   KNEE ARTHROSCOPY Left 04/22/2015   Procedure: LEFT KNEE ARTHROSCOPY WITH LAVAGE AND DRAINING WITH SYNOVECTOMY;  Surgeon: Marlena Sima, MD;  Location: [facility];  Service: Orthopedics;  Laterality: Left;   KNEE ARTHROSCOPY WITH MEDIAL MENISECTOMY Left 03/09/2015   Procedure: KNEE ARTHROSCOPY WITH  PARTIAL MEDIAL MENISECTOMY;   Surgeon: Marlena Sima, MD;  Location: [facility];  Service: Orthopedics;  Laterality: Left;   MASS EXCISION Right 06/19/2013   Procedure: EXCISION MASS RIGHT POSTERIOR NECK;  Surgeon: Keitha Pata, MD;  Location: [facility];  Service: General;  Laterality: Right;   OPEN REDUCTION INTERNAL FIXATION (ORIF) DISTAL RADIAL FRACTURE Left 09/09/2023   Procedure: OPEN REDUCTION INTERNAL FIXATION (ORIF) DISTAL RADIUS FRACTURE;  Surgeon: [name], MD;  Location: [facility];  Service: Orthopedics;  Laterality: Left;   SKIN GRAFT Right    TONSILLECTOMY      Home Medications:  Allergies as of 11/08/2023       Reactions   Strawberry Extract Hives, Nausea And Vomiting   Adhesive [tape] Other (See Comments)   Leg raw and red, burning from adhesive bandage 03/18/15   Other Other (See Comments)   Leg raw and red, burning from adhesive bandage 03/18/15        Medication List        Accurate as of Nov 08, 2023 12:06 PM. If you have any questions, ask your nurse or doctor.          aspirin  EC 81 MG tablet Take 81 mg by mouth in the morning. Swallow whole.   atorvastatin 20 MG tablet Commonly known as: LIPITOR Take 20 mg by mouth at bedtime.  carvedilol 6.25 MG tablet Commonly known as: COREG Take 6.25 mg by mouth 2 (two) times daily.   cephALEXin  500 MG capsule Commonly known as: KEFLEX  Take 1 capsule (500 mg total) by mouth 2 (two) times daily.   dimenhyDRINATE 50 MG tablet Commonly known as: DRAMAMINE Take 50 mg by mouth every 8 (eight) hours as needed for dizziness or nausea.   EPINEPHrine  0.3 mg/0.3 mL Soaj injection Commonly known as: EPI-PEN Inject 0.3 mg into the skin as needed.   glipiZIDE 5 MG tablet Commonly known as: GLUCOTROL Take 5-10 mg by mouth See admin instructions. Take 2 tablets (10 mg) by mouth in the morning & take 1 tablet (5 mg) by mouth at night.   hydrochlorothiazide  25 MG tablet Commonly known as: HYDRODIURIL  Take 25 mg by mouth daily.    metFORMIN  500 MG 24 hr tablet Commonly known as: GLUCOPHAGE -XR Take 500 mg by mouth at bedtime.   olmesartan 40 MG tablet Commonly known as: BENICAR Take 40 mg by mouth every evening.   ONE TOUCH ULTRA TEST test strip Generic drug: glucose blood   OneTouch Delica Lancets 33G Misc CHECK BLOOD SUGAR TWICE A DAY   pantoprazole  40 MG tablet Commonly known as: PROTONIX  Take 40 mg by mouth in the morning.   ProAir HFA 108 (90 Base) MCG/ACT inhaler Generic drug: albuterol Inhale 1-2 puffs into the lungs every 6 (six) hours as needed for wheezing or shortness of breath.   tamsulosin  0.4 MG Caps capsule Commonly known as: FLOMAX  Take 1 capsule (0.4 mg total) by mouth daily. What changed: when to take this   venlafaxine  XR 75 MG 24 hr capsule Commonly known as: EFFEXOR -XR Take 75 mg by mouth daily after supper.        Allergies:  Allergies  Allergen Reactions   Strawberry Extract Hives and Nausea And Vomiting   Adhesive [Tape] Other (See Comments)    Leg raw and red, burning from adhesive bandage 03/18/15   Other Other (See Comments)    Leg raw and red, burning from adhesive bandage 03/18/15    Family History: Family History  Problem Relation Age of Onset   Cancer Mother        lymphnode   Stroke Father     Social History:  reports that he has been smoking cigarettes. He has a 4 pack-year smoking history. He has never used smokeless tobacco. He reports current alcohol use of about 7.0 standard drinks of alcohol per week. He reports current drug use. Drug: Marijuana.  ROS: All other review of systems were reviewed and are negative except what is noted above in HPI  Physical Exam: BP 128/81   Pulse 80   Constitutional:  Alert and oriented, No acute distress. HEENT: [place] AT, moist mucus membranes.  Trachea midline, no masses. Cardiovascular: No clubbing, cyanosis, or edema. Respiratory: Normal respiratory effort, no increased work of breathing. GI: Abdomen is soft,  nontender, nondistended, no abdominal masses GU: No CVA tenderness.  Lymph: No cervical or inguinal lymphadenopathy. Skin: No rashes, bruises or suspicious lesions. Neurologic: Grossly intact, no focal deficits, moving all 4 extremities. Psychiatric: Normal mood and affect.  Laboratory Data: Lab Results  Component Value Date   WBC 8.7 09/05/2023   HGB 12.3 (L) 09/05/2023   HCT 35.8 (L) 09/05/2023   MCV 94.0 09/05/2023   PLT 421 (H) 09/05/2023    Lab Results  Component Value Date   CREATININE 0.76 09/05/2023    No results found for: "PSA"  No results  found for: "TESTOSTERONE"  Lab Results  Component Value Date   HGBA1C 10.6 (H) 09/05/2023    Urinalysis    Component Value Date/Time   APPEARANCEUR Clear 07/31/2023 1137   GLUCOSEU 1+ (A) 07/31/2023 1137   BILIRUBINUR Negative 07/31/2023 1137   PROTEINUR Trace 07/31/2023 1137   NITRITE Negative 07/31/2023 1137   LEUKOCYTESUR Negative 07/31/2023 1137    Lab Results  Component Value Date   LABMICR Comment 07/31/2023    Pertinent Imaging: Prostate MRI: Images reviewed and disucssed with the patient  No results found for this or any previous visit.  No results found for this or any previous visit.  No results found for this or any previous visit.  No results found for this or any previous visit.  No results found for this or any previous visit.  No results found for this or any previous visit.  No results found for this or any previous visit.  No results found for this or any previous visit.   Assessment & Plan:    1. Elevated PSA (Primary) Followup 1 year with a PSA - Urinalysis, Routine w reflex microscopic  2. BPH with obstruction/lower urinary tract symptoms -continue flomax  0.4mg  daily  3. Organic impotence Continue flomax  0.4mg  daily.    No follow-ups on file.  Johnie Nailer, MD  [facility]rology [place]

## 2023-11-08 NOTE — Patient Instructions (Signed)

## 2023-11-13 ENCOUNTER — Other Ambulatory Visit: Payer: Self-pay | Admitting: Orthopedic Surgery

## 2023-11-21 ENCOUNTER — Other Ambulatory Visit: Payer: Self-pay | Admitting: Orthopedic Surgery

## 2023-11-21 ENCOUNTER — Ambulatory Visit: Admitting: Orthopedic Surgery

## 2023-11-21 ENCOUNTER — Other Ambulatory Visit (INDEPENDENT_AMBULATORY_CARE_PROVIDER_SITE_OTHER): Payer: Self-pay

## 2023-11-21 ENCOUNTER — Encounter: Payer: Self-pay | Admitting: Orthopedic Surgery

## 2023-11-21 DIAGNOSIS — S52532D Colles' fracture of left radius, subsequent encounter for closed fracture with routine healing: Secondary | ICD-10-CM

## 2023-11-21 DIAGNOSIS — S62245D Nondisplaced fracture of shaft of first metacarpal bone, left hand, subsequent encounter for fracture with routine healing: Secondary | ICD-10-CM

## 2023-11-21 NOTE — Patient Instructions (Signed)
Hand Exercises  Hand exercises can be helpful for almost anyone. These exercises can strengthen the hands, improve flexibility and movement, and increase blood flow to the hands. These results can make work and daily tasks easier. Hand exercises can be especially helpful for people who have joint pain from arthritis or have nerve damage from overuse (carpal tunnel syndrome). These exercises can also help people who have injured a hand.  Exercises Most of these hand exercises are gentle stretching and motion exercises. It is usually safe to do them often throughout the day. Warming up your hands before exercise may help to reduce stiffness. You can do this with gentle massage or by placing your hands in warm water for 10-15 minutes. It is normal to feel some stretching, pulling, tightness, or mild discomfort as you begin new exercises. This will gradually improve. Stop an exercise right away if you feel sudden, severe pain or your pain gets worse. Ask your health care provider which exercises are best for you. Knuckle bend or "claw" fist Stand or sit with your arm, hand, and all five fingers pointed straight up. Make sure to keep your wrist straight during the exercise. Gently bend your fingers down toward your palm until the tips of your fingers are touching the top of your palm. Keep your big knuckle straight and just bend the small knuckles in your fingers. Hold this position for 10 seconds. Straighten (extend) your fingers back to the starting position. Repeat this exercise 5-10 times with each hand. Full finger fist Stand or sit with your arm, hand, and all five fingers pointed straight up. Make sure to keep your wrist straight during the exercise. Gently bend your fingers into your palm until the tips of your fingers are touching the middle of your palm. Hold this position for 10 seconds. Extend your fingers back to the starting position, stretching every joint fully. Repeat this exercise  5-10 times with each hand. Straight fist Stand or sit with your arm, hand, and all five fingers pointed straight up. Make sure to keep your wrist straight during the exercise. Gently bend your fingers at the big knuckle, where your fingers meet your hand, and the middle knuckle. Keep the knuckle at the tips of your fingers straight and try to touch the bottom of your palm. Hold this position for 10 seconds. Extend your fingers back to the starting position, stretching every joint fully. Repeat this exercise 5-10 times with each hand. Tabletop Stand or sit with your arm, hand, and all five fingers pointed straight up. Make sure to keep your wrist straight during the exercise. Gently bend your fingers at the big knuckle, where your fingers meet your hand, as far down as you can while keeping the small knuckles in your fingers straight. Think of forming a tabletop with your fingers. Hold this position for 10 seconds. Extend your fingers back to the starting position, stretching every joint fully. Repeat this exercise 5-10 times with each hand. Finger spread Place your hand flat on a table with your palm facing down. Make sure your wrist stays straight as you do this exercise. Spread your fingers and thumb apart from each other as far as you can until you feel a gentle stretch. Hold this position for 10 seconds. Bring your fingers and thumb tight together again. Hold this position for 10 seconds. Repeat this exercise 5-10 times with each hand. Making circles Stand or sit with your arm, hand, and all five fingers pointed straight up. Make   sure to keep your wrist straight during the exercise. Make a circle by touching the tip of your thumb to the tip of your index finger. Hold for 10 seconds. Then open your hand wide. Repeat this motion with your thumb and each finger on your hand. Repeat this exercise 5-10 times with each hand. Thumb motion Sit with your forearm resting on a table and your wrist  straight. Your thumb should be facing up toward the ceiling. Keep your fingers relaxed as you move your thumb. Lift your thumb up as high as you can toward the ceiling. Hold for 10 seconds. Bend your thumb across your palm as far as you can, reaching the tip of your thumb for the small finger (pinkie) side of your palm. Hold for 10 seconds. Repeat this exercise 5-10 times with each hand. Grip strengthening Hold a stress ball or other soft ball in the middle of your hand. Slowly increase the pressure, squeezing the ball as much as you can without causing pain. Think of bringing the tips of your fingers into the middle of your palm. All of your finger joints should bend when doing this exercise. Hold your squeeze for 10 seconds, then relax. Repeat this exercise 5-10 times with each hand.   Contact a health care provider if: Your hand pain or discomfort gets much worse when you do an exercise. Your hand pain or discomfort does not improve within 2 hours after you exercise. If you have any of these problems, stop doing these exercises right away. Do not do them again unless your health care provider says that you can.    Get help right away if: You develop sudden, severe hand pain or swelling. If this happens, stop doing these exercises right away. Do not do them again unless your health care provider says that you can. This information is not intended to replace advice given to you by your health care provider. Make sure you discuss any questions you have with your health care provider.     Wrist Fracture Rehab Ask your health care provider which exercises are safe for you. Do exercises exactly as told by your health care provider and adjust them as directed. It is normal to feel mild stretching, pulling, tightness, or discomfort as you do these exercises. Stop right away if you feel sudden pain or your pain gets worse. Do not begin these exercises until told by your health care  provider. Stretching and range-of-motion exercises These exercises warm up your muscles and joints and improve the movement and flexibility of your wrist and hand. These exercises also help to relieve pain,numbness, and tingling. Finger flexion and extension Sit or stand with your elbow at your side. Open and stretch your left / right fingers as wide as you can (extension). Hold this position for 10 seconds. Close your left / right fingers into a gentle fist (flexion). Hold this position for 10 seconds. Slowly return to the starting position. Repeat 10 times. Complete this exercise 1-2 times a day. Wrist flexion Bend your left / right elbow to a 90-degree angle (right angle) with your palm facing the floor. Bend your wrist forward so your fingers point toward the floor (flexion). Hold this position for 10 seconds. Slowly return to the starting position. Repeat 10 times. Complete this exercise 1-2 times a day. Wrist extension Bend your left / right elbow to a 90-degree angle (right angle) with your palm facing the floor. Bend your wrist backward so your fingers point toward   the ceiling (extension). Hold this position for 10 seconds. Slowly return to the starting position. Repeat 10 times. Complete this exercise 1-2 times a day. Ulnar deviation Bend your left / right elbow to a 90-degree angle (right angle), and rest your forearm on a table with your palm facing down. Keeping your hand flat on the table, bend your left / right wrist toward your small finger (pinkie). This is ulnar deviation. Hold this position for 10 seconds. Slowly return to the starting position. Repeat 10 times. Complete this exercise 1-2 times a day. Radial deviation Bend your left / right elbow to a 90-degree angle (right angle), and rest your forearm on a table with your palm facing down. Keeping your hand flat on the table, bend your left / right wrist toward your thumb. This is radial deviation. Hold this  position for 10 seconds. Slowly return to the starting position. Repeat 10 times. Complete this exercise 1-2 times a day. Forearm rotation, supination Stand or sit with your left / right elbow bent to a 90-degree angle (right angle) at your side. Position your forearm so that the thumb is facing the ceiling (neutral position). Turn (rotate) your palm up toward the ceiling (supination), stopping when you feel a gentle stretch. Hold this position for 10 seconds. Slowly return to the starting position. Repeat 10 times. Complete this exercise 1-2 times a day. Forearm rotation, pronation Stand or sit with your left / right elbow bent to a 90-degree angle (right angle) at your side. Position your forearm so that the thumb is facing the ceiling (neutral position). Turn (rotate) your palm down toward the floor (pronation), stopping when you feel a gentle stretch. Hold this position for 10 seconds. Slowly return to the starting position. Repeat 10 times. Complete this exercise 1-2 times a day. Wrist flexion stretch  Extend your left / right arm in front of you and turn your palm down toward the floor. If told by your health care provider, bend your left / right arm to a 90-degree angle (right angle) at your side. Using your uninjured hand, gently press over the back of your left / right hand to bend your wrist and fingers toward the floor (flexion). Go as far as you can to feel a stretch without causing pain. Hold this position for 10 seconds. Slowly return to the starting position. Repeat 10 times. Complete this exercise 1-2 times a day. Wrist extension stretch  Extend your left / right arm in front of you and turn your palm up toward the ceiling. If told by your health care provider, bend your left / right arm to a 90-degree angle (right angle) at your side. Using your uninjured hand, gently press over the palm of your left / right hand to bend your wrist and fingers toward the floor (extension).  Go as far as you can to feel a stretch without causing pain. Hold this position for 10 seconds. Slowly return to the starting position. Repeat 10 times. Complete this exercise 1-2 times a day. Forearm rotation stretch, supination Stand or sit with your arms at your sides. Bend your left / right elbow to a 90-degree angle (right angle). Using your uninjured hand, turn your left / right palm up toward the ceiling (assisted supination) until you feel a gentle stretch in the inside of your forearm. Hold this position for 10 seconds. Slowly return to the starting position. Repeat 10 times. Complete this exercise 1-2 times a day. Forearm rotation stretch, pronation Stand or   sit with your arms at your sides. Bend your left / right elbow to a 90-degree angle (right angle). Using your uninjured hand, turn your left / right palm down toward the floor (assisted pronation) until you feel a gentle stretch in the top of your forearm. Hold this position for 10 seconds. Slowly return to the starting position. Repeat 10 times. Complete this exercise 1-2 times a day. Strengthening exercises These exercises build strength and endurance in your wrist and hand. Enduranceis the ability to use your muscles for a long time, even after they get tired. Wrist flexion Sit with your left / right forearm supported on a table. Your elbow should be at waist height. Rest your hand over the edge of the table, palm up. Gently grasp a 5 lb / kg weight (can of soup). Or, hold an exercise band or tube in both hands, keeping your hands at the same level and hip distance apart. There should be slight tension in the exercise band or tube. Without moving your forearm or elbow, slowly bend your wrist up toward the ceiling (wrist flexion). Hold this position for 10 seconds. Slowly return to the starting position. Repeat 10 times. Complete this exercise 1-2 times a day. Wrist extension Sit with your left / right forearm supported  on a table. Your elbow should be at waist height. Rest your hand over the edge of the table, palm down. Gently grasp a 5 lb / kg weight. Or, hold an exercise band or tube in both hands, keeping your hands at the same level and hip distance apart. There should be slight tension in the exercise band or tube. Without moving your forearm or elbow, slowly curl your hand up toward the ceiling (extension). Hold this position for 10 seconds. Slowly return to the starting position. Repeat 10 times. Complete this exercise 1-2 times a day. Forearm rotation, supination  Sit with your left / right forearm supported on a table. Your elbow should be at waist height. Rest your hand over the edge of the table, palm down. Gently grasp a lightweight hammer near the head. As this exercise gets easier for you, try holding the hammer farther down the handle. Without moving your elbow, slowly turn (rotate) your palm up toward the ceiling (supination). Hold this position for 10 seconds. Slowly return to the starting position. Repeat 10 times. Complete this exercise 1-2 times a day. Forearm rotation, pronation  Sit with your left / right forearm supported on a table. Your elbow should be at waist height. Rest your hand over the edge of the table, palm up. Gently grasp a lightweight hammer near the head. As this exercise gets easier for you, try holding the hammer farther down the handle. Without moving your elbow, slowly turn (rotate) your palm down toward the floor (pronation). Hold this position for 10 seconds. Slowly return to the starting position. Repeat 10 times. Complete this exercise 1-2 times a day. Grip strengthening  Hold one of these items in your left / right hand: a dense sponge, a stress ball, or a large, rolled sock. Slowly squeeze the object as hard as you can without increasing any pain. Hold your squeeze for 10 seconds. Slowly release your grip. Repeat 10 times. Complete this exercise 1-2  times a day. This information is not intended to replace advice given to you by your health care provider. Make sure you discuss any questions you have with your healthcare provider. Document Revised: 10/15/2019 Document Reviewed: 10/15/2019 [employer] Patient Education    2022 [employer]  

## 2023-11-21 NOTE — Progress Notes (Signed)
 Orthopaedic Postop Note  Assessment: Adam Tyler is a 68 y.o. male s/p ORIF of left distal radius fracture, closed treatment of a first metacarpal shaft fracture and closed treatment of left elbow dislocation  DOS: 09/09/2023  Plan: Mr. Amescua is doing better.  He has excellent left elbow range of motion.  He is having some difficulty regaining the range of motion in his index and long fingers.  He notes some stiffness in the left thumb.  In addition, he has some residual pain and stiffness in the wrist.  Provided reassurance that he will continue to regain his motion.  He should continue to work on his motion.  In regards to the pain he is having in the left shoulder, this could be pain from the rotator cuff after his fall.  However, he does have decent range of motion.  If this continues, we can consider an injection in the left shoulder.  I recommended exercise at home.  He should consider formal hand therapy if he continues to struggle.  I would like see him back in clinic in about a month.   Follow-up: Return in about 4 weeks (around 12/19/2023). XR at next visit: Left wrist and left elbow  Subjective:  Chief Complaint  Patient presents with   Routine Post Op    L Wrist DOS: 09/09/2023    History of Present Illness: Adam Tyler is a 68 y.o. male who presents following the above stated procedure.  He is no longer using a brace on his elbow.  He continues to use the removable wrist brace.  He has been working on motion.  No issues with his incision.  He still has some residual stiffness in the left wrist.  In addition, he continues to have stiffness of the index and long fingers.  He reports that he has been having intermittent pains in the upper arm.  This is not previously been mentioned.  There is no bruising in this area.  No prior injuries to his left shoulder.  He does have a history of right shoulder rotator cuff repair.    Review of Systems: No fevers or chills No numbness or  tingling No Chest Pain No shortness of breath   Objective: There were no vitals taken for this visit.  Physical Exam:  Alert and oriented.  No acute distress.  Ambulates with the assistance of a cane.  Left elbow without swelling.  No bruising.  Mild tenderness to palpation around the elbow.  Range of motion from 5-130 degrees.  full supination and pronation.  Left wrist incision is healed.  No surrounding erythema or drainage.  Approximately 80 degree arc of motion in the wrist.  Residual stiffness to the index and long finger.  Some stiffness in the thumb.   Fingers are warm and well-perfused.  Sensation is intact throughout the left hand.  No swelling in the area of tenderness in the upper arm.  He is able to lift his arm above the level of the shoulder.  Sensation intact throughout the upper extremity.  IMAGING: I personally ordered and reviewed the following images:  X-rays of the left wrist were obtained in clinic today.  No acute injuries are noted.  No evidence of hardware failure.  Distal radius fracture remains in good alignment.  There is evidence of callus formation.  Joint line remains neutral.  No intra-articular involvement.  Comminuted fracture of the first metacarpal remains in good alignment.  No bony lesions.  No dislocation.  Impression: Healed left distal radius fracture without hardware failure; healed left first metacarpal shaft fracture   [name], MD 11/21/2023 10:11 PM

## 2023-12-18 ENCOUNTER — Encounter: Admitting: Orthopedic Surgery

## 2024-01-07 ENCOUNTER — Other Ambulatory Visit (INDEPENDENT_AMBULATORY_CARE_PROVIDER_SITE_OTHER): Payer: Self-pay

## 2024-01-07 ENCOUNTER — Ambulatory Visit: Admitting: Orthopedic Surgery

## 2024-01-07 ENCOUNTER — Encounter: Payer: Self-pay | Admitting: Orthopedic Surgery

## 2024-01-07 DIAGNOSIS — M25512 Pain in left shoulder: Secondary | ICD-10-CM

## 2024-01-07 DIAGNOSIS — G8929 Other chronic pain: Secondary | ICD-10-CM | POA: Diagnosis not present

## 2024-01-07 MED ORDER — ONDANSETRON HCL 4 MG PO TABS: 4.0000 mg | ORAL_TABLET | Freq: Three times a day (TID) | ORAL | 0 refills | Status: AC | PRN

## 2024-01-07 NOTE — Patient Instructions (Signed)

## 2024-01-07 NOTE — Progress Notes (Signed)
 Orthopaedic Postop Note  Assessment: Adam Tyler is a 68 y.o. male s/p ORIF of left distal radius fracture, closed treatment of a first metacarpal shaft fracture and closed treatment of left elbow dislocation  DOS: 09/09/2023  Plan: Adam Tyler has done well.  He has no pain or complaints about his left elbow.  Occasional pains in the wrist.  He still struggling with motion of the index and long finger.  Base complaint at this time is his left shoulder.  Pain gets worse at night.  He has difficulty with overhead motion.  Radiographs obtained today are without acute injury.  Mild degenerative changes.  Recommended a steroid injection.  He elected to proceed.  Refill of zofran  provided.   Procedure note injection Left shoulder    Verbal consent was obtained to inject the left shoulder, subacromial space Timeout was completed to confirm the site of injection.  The skin was prepped with alcohol and ethyl chloride was sprayed at the injection site.  A 21-gauge needle was used to inject 40 mg of Depo-Medrol  and 1% lidocaine  (4 cc) into the subacromial space of the left shoulder using a posterolateral approach.  There were no complications. A sterile bandage was applied.    Follow-up: Return if symptoms worsen or fail to improve. XR at next visit: Left wrist and left elbow  Subjective:  Chief Complaint  Patient presents with   Follow-up    Recheck on left wrist and shoulder    History of Present Illness: Adam Tyler is a 68 y.o. male who presents following the above stated procedure.  He is doing much better overall.  Mild pain in the left wrist.  He continues to have restricted motion in the index and long finger.  Biggest complaint at this time is the left shoulder.  It has bothered him since the fall.  Pain gets worse at night.    Review of Systems: No fevers or chills No numbness or tingling No Chest Pain No shortness of breath   Objective: There were no vitals taken for this  visit.  Physical Exam:  Alert and oriented.  No acute distress.  Ambulates with the assistance of a cane.  Left elbow without swelling.  No bruising.  Mild tenderness to palpation around the elbow.  Range of motion from 5-130 degrees.  full supination and pronation.  Left wrist incision is healed.  No surrounding erythema or drainage.  Approximately 80 degree arc of motion in the wrist.  Residual stiffness to the index and long finger.  Some stiffness in the thumb.   Fingers are warm and well-perfused.  Sensation is intact throughout the left hand.  No swelling in the area of tenderness in the upper arm.  Tenderness over the posterior and lateral shoulder.  Pain extends from the superior shoulder to the biceps.  IMAGING: I personally ordered and reviewed the following images:  X-rays of the left shoulder were obtained in clinic today.  No acute injuries are noted.  No dislocation.  Mild loss of joint space.  No evidence of proximal humeral migration.  No chronic injuries.  No bony lesions.  Impression: Negative left shoulder x-ray   Adam DELENA Horde, MD 01/07/2024 12:06 PM

## 2024-05-19 ENCOUNTER — Other Ambulatory Visit (HOSPITAL_BASED_OUTPATIENT_CLINIC_OR_DEPARTMENT_OTHER): Payer: Self-pay

## 2024-05-19 MED ORDER — PANTOPRAZOLE SODIUM 40 MG PO TBEC
40.0000 mg | DELAYED_RELEASE_TABLET | Freq: Every morning | ORAL | 3 refills | Status: AC
  Filled 2024-05-23: qty 90, 90d supply, fill #0

## 2024-05-19 MED ORDER — ATORVASTATIN CALCIUM 20 MG PO TABS
20.0000 mg | ORAL_TABLET | Freq: Every day | ORAL | 3 refills | Status: AC
  Filled 2024-05-23: qty 90, 90d supply, fill #0

## 2024-05-19 MED ORDER — TAMSULOSIN HCL 0.4 MG PO CAPS
0.4000 mg | ORAL_CAPSULE | Freq: Every day | ORAL | 2 refills | Status: AC
  Filled 2024-05-23: qty 100, 100d supply, fill #0

## 2024-05-19 MED ORDER — METFORMIN HCL ER 500 MG PO TB24
1000.0000 mg | ORAL_TABLET | Freq: Two times a day (BID) | ORAL | 3 refills | Status: AC
  Filled 2024-05-23: qty 360, 90d supply, fill #0

## 2024-05-19 MED ORDER — GLUCOSE BLOOD VI STRP
ORAL_STRIP | 5 refills | Status: AC
  Filled 2024-05-23: qty 100, 50d supply, fill #0

## 2024-05-19 MED ORDER — CARVEDILOL 6.25 MG PO TABS
6.2500 mg | ORAL_TABLET | Freq: Two times a day (BID) | ORAL | 2 refills | Status: AC
  Filled 2024-05-23: qty 200, 100d supply, fill #0

## 2024-05-19 MED ORDER — VENLAFAXINE HCL ER 75 MG PO CP24
75.0000 mg | ORAL_CAPSULE | Freq: Every day | ORAL | 3 refills | Status: AC
  Filled 2024-05-23: qty 90, 90d supply, fill #0

## 2024-05-19 MED ORDER — FLUTICASONE PROPIONATE 50 MCG/ACT NA SUSP
1.0000 | Freq: Every day | NASAL | 5 refills | Status: AC
  Filled 2024-05-23: qty 16, 60d supply, fill #0

## 2024-05-19 MED ORDER — ONETOUCH DELICA PLUS LANCET33G MISC
6 refills | Status: AC
  Filled 2024-05-23: qty 100, 50d supply, fill #0

## 2024-05-19 MED ORDER — GLIPIZIDE 10 MG PO TABS
10.0000 mg | ORAL_TABLET | Freq: Two times a day (BID) | ORAL | 3 refills | Status: AC
  Filled 2024-05-23: qty 180, 90d supply, fill #0

## 2024-05-19 MED ORDER — OLMESARTAN MEDOXOMIL 40 MG PO TABS
40.0000 mg | ORAL_TABLET | Freq: Every day | ORAL | 1 refills | Status: AC
  Filled 2024-05-23: qty 90, 90d supply, fill #0

## 2024-05-19 MED ORDER — HYDROCHLOROTHIAZIDE 25 MG PO TABS
25.0000 mg | ORAL_TABLET | Freq: Every day | ORAL | 1 refills | Status: AC
  Filled 2024-05-23: qty 80, 80d supply, fill #0

## 2024-05-23 ENCOUNTER — Other Ambulatory Visit (HOSPITAL_COMMUNITY): Payer: Self-pay

## 2024-05-24 ENCOUNTER — Other Ambulatory Visit (HOSPITAL_COMMUNITY): Payer: Self-pay

## 2024-05-25 ENCOUNTER — Other Ambulatory Visit (HOSPITAL_BASED_OUTPATIENT_CLINIC_OR_DEPARTMENT_OTHER): Payer: Self-pay

## 2024-05-25 ENCOUNTER — Other Ambulatory Visit (HOSPITAL_COMMUNITY): Payer: Self-pay

## 2024-05-25 ENCOUNTER — Other Ambulatory Visit: Payer: Self-pay

## 2024-06-01 ENCOUNTER — Other Ambulatory Visit (HOSPITAL_COMMUNITY): Payer: Self-pay

## 2024-06-02 ENCOUNTER — Other Ambulatory Visit (HOSPITAL_BASED_OUTPATIENT_CLINIC_OR_DEPARTMENT_OTHER): Payer: Self-pay

## 2024-06-02 MED ORDER — GLIPIZIDE 10 MG PO TABS: 10.0000 mg | ORAL_TABLET | Freq: Two times a day (BID) | ORAL | 3 refills | Status: AC

## 2024-10-27 ENCOUNTER — Other Ambulatory Visit

## 2024-11-11 ENCOUNTER — Ambulatory Visit: Admitting: Urology
